# Patient Record
Sex: Male | Born: 1940
Health system: Southern US, Community
[De-identification: ages and names within clinical notes are randomized; demographics above are authoritative.]

## PROBLEM LIST (undated history)

## (undated) DIAGNOSIS — I517 Cardiomegaly: Secondary | ICD-10-CM

## (undated) DIAGNOSIS — K219 Gastro-esophageal reflux disease without esophagitis: Secondary | ICD-10-CM

## (undated) DIAGNOSIS — L301 Dyshidrosis [pompholyx]: Secondary | ICD-10-CM

## (undated) DIAGNOSIS — J45909 Unspecified asthma, uncomplicated: Secondary | ICD-10-CM

## (undated) DIAGNOSIS — N401 Enlarged prostate with lower urinary tract symptoms: Secondary | ICD-10-CM

## (undated) DIAGNOSIS — K5730 Diverticulosis of large intestine without perforation or abscess without bleeding: Secondary | ICD-10-CM

## (undated) DIAGNOSIS — I872 Venous insufficiency (chronic) (peripheral): Secondary | ICD-10-CM

## (undated) DIAGNOSIS — K648 Other hemorrhoids: Secondary | ICD-10-CM

## (undated) DIAGNOSIS — M5382 Other specified dorsopathies, cervical region: Secondary | ICD-10-CM

## (undated) DIAGNOSIS — M1990 Unspecified osteoarthritis, unspecified site: Secondary | ICD-10-CM

## (undated) DIAGNOSIS — E785 Hyperlipidemia, unspecified: Secondary | ICD-10-CM

## (undated) DIAGNOSIS — I6529 Occlusion and stenosis of unspecified carotid artery: Secondary | ICD-10-CM

## (undated) DIAGNOSIS — I2510 Atherosclerotic heart disease of native coronary artery without angina pectoris: Secondary | ICD-10-CM

## (undated) DIAGNOSIS — I10 Essential (primary) hypertension: Secondary | ICD-10-CM

## (undated) DIAGNOSIS — J309 Allergic rhinitis, unspecified: Secondary | ICD-10-CM

## (undated) DIAGNOSIS — E669 Obesity, unspecified: Secondary | ICD-10-CM

## (undated) HISTORY — DX: Unspecified asthma, uncomplicated: J45.909

## (undated) HISTORY — PX: FOOT SURGERY: SHX648

## (undated) HISTORY — DX: Diverticulosis of large intestine without perforation or abscess without bleeding: K57.30

## (undated) HISTORY — DX: Other hemorrhoids: K64.8

## (undated) HISTORY — DX: Unspecified osteoarthritis, unspecified site: M19.90

## (undated) HISTORY — DX: Gastro-esophageal reflux disease without esophagitis: K21.9

## (undated) HISTORY — PX: TONSILLECTOMY: SHX5217

## (undated) HISTORY — DX: Cardiomegaly: I51.7

## (undated) HISTORY — DX: Dyshidrosis (pompholyx): L30.1

## (undated) HISTORY — PX: JOINT REPLACEMENT: SHX530

## (undated) HISTORY — DX: Benign prostatic hyperplasia with lower urinary tract symptoms: N40.1

## (undated) HISTORY — DX: Allergic rhinitis, unspecified: J30.9

## (undated) HISTORY — DX: Obesity, unspecified: E66.9

## (undated) HISTORY — DX: Venous insufficiency (chronic) (peripheral): I87.2

## (undated) HISTORY — PX: ANGIOPLASTY: SHX39

## (undated) HISTORY — DX: Hyperlipidemia, unspecified: E78.5

## (undated) HISTORY — DX: Essential (primary) hypertension: I10

## (undated) HISTORY — DX: Other specified dorsopathies, cervical region: M53.82

## (undated) HISTORY — DX: Occlusion and stenosis of unspecified carotid artery: I65.29

## (undated) HISTORY — DX: Atherosclerotic heart disease of native coronary artery without angina pectoris: I25.10

---

## 1997-08-26 ENCOUNTER — Encounter: Admission: RE | Admit: 1997-08-26 | Discharge: 1997-11-24 | Payer: Self-pay

## 1998-11-01 ENCOUNTER — Inpatient Hospital Stay (HOSPITAL_COMMUNITY): Admission: AD | Admit: 1998-11-01 | Discharge: 1998-11-03 | Payer: Self-pay | Admitting: Cardiovascular Disease

## 1998-11-07 ENCOUNTER — Emergency Department (HOSPITAL_COMMUNITY): Admission: EM | Admit: 1998-11-07 | Discharge: 1998-11-07 | Payer: Self-pay | Admitting: Emergency Medicine

## 1999-05-06 ENCOUNTER — Inpatient Hospital Stay (HOSPITAL_COMMUNITY): Admission: AD | Admit: 1999-05-06 | Discharge: 1999-05-10 | Payer: Self-pay | Admitting: Cardiology

## 1999-05-24 ENCOUNTER — Ambulatory Visit (HOSPITAL_BASED_OUTPATIENT_CLINIC_OR_DEPARTMENT_OTHER): Admission: RE | Admit: 1999-05-24 | Discharge: 1999-05-24 | Payer: Self-pay | Admitting: Orthopedic Surgery

## 1999-09-22 ENCOUNTER — Ambulatory Visit (HOSPITAL_COMMUNITY): Admission: RE | Admit: 1999-09-22 | Discharge: 1999-09-23 | Payer: Self-pay | Admitting: Cardiology

## 2000-07-18 ENCOUNTER — Ambulatory Visit (HOSPITAL_COMMUNITY): Admission: RE | Admit: 2000-07-18 | Discharge: 2000-07-18 | Payer: Self-pay | Admitting: Cardiology

## 2001-03-27 LAB — HM COLONOSCOPY

## 2001-05-20 ENCOUNTER — Encounter: Payer: Self-pay | Admitting: Gastroenterology

## 2001-05-20 DIAGNOSIS — K5730 Diverticulosis of large intestine without perforation or abscess without bleeding: Secondary | ICD-10-CM

## 2001-05-20 DIAGNOSIS — K648 Other hemorrhoids: Secondary | ICD-10-CM | POA: Insufficient documentation

## 2001-05-20 HISTORY — DX: Diverticulosis of large intestine without perforation or abscess without bleeding: K57.30

## 2001-05-20 HISTORY — DX: Other hemorrhoids: K64.8

## 2001-12-20 ENCOUNTER — Encounter: Admission: RE | Admit: 2001-12-20 | Discharge: 2001-12-20 | Payer: Self-pay | Admitting: Family Medicine

## 2001-12-20 ENCOUNTER — Encounter: Payer: Self-pay | Admitting: Family Medicine

## 2003-05-29 ENCOUNTER — Encounter: Admission: RE | Admit: 2003-05-29 | Discharge: 2003-05-29 | Payer: Self-pay | Admitting: Family Medicine

## 2004-02-10 ENCOUNTER — Ambulatory Visit: Payer: Self-pay | Admitting: Family Medicine

## 2004-05-24 ENCOUNTER — Ambulatory Visit: Payer: Self-pay | Admitting: Family Medicine

## 2005-02-02 ENCOUNTER — Ambulatory Visit: Payer: Self-pay | Admitting: Family Medicine

## 2005-03-24 ENCOUNTER — Ambulatory Visit: Payer: Self-pay | Admitting: Family Medicine

## 2005-03-28 ENCOUNTER — Ambulatory Visit: Payer: Self-pay | Admitting: Family Medicine

## 2005-05-01 ENCOUNTER — Ambulatory Visit: Payer: Self-pay | Admitting: Family Medicine

## 2005-06-27 ENCOUNTER — Ambulatory Visit: Payer: Self-pay | Admitting: Internal Medicine

## 2005-07-11 ENCOUNTER — Ambulatory Visit: Payer: Self-pay | Admitting: Cardiology

## 2005-07-11 ENCOUNTER — Ambulatory Visit: Payer: Self-pay | Admitting: Family Medicine

## 2005-07-11 ENCOUNTER — Observation Stay (HOSPITAL_COMMUNITY): Admission: AD | Admit: 2005-07-11 | Discharge: 2005-07-13 | Payer: Self-pay | Admitting: Family Medicine

## 2005-07-13 ENCOUNTER — Ambulatory Visit: Payer: Self-pay | Admitting: Family Medicine

## 2005-07-17 ENCOUNTER — Ambulatory Visit: Payer: Self-pay | Admitting: Family Medicine

## 2006-02-07 ENCOUNTER — Ambulatory Visit: Payer: Self-pay | Admitting: Family Medicine

## 2006-04-12 ENCOUNTER — Ambulatory Visit: Payer: Self-pay | Admitting: Family Medicine

## 2006-04-12 LAB — CONVERTED CEMR LAB
AST: 23 units/L (ref 0–37)
Albumin: 3.7 g/dL (ref 3.5–5.2)
Alkaline Phosphatase: 58 units/L (ref 39–117)
BUN: 18 mg/dL (ref 6–23)
Cholesterol: 161 mg/dL (ref 0–200)
Creatinine, Ser: 0.9 mg/dL (ref 0.4–1.5)
Eosinophils Relative: 2 % (ref 0.0–5.0)
HCT: 45.5 % (ref 39.0–52.0)
Hemoglobin: 14.3 g/dL (ref 13.0–17.0)
Hgb A1c MFr Bld: 5.9 % (ref 4.6–6.0)
Neutrophils Relative %: 47.9 % (ref 43.0–77.0)
PSA: 0.26 ng/mL (ref 0.10–4.00)
Potassium: 3.9 meq/L (ref 3.5–5.1)
RDW: 12.6 % (ref 11.5–14.6)
Total Bilirubin: 1 mg/dL (ref 0.3–1.2)
Total Protein: 6.7 g/dL (ref 6.0–8.3)
Triglycerides: 78 mg/dL (ref 0–149)

## 2006-04-19 ENCOUNTER — Ambulatory Visit: Payer: Self-pay | Admitting: Family Medicine

## 2006-06-01 ENCOUNTER — Ambulatory Visit: Payer: Self-pay | Admitting: Family Medicine

## 2006-12-03 DIAGNOSIS — I2510 Atherosclerotic heart disease of native coronary artery without angina pectoris: Secondary | ICD-10-CM

## 2006-12-03 DIAGNOSIS — E785 Hyperlipidemia, unspecified: Secondary | ICD-10-CM

## 2006-12-03 DIAGNOSIS — I10 Essential (primary) hypertension: Secondary | ICD-10-CM

## 2006-12-03 HISTORY — DX: Hyperlipidemia, unspecified: E78.5

## 2006-12-03 HISTORY — DX: Atherosclerotic heart disease of native coronary artery without angina pectoris: I25.10

## 2006-12-04 DIAGNOSIS — I872 Venous insufficiency (chronic) (peripheral): Secondary | ICD-10-CM

## 2006-12-04 HISTORY — DX: Venous insufficiency (chronic) (peripheral): I87.2

## 2006-12-27 DIAGNOSIS — J45909 Unspecified asthma, uncomplicated: Secondary | ICD-10-CM | POA: Insufficient documentation

## 2007-01-03 ENCOUNTER — Ambulatory Visit: Payer: Self-pay | Admitting: Family Medicine

## 2007-01-10 ENCOUNTER — Ambulatory Visit: Payer: Self-pay | Admitting: Family Medicine

## 2007-01-11 ENCOUNTER — Ambulatory Visit: Payer: Self-pay | Admitting: Gastroenterology

## 2007-02-03 ENCOUNTER — Ambulatory Visit: Payer: Self-pay | Admitting: Internal Medicine

## 2007-02-05 ENCOUNTER — Ambulatory Visit: Payer: Self-pay | Admitting: Gastroenterology

## 2007-02-05 ENCOUNTER — Encounter: Payer: Self-pay | Admitting: Family Medicine

## 2007-03-20 ENCOUNTER — Ambulatory Visit: Payer: Self-pay | Admitting: Internal Medicine

## 2007-03-20 DIAGNOSIS — J209 Acute bronchitis, unspecified: Secondary | ICD-10-CM | POA: Insufficient documentation

## 2007-04-22 ENCOUNTER — Ambulatory Visit: Payer: Self-pay | Admitting: Family Medicine

## 2007-04-22 DIAGNOSIS — J309 Allergic rhinitis, unspecified: Secondary | ICD-10-CM

## 2007-04-22 HISTORY — DX: Allergic rhinitis, unspecified: J30.9

## 2007-05-30 DIAGNOSIS — I517 Cardiomegaly: Secondary | ICD-10-CM

## 2007-05-30 HISTORY — DX: Cardiomegaly: I51.7

## 2007-06-24 ENCOUNTER — Encounter: Payer: Self-pay | Admitting: Family Medicine

## 2007-06-25 ENCOUNTER — Ambulatory Visit: Payer: Self-pay | Admitting: Family Medicine

## 2007-06-25 DIAGNOSIS — M1990 Unspecified osteoarthritis, unspecified site: Secondary | ICD-10-CM | POA: Insufficient documentation

## 2007-06-25 DIAGNOSIS — N401 Enlarged prostate with lower urinary tract symptoms: Secondary | ICD-10-CM

## 2007-06-25 DIAGNOSIS — L301 Dyshidrosis [pompholyx]: Secondary | ICD-10-CM

## 2007-06-25 DIAGNOSIS — N138 Other obstructive and reflux uropathy: Secondary | ICD-10-CM | POA: Insufficient documentation

## 2007-06-25 HISTORY — DX: Other obstructive and reflux uropathy: N13.8

## 2007-06-25 HISTORY — DX: Dyshidrosis (pompholyx): L30.1

## 2007-06-25 HISTORY — DX: Unspecified osteoarthritis, unspecified site: M19.90

## 2007-06-25 HISTORY — DX: Benign prostatic hyperplasia with lower urinary tract symptoms: N40.1

## 2007-06-25 LAB — CONVERTED CEMR LAB
Albumin: 3.7 g/dL (ref 3.5–5.2)
Bilirubin Urine: NEGATIVE
Bilirubin, Direct: 0.1 mg/dL (ref 0.0–0.3)
Calcium: 9.5 mg/dL (ref 8.4–10.5)
Cholesterol: 138 mg/dL (ref 0–200)
Creatinine, Ser: 0.8 mg/dL (ref 0.4–1.5)
GFR calc Af Amer: 124 mL/min
Glucose, Bld: 95 mg/dL (ref 70–99)
Glucose, Urine, Semiquant: NEGATIVE
HCT: 45.7 % (ref 39.0–52.0)
Hemoglobin: 14.7 g/dL (ref 13.0–17.0)
Ketones, urine, test strip: NEGATIVE
MCHC: 32.2 g/dL (ref 30.0–36.0)
Monocytes Absolute: 1 10*3/uL (ref 0.1–1.0)
Monocytes Relative: 13.4 % — ABNORMAL HIGH (ref 3.0–12.0)
Neutro Abs: 4.1 10*3/uL (ref 1.4–7.7)
PSA: 0.07 ng/mL — ABNORMAL LOW (ref 0.10–4.00)
RDW: 13.1 % (ref 11.5–14.6)
Sodium: 140 meq/L (ref 135–145)
TSH: 1.15 microintl units/mL (ref 0.35–5.50)
Total CHOL/HDL Ratio: 3
Total Protein: 6.5 g/dL (ref 6.0–8.3)
Triglycerides: 68 mg/dL (ref 0–149)
pH: 5.5

## 2007-06-27 ENCOUNTER — Telehealth (INDEPENDENT_AMBULATORY_CARE_PROVIDER_SITE_OTHER): Payer: Self-pay | Admitting: *Deleted

## 2007-07-02 ENCOUNTER — Telehealth: Payer: Self-pay | Admitting: Family Medicine

## 2007-08-13 ENCOUNTER — Telehealth: Payer: Self-pay | Admitting: Family Medicine

## 2007-10-02 ENCOUNTER — Ambulatory Visit: Payer: Self-pay | Admitting: Cardiology

## 2007-10-10 ENCOUNTER — Ambulatory Visit: Payer: Self-pay

## 2007-10-10 LAB — CONVERTED CEMR LAB
Calcium: 9.1 mg/dL (ref 8.4–10.5)
GFR calc Af Amer: 108 mL/min
GFR calc non Af Amer: 89 mL/min
Sodium: 136 meq/L (ref 135–145)

## 2007-10-22 ENCOUNTER — Ambulatory Visit: Payer: Self-pay | Admitting: Cardiology

## 2007-10-22 LAB — CONVERTED CEMR LAB
Calcium: 9.3 mg/dL (ref 8.4–10.5)
GFR calc Af Amer: 108 mL/min
GFR calc non Af Amer: 89 mL/min
Potassium: 4.2 meq/L (ref 3.5–5.1)
Sodium: 139 meq/L (ref 135–145)

## 2007-11-06 ENCOUNTER — Ambulatory Visit: Payer: Self-pay | Admitting: Pulmonary Disease

## 2007-11-06 DIAGNOSIS — R0609 Other forms of dyspnea: Secondary | ICD-10-CM

## 2007-11-06 DIAGNOSIS — R0989 Other specified symptoms and signs involving the circulatory and respiratory systems: Secondary | ICD-10-CM

## 2007-12-06 ENCOUNTER — Ambulatory Visit: Payer: Self-pay | Admitting: Internal Medicine

## 2007-12-06 DIAGNOSIS — K219 Gastro-esophageal reflux disease without esophagitis: Secondary | ICD-10-CM | POA: Insufficient documentation

## 2007-12-06 HISTORY — DX: Gastro-esophageal reflux disease without esophagitis: K21.9

## 2007-12-25 ENCOUNTER — Ambulatory Visit: Payer: Self-pay | Admitting: Family Medicine

## 2008-01-22 ENCOUNTER — Ambulatory Visit: Payer: Self-pay | Admitting: Family Medicine

## 2008-02-10 ENCOUNTER — Ambulatory Visit: Payer: Self-pay | Admitting: Cardiology

## 2008-05-14 ENCOUNTER — Ambulatory Visit: Payer: Self-pay | Admitting: Family Medicine

## 2008-06-18 ENCOUNTER — Ambulatory Visit: Payer: Self-pay | Admitting: Family Medicine

## 2008-06-18 LAB — CONVERTED CEMR LAB
ALT: 21 units/L (ref 0–53)
Albumin: 3.6 g/dL (ref 3.5–5.2)
BUN: 18 mg/dL (ref 6–23)
Basophils Relative: 0.1 % (ref 0.0–3.0)
Bilirubin Urine: NEGATIVE
Bilirubin, Direct: 0 mg/dL (ref 0.0–0.3)
CO2: 29 meq/L (ref 19–32)
Chloride: 104 meq/L (ref 96–112)
Cholesterol: 157 mg/dL (ref 0–200)
Creatinine, Ser: 0.8 mg/dL (ref 0.4–1.5)
Eosinophils Absolute: 0.2 10*3/uL (ref 0.0–0.7)
Eosinophils Relative: 2.5 % (ref 0.0–5.0)
HCT: 43.6 % (ref 39.0–52.0)
Ketones, urine, test strip: NEGATIVE
Lymphs Abs: 2.2 10*3/uL (ref 0.7–4.0)
MCHC: 33.8 g/dL (ref 30.0–36.0)
MCV: 87.5 fL (ref 78.0–100.0)
Monocytes Absolute: 0.8 10*3/uL (ref 0.1–1.0)
Neutrophils Relative %: 49.1 % (ref 43.0–77.0)
PSA: 0.26 ng/mL (ref 0.10–4.00)
Potassium: 4.1 meq/L (ref 3.5–5.1)
RBC: 4.98 M/uL (ref 4.22–5.81)
Specific Gravity, Urine: 1.02
TSH: 0.61 microintl units/mL (ref 0.35–5.50)
Total CHOL/HDL Ratio: 3
Total Protein: 6.7 g/dL (ref 6.0–8.3)
Triglycerides: 68 mg/dL (ref 0.0–149.0)
WBC: 6.2 10*3/uL (ref 4.5–10.5)

## 2008-07-23 ENCOUNTER — Encounter: Payer: Self-pay | Admitting: Cardiology

## 2008-07-23 ENCOUNTER — Ambulatory Visit: Payer: Self-pay | Admitting: Family Medicine

## 2008-12-05 DIAGNOSIS — E669 Obesity, unspecified: Secondary | ICD-10-CM

## 2008-12-05 HISTORY — DX: Obesity, unspecified: E66.9

## 2008-12-22 ENCOUNTER — Ambulatory Visit: Payer: Self-pay | Admitting: Cardiology

## 2009-02-04 ENCOUNTER — Ambulatory Visit: Payer: Self-pay | Admitting: Cardiology

## 2009-02-10 ENCOUNTER — Telehealth: Payer: Self-pay | Admitting: Cardiology

## 2009-02-10 LAB — CONVERTED CEMR LAB
ALT: 23 units/L (ref 0–53)
AST: 24 units/L (ref 0–37)
Alkaline Phosphatase: 52 units/L (ref 39–117)
Bilirubin, Direct: 0 mg/dL (ref 0.0–0.3)
Cholesterol: 158 mg/dL (ref 0–200)
Total Bilirubin: 1.2 mg/dL (ref 0.3–1.2)
Total Protein: 6.9 g/dL (ref 6.0–8.3)

## 2009-07-22 ENCOUNTER — Ambulatory Visit: Payer: Self-pay | Admitting: Family Medicine

## 2009-07-22 LAB — CONVERTED CEMR LAB
ALT: 20 units/L (ref 0–53)
AST: 24 units/L (ref 0–37)
BUN: 27 mg/dL — ABNORMAL HIGH (ref 6–23)
Basophils Relative: 0.2 % (ref 0.0–3.0)
Bilirubin Urine: NEGATIVE
Bilirubin, Direct: 0.1 mg/dL (ref 0.0–0.3)
Cholesterol: 164 mg/dL (ref 0–200)
Creatinine, Ser: 1.1 mg/dL (ref 0.4–1.5)
Eosinophils Relative: 3.6 % (ref 0.0–5.0)
GFR calc non Af Amer: 70.13 mL/min (ref >60)
HCT: 42.8 % (ref 39.0–52.0)
LDL Cholesterol: 90 mg/dL (ref 0–99)
Monocytes Relative: 13.1 % — ABNORMAL HIGH (ref 3.0–12.0)
Neutrophils Relative %: 51.2 % (ref 43.0–77.0)
Nitrite: NEGATIVE
Platelets: 151 10*3/uL (ref 150.0–400.0)
Potassium: 4.7 meq/L (ref 3.5–5.1)
Protein, U semiquant: NEGATIVE
RBC: 4.79 M/uL (ref 4.22–5.81)
Total Bilirubin: 0.8 mg/dL (ref 0.3–1.2)
Total CHOL/HDL Ratio: 3
Total Protein: 6.7 g/dL (ref 6.0–8.3)
Urobilinogen, UA: 0.2
VLDL: 19.4 mg/dL (ref 0.0–40.0)
WBC: 6.9 10*3/uL (ref 4.5–10.5)

## 2009-07-30 ENCOUNTER — Ambulatory Visit: Payer: Self-pay | Admitting: Family Medicine

## 2009-07-30 LAB — CONVERTED CEMR LAB: HDL goal, serum: 40 mg/dL

## 2009-10-14 ENCOUNTER — Ambulatory Visit: Payer: Self-pay | Admitting: Family Medicine

## 2009-10-14 DIAGNOSIS — M5382 Other specified dorsopathies, cervical region: Secondary | ICD-10-CM

## 2009-10-14 HISTORY — DX: Other specified dorsopathies, cervical region: M53.82

## 2009-10-21 ENCOUNTER — Encounter
Admission: RE | Admit: 2009-10-21 | Discharge: 2009-12-17 | Payer: Self-pay | Source: Home / Self Care | Admitting: Family Medicine

## 2009-10-25 ENCOUNTER — Encounter: Payer: Self-pay | Admitting: Family Medicine

## 2009-11-18 ENCOUNTER — Ambulatory Visit: Payer: Self-pay | Admitting: Cardiology

## 2009-12-27 ENCOUNTER — Ambulatory Visit: Payer: Self-pay | Admitting: Cardiology

## 2010-01-28 ENCOUNTER — Telehealth: Payer: Self-pay | Admitting: Cardiology

## 2010-01-28 LAB — CONVERTED CEMR LAB
ALT: 19 units/L (ref 0–53)
Alkaline Phosphatase: 54 units/L (ref 39–117)
Bilirubin, Direct: 0.1 mg/dL (ref 0.0–0.3)
HDL: 49.3 mg/dL (ref >39.00)
LDL Cholesterol: 105 mg/dL — ABNORMAL HIGH (ref 0–99)
Total Bilirubin: 0.8 mg/dL (ref 0.3–1.2)
Total CHOL/HDL Ratio: 4
Total Protein: 6.7 g/dL (ref 6.0–8.3)
Triglycerides: 154 mg/dL — ABNORMAL HIGH (ref 0.0–149.0)

## 2010-03-15 ENCOUNTER — Ambulatory Visit: Payer: Self-pay | Admitting: Family Medicine

## 2010-04-22 ENCOUNTER — Telehealth: Payer: Self-pay | Admitting: Family Medicine

## 2010-04-25 ENCOUNTER — Telehealth: Payer: Self-pay | Admitting: Cardiology

## 2010-04-26 NOTE — Assessment & Plan Note (Signed)
Summary: neck pain 6 month/njr   Vital Signs:  Patient profile:   70 year old male Weight:      252 pounds Temp:     97.7 degrees F oral BP sitting:   100 / 70  (left arm) Cuff size:   regular  Vitals Entered By: Kathrynn Speed CMA (October 14, 2009 10:28 AM) CC: Neck pain for 2 months, src   CC:  Neck pain for 2 months and src.  History of Present Illness: Hunter Young is a 70 year old, married male, nonsmoker, who comes in today for evaluation of left neck pain x 6 months.  Impression 6 months ago he began having pain in the left side of his neck.  Now the pain is constant and varies in type from sharp to dull pain.  A3 on a scale of one to 10.  Occasionally it will radiate down his left arm.  No history of trauma to  Current Medications (verified): 1)  Toprol Xl 50 Mg  Tb24 (Metoprolol Succinate) .... 1/2 Every Morning 2)  Nitroglycerin 0.4 Mg  Subl (Nitroglycerin) .... As Needed 3)  Centrum Silver   Tabs (Multiple Vitamins-Minerals) .... Once Daily 4)  Omeprazole 20 Mg Cpdr (Omeprazole) .... Take One Tablet By Mouth Daily 5)  Adult Aspirin Ec Low Strength 81 Mg Tbec (Aspirin) .... Take One Tab Once Daily 6)  Oxybutynin Chloride 5 Mg Tabs (Oxybutynin Chloride) .... Take 1 Tablet By Mouth Two Times A Day 7)  Niacin 500 Mg Tabs (Niacin) .... Once Daily 8)  Lisinopril 40 Mg Tabs (Lisinopril) .... Take One Tablet By Mouth Daily 9)  Simvastatin 80 Mg Tabs (Simvastatin) .... Take One-Half  Tablet By Mouth Daily At Bedtime  Allergies (verified): No Known Drug Allergies  Past History:  Past medical, surgical, family and social histories (including risk factors) reviewed for relevance to current acute and chronic problems.  Past Medical History: Reviewed history from 12/05/2008 and no changes required. Current Problems:  CORONARY ARTERY DISEASE (ICD-414.00)-Nonobstructive HYPERLIPIDEMIA (ICD-272.4) HYPERTENSION (ICD-401.9) CARDIOMEGALY (ICD-429.3) GERD (ICD-530.81) SNORING  (ICD-786.09) OBESITY, MODERATE (ICD-278.00) DYSHIDROSIS (ICD-705.81) BENIGN PROSTATIC HYPERTROPHY, WITH URINARY OBSTRUCTION (ICD-600.01) OSTEOARTHRITIS (ICD-715.90) NEOPLASM, MALIGNANT, COLON, FAMILY HX, MOTHER (ICD-V16.0) INTERNAL HEMORRHOIDS (ICD-455.0) DIVERTICULOSIS OF COLON (ICD-562.10) ALLERGIC RHINITIS (ICD-477.9) BRONCHITIS, ACUTE (ICD-466.0) ASTHMA, INTRINSIC NOS (ICD-493.10) VENOUS INSUFFICIENCY (ICD-459.81)  Past Surgical History: Reviewed history from 12/05/2008 and no changes required.  bilateral knee replacement  foot surgery.  tonsillectomy.  prior PCI of his circumflex multiple times.  Family History: Reviewed history from 12/05/2008 and no changes required.   Positive for coronary artery disease.  Social History: Reviewed history from 12/05/2008 and no changes required. 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 Pt previously worked in the trucking business.  Married Former Smoker Alcohol use-no Drug use-no Regular exercise-no  Review of Systems      See HPI  Physical Exam  General:  Well-developed,well-nourished,in no acute distress; alert,appropriate and cooperative throughout examination Msk:  No deformity or scoliosis noted of thoracic or lumbar spine.   Pulses:  R and L carotid,radial,femoral,dorsalis pedis and posterior tibial pulses are full and equal bilaterally Extremities:  No clubbing, cyanosis, edema, or deformity noted with normal full range of motion of all joints.   Neurologic:  No cranial nerve deficits noted. Station and gait are normal. Plantar reflexes are down-going bilaterally. DTRs are symmetrical throughout. Sensory, motor and coordinative functions appear intact.   Problems:  Medical Problems Added: 1)  Dx of Cervical  Disorder, Nos  (ICD-723.9)  Impression & Recommendations:  Problem # 1:  CERVICAL DISORDER, NOS (ICD-723.9) Assessment New  Orders: Physical Therapy Referral  (PT) T-Cervical Spine Comp w/Flex & Ext ([id number])  Complete Medication List: 1)  Toprol Xl 50 Mg Tb24 (Metoprolol succinate) .... 1/2 every morning 2)  Nitroglycerin 0.4 Mg Subl (Nitroglycerin) .... As needed 3)  Centrum Silver Tabs (Multiple vitamins-minerals) .... Once daily 4)  Omeprazole 20 Mg Cpdr (Omeprazole) .... Take one tablet by mouth daily 5)  Adult Aspirin Ec Low Strength 81 Mg Tbec (Aspirin) .... Take one tab once daily 6)  Oxybutynin Chloride 5 Mg Tabs (Oxybutynin chloride) .... Take 1 tablet by mouth two times a day 7)  Niacin 500 Mg Tabs (Niacin) .... Once daily 8)  Lisinopril 40 Mg Tabs (Lisinopril) .... Take one tablet by mouth daily 9)  Simvastatin 80 Mg Tabs (Simvastatin) .... Take one-half  tablet by mouth daily at bedtime 10)  Flexeril 5 Mg Tabs (Cyclobenzaprine hcl) .Marland Kitchen.. 1 tab @ bedtime 11)  Vicodin Es 7.5-750 Mg Tabs (Hydrocodone-acetaminophen) .Marland Kitchen.. 1 tab @ bedtime  Patient Instructions: 1)  go  to the main office now for x-rays of your neck.  I will call you the report. 2)  purchase a soft cervical collar...Marland KitchenMarland KitchenMarland Kitchen wear it at bedtime....and take a half of Flexeril, and/or Vicodin at bedtime as needed for severe neck pain. 3)  We will also get u  set up for physical therapy Prescriptions: VICODIN ES 7.5-750 MG TABS (HYDROCODONE-ACETAMINOPHEN) 1 tab @ bedtime  #30 x 2   Entered and Authorized by:   Roderick Pee MD   Signed by:   Roderick Pee MD on 10/14/2009   Method used:   Print then Give to Patient   RxID:   [id number] FLEXERIL 5 MG TABS (CYCLOBENZAPRINE HCL) 1 tab @ bedtime  #30 x 2   Entered and Authorized by:   Roderick Pee MD   Signed by:   Roderick Pee MD on 10/14/2009   Method used:   Print then Give to Patient   RxID:   [id number]

## 2010-04-26 NOTE — Miscellaneous (Signed)
Summary: Initial Summary for PT Services/[facility]  Initial Summary for PT Services/[facility]   Imported By: Maryln Gottron 10/27/2009 09:05:03  _____________________________________________________________________  External Attachment:    Type:   Image     Comment:   External Document

## 2010-04-26 NOTE — Assessment & Plan Note (Signed)
Summary: f81m   Visit Type:  6 months follow up Referring Provider:  Shawnie Pons Primary Provider:  Roderick Pee MD  CC:  One episode of chest pain about 2 months ago.Marland Kitchen  History of Present Illness: Had one episode about two months ago, and none since.  He thinks he over did it.  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  He got very hot and it felt funny.  Had no unusual swelling in legs.  He may go to another company.  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    Current Medications (verified): 1)  Toprol Xl 50 Mg  Tb24 (Metoprolol Succinate) .... 1/2 Every Morning 2)  Nitroglycerin 0.4 Mg  Subl (Nitroglycerin) .... As Needed 3)  Centrum Silver   Tabs (Multiple Vitamins-Minerals) .... Once Daily 4)  Omeprazole 20 Mg Cpdr (Omeprazole) .... Take One Tablet By Mouth Daily 5)  Adult Aspirin Ec Low Strength 81 Mg Tbec (Aspirin) .... Take One Tab Once Daily 6)  Oxybutynin Chloride 5 Mg Tabs (Oxybutynin Chloride) .... Take 1 Tablet By Mouth Two Times A Day 7)  Niacin 500 Mg Tabs (Niacin) .... Once Daily 8)  Lisinopril 40 Mg Tabs (Lisinopril) .... Take One Tablet By Mouth Daily 9)  Simvastatin 80 Mg Tabs (Simvastatin) .... Take One-Half  Tablet By Mouth Daily At Bedtime  Allergies (verified): No Known Drug Allergies  Past History:  Past Medical History: Last updated: 12/05/2008 Current Problems:  CORONARY ARTERY DISEASE (ICD-414.00)-Nonobstructive HYPERLIPIDEMIA (ICD-272.4) HYPERTENSION (ICD-401.9) CARDIOMEGALY (ICD-429.3) GERD (ICD-530.81) SNORING (ICD-786.09) OBESITY, MODERATE (ICD-278.00) DYSHIDROSIS (ICD-705.81) BENIGN PROSTATIC HYPERTROPHY, WITH URINARY OBSTRUCTION (ICD-600.01) OSTEOARTHRITIS (ICD-715.90) NEOPLASM, MALIGNANT, COLON, FAMILY HX, MOTHER (ICD-V16.0) INTERNAL HEMORRHOIDS (ICD-455.0) DIVERTICULOSIS OF COLON (ICD-562.10) ALLERGIC RHINITIS (ICD-477.9) BRONCHITIS, ACUTE (ICD-466.0) ASTHMA, INTRINSIC NOS (ICD-493.10) VENOUS  INSUFFICIENCY (ICD-459.81)  Past Surgical History: Last updated: 12/05/2008  bilateral knee replacement  foot surgery.  tonsillectomy.  prior PCI of his circumflex multiple times.  Family History: Last updated: 12/05/2008   Positive for coronary artery disease.  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 Pt previously worked in the trucking business.  Married Former Smoker Alcohol use-no Drug use-no Regular exercise-no  Vital Signs:  Patient profile:   70 year old male Height:      66.25 inches Weight:      254 pounds BMI:     40.83 Pulse rate:   58 / minute Pulse rhythm:   regular Resp:     18 per minute BP sitting:   112 / 68  (left arm) Cuff size:   large  Vitals Entered By: Vikki Ports (November 18, 2009 9:14 AM)  Physical Exam  General:  Well developed, well nourished, in no acute distress. Head:  normocephalic and atraumatic Eyes:  PERRLA/EOM intact; conjunctiva and lids normal. Neck:  Neck supple, no JVD. No masses, thyromegaly or abnormal cervical nodes. Chest Wall:  no deformities or breast masses noted Lungs:  Clear bilaterally to auscultation and percussion. Heart:  PMII non displaced.  Normal S1 and S2.  No murmurs  or rub. Abdomen:  Bowel sounds positive; abdomen soft and non-tender without masses, organomegaly, or hernias noted. No hepatosplenomegaly. Msk:  Back normal, normal gait. Muscle strength and tone normal. Extremities:  No clubbing or cyanosis. Neurologic:  Alert and oriented x 3.   EKG  Procedure date:  11/18/2009  Findings:      SB.  Otherwise normal  Cardiac Cath  Procedure date:  07/12/2005  Findings:       CONCLUSIONS:  Non-obstructive coronary artery disease status post prior  percutaneous coronary interventions as described above with 40% narrowing in  the proximal left anterior descending, 0% stenosis at the stent site in the  mid  circumflex artery and 30% stenosis at the stent site in the distal  circumflex artery, 70-80% stenosis in the mid portion of a non-dominant  right coronary artery and minimal inferobasal wall hypokinesis with an  estimated ejection fraction of 60%.   RECOMMENDATIONS:  Patient has a non-obstructive coronary disease.  Etiology  of the symptoms not clear.  He has both dyspnea and chest tightness which  are somewhat impressive and I think we should rule out a pulmonary embolism.  We drew a D-dimer today and will plan to get a spiral CT scan with contrast  today or tomorrow.   CT Scan  Procedure date:  07/13/2005  Findings:       Findings:  Both lungs are clear of nodules, infiltrates, and   effusions.  No mediastinal, hilar, or axillary adenopathy are seen.   Cardiomegaly is noted which is unchanged compared with the 07/12/2003   chest x-ray. No filling defects are seen within the segmental or   subsegmental pulmonary arteries to suggest pulmonary embolus.   IMPRESSION:   1.  Cardiomegaly.   2.  Both lungs are clear.   3.  There is no evidence of pulmonary embolus.  Impression & Recommendations:  Problem # 1:  CORONARY ARTERY DISEASE (ICD-414.00) one episode of pain, none since.  Continue medical therapy.  Last cath as noted.   Call for any change His updated medication list for this problem includes:    Toprol Xl 50 Mg Tb24 (Metoprolol succinate) .Marland Kitchen... 1/2 every morning    Nitroglycerin 0.4 Mg Subl (Nitroglycerin) .Marland Kitchen... As needed    Adult Aspirin Ec Low Strength 81 Mg Tbec (Aspirin) .Marland Kitchen... Take one tab once daily    Lisinopril 40 Mg Tabs (Lisinopril) .Marland Kitchen... Take one tablet by mouth daily  Problem # 2:  HYPERLIPIDEMIA (ICD-272.4) Maxed out at highest dose.  Will stop Niacin, and see what his numbers do.  Weight loss encouraged.  Talked in detail.   The following medications were removed from the medication list:    Niacin 500 Mg Tabs (Niacin) ..... Once daily His updated medication  list for this problem includes:    Simvastatin 80 Mg Tabs (Simvastatin) .Marland Kitchen... Take one-half  tablet by mouth daily at bedtime  Problem # 3:  HYPERTENSION (ICD-401.9)  controlled His updated medication list for this problem includes:    Toprol Xl 50 Mg Tb24 (Metoprolol succinate) .Marland Kitchen... 1/2 every morning    Adult Aspirin Ec Low Strength 81 Mg Tbec (Aspirin) .Marland Kitchen... Take one tab once daily    Lisinopril 40 Mg Tabs (Lisinopril) .Marland Kitchen... Take one tablet by mouth daily  His updated medication list for this problem includes:    Toprol Xl 50 Mg Tb24 (Metoprolol succinate) .Marland Kitchen... 1/2 every morning    Adult Aspirin Ec Low Strength 81 Mg Tbec (Aspirin) .Marland Kitchen... Take one tab once daily    Lisinopril 40 Mg Tabs (Lisinopril) .Marland Kitchen... Take one tablet by mouth daily  Other Orders: EKG w/ Interpretation (93000)  Patient Instructions: 1)  Your physician recommends that you return for a FASTING lipid profile and liver profile: 6 weeks. Nothing to eat or drink after midnight. (414.01, 401.9, 272.0) 2)  Your physician has recommended you make  the following change in your medication: Stop niacin. 3)  Your physician wants you to follow-up in: 1 year   You will receive a reminder letter in the mail two months in advance. If you don't receive a letter, please call our office to schedule the follow-up appointment.

## 2010-04-26 NOTE — Assessment & Plan Note (Signed)
Summary: cpx/cjr/pt rsc/cjr   Vital Signs:  Patient profile:   70 year old male Height:      66.25 inches Weight:      254 pounds BMI:     40.83 Temp:     97.7 degrees F oral BP sitting:   140 / 80  (left arm) Cuff size:   regular  Vitals Entered By: Kern Reap CMA Duncan Dull) (Jul 30, 2009 11:48 AM) CC: cpx, Lipid Management   CC:  cpx and Lipid Management.  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  Multiple issues.  His underlying hypertension, for which he takes Toprol 50 mg dose one half tab q.a.m., lisinopril 40 mg daily BP 140/80.  His underlying coronary disease, clinically stable.  No chest pain.  He sees Dr. Riley Kill on a regular basis.  The last time Dr. Riley Kill saw him he cut his simvastatin dose from 80 to 40.  LDL 90 HDL 55 total cholesterol 164, triglycerides 97.  He takes oxybutynin 5 mg b.i.d. for BPH.  He takes omeprazole 20 mg daily for reflux esophagitis.  His past medical history, social history, family history given detailed.  No significant changes except he is back to work.  He continues to remain physically active, but is somewhat limited, because he's had total knee replacements, right and left.  His mood is good.  Hearing normal ADLs, normal fall risk.  Negligible home safety reviewed negative.  Height, weight, in vision, normal.  He gets annual eye exams.  Laboratory data will be reviewed.  EKG done by his cardiologist.  He also gets routine dental care, and a colonoscopy, which was normal.  Tetanus 2010 seasonal flu 2010 Pneumovax 2006.... information given on shingles  Lipid Management History:      Positive NCEP/ATP III risk factors include male age 66 years old or older, hypertension, and ASHD (either angina/prior MI/prior CABG).  Negative NCEP/ATP III risk factors include non-tobacco-user status.     Allergies: No Known Drug Allergies  Past History:  Past medical, surgical, family and social histories (including risk factors) reviewed, and no changes noted (except as noted below).  Past Medical History: Reviewed history from 12/05/2008 and no changes required. Current Problems:  CORONARY ARTERY DISEASE (ICD-414.00)-Nonobstructive HYPERLIPIDEMIA (ICD-272.4) HYPERTENSION (ICD-401.9) CARDIOMEGALY (ICD-429.3) GERD (ICD-530.81) SNORING (ICD-786.09) OBESITY, MODERATE (ICD-278.00) DYSHIDROSIS (ICD-705.81) BENIGN PROSTATIC HYPERTROPHY, WITH URINARY OBSTRUCTION (ICD-600.01) OSTEOARTHRITIS (ICD-715.90) NEOPLASM, MALIGNANT, COLON, FAMILY HX, MOTHER (ICD-V16.0) INTERNAL HEMORRHOIDS (ICD-455.0) DIVERTICULOSIS OF COLON (ICD-562.10) ALLERGIC RHINITIS (ICD-477.9) BRONCHITIS, ACUTE (ICD-466.0) ASTHMA, INTRINSIC NOS (ICD-493.10) VENOUS INSUFFICIENCY (ICD-459.81)  Past Surgical History: Reviewed history from 12/05/2008 and no changes required.  bilateral knee replacement  foot surgery.  tonsillectomy.  prior PCI of his circumflex multiple times.  Family History: Reviewed history from 12/05/2008 and no changes required.   Positive for coronary artery disease.  Social History: Reviewed history from 12/05/2008 and no changes required. 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 Pt previously worked in the trucking business.  Married Former Smoker Alcohol use-no Drug use-no Regular exercise-no  Review of Systems      See HPI  Physical Exam  General:  Well-developed,well-nourished,in no acute distress; alert,appropriate and cooperative throughout examination Head:  Normocephalic and atraumatic without obvious abnormalities. No apparent alopecia or balding. Eyes:  No corneal or conjunctival inflammation noted. EOMI. Perrla. Funduscopic exam  benign, without hemorrhages, exudates or papilledema. Vision grossly normal. Ears:   External ear exam shows no significant lesions or deformities.  Otoscopic examination reveals clear canals, tympanic membranes are intact bilaterally without bulging, retraction, inflammation or discharge. Hearing is grossly normal bilaterally. Nose:  External nasal examination shows no deformity or inflammation. Nasal mucosa are pink and moist without lesions or exudates. Mouth:  Oral mucosa and oropharynx without lesions or exudates.  Teeth in good repair. Neck:  No deformities, masses, or tenderness noted. Chest Wall:  No deformities, masses, tenderness or gynecomastia noted. Breasts:  No masses or gynecomastia noted Lungs:  Normal respiratory effort, chest expands symmetrically. Lungs are clear to auscultation, no crackles or wheezes. Heart:  Normal rate and regular rhythm. S1 and S2 normal without gallop, murmur, click, rub or other extra sounds. Abdomen:  Bowel sounds positive,abdomen soft and non-tender without masses, organomegaly or hernias noted. Rectal:  No external abnormalities noted. Normal sphincter tone. No rectal masses or tenderness. Genitalia:  Testes bilaterally descended without nodularity, tenderness or masses. No scrotal masses or lesions. No penis lesions or urethral discharge. Prostate:  no asymmetry and 2+ enlarged.   Msk:  No deformity or scoliosis noted of thoracic or lumbar spine.   Pulses:  R and L carotid,radial,femoral,dorsalis pedis and posterior tibial pulses are full and equal bilaterally Extremities:  No clubbing, cyanosis, edema, or deformity noted with normal full range of motion of all joints.   Neurologic:  No cranial nerve deficits noted. Station and gait are normal. Plantar reflexes are down-going bilaterally. DTRs are symmetrical throughout. Sensory, motor and coordinative functions appear intact. Skin:  Intact without suspicious lesions or rashes Cervical Nodes:  No lymphadenopathy noted Axillary Nodes:  No palpable lymphadenopathy Inguinal Nodes:  No  significant adenopathy Psych:  Cognition and judgment appear intact. Alert and cooperative with normal attention span and concentration. No apparent delusions, illusions, hallucinations   Impression & Recommendations:  Problem # 1:  HYPERLIPIDEMIA (ICD-272.4) Assessment Improved  His updated medication list for this problem includes:    Niacin 500 Mg Tabs (Niacin) ..... Once daily    Simvastatin 80 Mg Tabs (Simvastatin) .Marland Kitchen... Take one-half  tablet by mouth daily at bedtime  Orders: Prescription Created Electronically [phone])  Problem # 2:  HYPERTENSION (ICD-401.9) Assessment: Improved  His updated medication list for this problem includes:    Toprol Xl 50 Mg Tb24 (Metoprolol succinate) .Marland Kitchen... 1/2 every morning    Lisinopril 40 Mg Tabs (Lisinopril) .Marland Kitchen... Take one tablet by mouth daily  Orders: Prescription Created Electronically [phone])  Problem # 3:  GERD (ICD-530.81) Assessment: Improved  His updated medication list for this problem includes:    Omeprazole 20 Mg Cpdr (Omeprazole) .Marland Kitchen... Take one tablet by mouth daily  Orders: Prescription Created Electronically [phone])  Problem # 4:  OBESITY, MODERATE (ICD-278.00) Assessment: Unchanged  Problem # 5:  BENIGN PROSTATIC HYPERTROPHY, WITH URINARY OBSTRUCTION (ICD-600.01) Assessment: Unchanged  Orders: Prescription Created Electronically [phone])  Complete Medication List: 1)  Toprol Xl 50 Mg Tb24 (Metoprolol succinate) .... 1/2 every morning 2)  Nitroglycerin 0.4 Mg Subl (Nitroglycerin) .... As needed 3)  Centrum Silver Tabs (Multiple vitamins-minerals) .... Once daily 4)  Omeprazole 20 Mg Cpdr (Omeprazole) .... Take one tablet by mouth daily 5)  Adult Aspirin Ec Low Strength 81 Mg Tbec (Aspirin) .... Take one tab once daily 6)  Oxybutynin Chloride 5 Mg Tabs (Oxybutynin chloride) .... Take 1 tablet by mouth two times a day 7)  Niacin 500 Mg Tabs (Niacin) .... Once  daily 8)  Lisinopril 40 Mg Tabs (Lisinopril) .... Take  one tablet by mouth daily 9)  Simvastatin 80 Mg Tabs (Simvastatin) .... Take one-half  tablet by mouth daily at bedtime  Lipid Assessment/Plan:      Based on NCEP/ATP III, the patient's risk factor category is "history of coronary disease, peripheral vascular disease, cerebrovascular disease, or aortic aneurysm".  The patient's lipid goals are as follows: Total cholesterol goal is 200; LDL cholesterol goal is 100; HDL cholesterol goal is 40; Triglyceride goal is 150.     Patient Instructions: 1)  Please schedule a follow-up appointment in 1 year. 2)  It is important that you exercise regularly at least 20 minutes 5 times a week. If you develop chest pain, have severe difficulty breathing, or feel very tired , stop exercising immediately and seek medical attention. Prescriptions: NIACIN 500 MG TABS (NIACIN) once daily  #100 x 4   Entered and Authorized by:   Roderick Pee MD   Signed by:   Roderick Pee MD on 07/30/2009   Method used:   Electronically to        [facility] [phone]* (retail)       [address]       [place], [place]  [address]       Ph: [phone]       Fax:    RxID:   [id number] OXYBUTYNIN CHLORIDE 5 MG TABS (OXYBUTYNIN CHLORIDE) Take 1 tablet by mouth two times a day  #200 x 4   Entered and Authorized by:   Roderick Pee MD   Signed by:   Roderick Pee MD on 07/30/2009   Method used:   Electronically to        [facility] [phone]* (retail)       [address]       [place], [place]  [address]       Ph: [phone]       Fax:    RxID:   [id number] SIMVASTATIN 80 MG TABS (SIMVASTATIN) Take one-half  tablet by mouth daily at bedtime  #100 x 4   Entered and Authorized by:   Roderick Pee MD   Signed by:   Roderick Pee MD on 07/30/2009   Method used:   Electronically to        [facility] [phone]* (retail)       [address]       [place], [place]  [address]       Ph: [phone]       Fax:    RxID:   [id number] LISINOPRIL 40 MG TABS (LISINOPRIL) Take one  tablet by mouth daily  #100 x 4   Entered and Authorized by:   Roderick Pee MD   Signed by:   Roderick Pee MD on 07/30/2009   Method used:   Electronically to        [facility] [phone]* (retail)       [address]       [place], [place]  [address]       Ph: [phone]       Fax:    RxID:   [id number] OMEPRAZOLE 20 MG CPDR (OMEPRAZOLE) take one tablet by mouth daily  #100 x 4   Entered and Authorized by:   Roderick Pee MD   Signed by:   Roderick Pee MD on 07/30/2009   Method used:   Electronically to  [facility] [phone]* (retail)       [address]       [place], [place]  [address]       Ph: [phone]       Fax:    RxID:   [id number] NITROGLYCERIN 0.4 MG  SUBL (NITROGLYCERIN) as needed  #25 x 1   Entered and Authorized by:   Roderick Pee MD   Signed by:   Roderick Pee MD on 07/30/2009   Method used:   Electronically to        [facility] [phone]* (retail)       [address]       [place], [place]  [address]       Ph: [phone]       Fax:    RxID:   [id number] TOPROL XL 50 MG  TB24 (METOPROLOL SUCCINATE) 1/2 every morning  #50 x 4   Entered and Authorized by:   Roderick Pee MD   Signed by:   Roderick Pee MD on 07/30/2009   Method used:   Electronically to        [facility] [phone]* (retail)       [address]       [place], [place]  [address]       Ph: [phone]       Fax:    RxID:   [id number]    Immunization History:  Influenza Immunization History:    Influenza:  historical (12/25/2008)

## 2010-04-26 NOTE — Progress Notes (Signed)
Summary: Cholesterol Results  Phone Note Outgoing Call   Call placed by: Julieta Gutting, RN, BSN,  January 28, 2010 1:13 PM Call placed to: Patient Summary of Call: I spoke with the pt about the results of labwork.  He said he would have to check on the cost of Crestor before making a decision about switching medications.  I did instruct the pt to start Fish Oil 1000mg  two times a day.  The pt will call the office if he can afford to switch to Crestor and if he makes this change he will need a Lipid and Liver in 6 WEEKS.  Initial call taken by: Julieta Gutting, RN, BSN,  January 28, 2010 1:17 PM  Follow-up for Phone Call        pt returning call with info-pls call [phone] Glynda Jaeger  January 28, 2010 1:54 PM  I spoke with the pt and Crestor will cost the pt $85/month.  The pt cannot afford this medication.  At this time the pt will start using Fish Oil and continue to work on diet and exercise.  I made the pt aware that Lipitor will be going generic in the future and this may be an option.  Follow-up by: Julieta Gutting, RN, BSN,  January 28, 2010 4:00 PM    New/Updated Medications: FISH OIL 1000 MG CAPS (OMEGA-3 FATTY ACIDS) take one capsule by mouth two times a day

## 2010-04-28 NOTE — Assessment & Plan Note (Signed)
Summary: FLU SHOT // RS  Nurse Visit   Allergies: No Known Drug Allergies

## 2010-05-04 NOTE — Progress Notes (Signed)
Summary: Change Beta-Blocker  Phone Note Call from Patient Call back at Home Phone [phone]   Caller: Patient Summary of Call: Pt calling regarding medications Initial call taken by: Judie Grieve,  April 25, 2010 2:48 PM  Follow-up for Phone Call        I spoke with the pt and he was taking Toprol XL 25mg  daily.  The pt took his last dose this morning and needs this medication changed due to insurance no longer covering Toprol XL.  Will switch pt to Metoprolol Tartrate 25mg  one-half tablet two times a day.  Rx sent to pharmacy.  Follow-up by: Julieta Gutting, RN, BSN,  April 25, 2010 3:07 PM  Additional Follow-up for Phone Call Additional follow up Details #1::        Agree with this plane.   Additional Follow-up by: Ronaldo Miyamoto, MD, [facility],  April 25, 2010 6:53 PM    New/Updated Medications: METOPROLOL TARTRATE 25 MG TABS (METOPROLOL TARTRATE) Take one-half tablet by mouth twice a day Prescriptions: METOPROLOL TARTRATE 25 MG TABS (METOPROLOL TARTRATE) Take one-half tablet by mouth twice a day  #30 x 6   Entered by:   Julieta Gutting, RN, BSN   Authorized by:   Ronaldo Miyamoto, MD, [facility]   Signed by:   Julieta Gutting, RN, BSN on 04/25/2010   Method used:   Electronically to        [facility] [phone]* (retail)       [address]       [place], [place]  [address]       Ph: [phone]       Fax:    RxID:   [id number]

## 2010-05-04 NOTE — Progress Notes (Signed)
Summary: new rx  Phone Note Call from Patient Call back at Home Phone [phone]   Caller: Patient Summary of Call: patient is calling because his toprol is no longer covered by his insurance.  Metoprolol, carvediol, and atenolol are coverd.  Is this okay to switch? Initial call taken by: Kern Reap CMA Duncan Dull),  April 22, 2010 5:03 PM  Follow-up for Phone Call        Fleet Contras please call with his heart condition, I would ask Dr. Tedra Senegal to take what he feels would be best Follow-up by: Roderick Pee MD,  April 22, 2010 6:29 PM  Additional Follow-up for Phone Call Additional follow up Details #1::        left message on machine for patient  Additional Follow-up by: Kern Reap CMA Duncan Dull),  April 25, 2010 12:17 PM

## 2010-08-09 NOTE — Assessment & Plan Note (Signed)
[facility]                            CARDIOLOGY OFFICE NOTE   NAME:Hunter Young, Hunter Young                         MRN:          [id number]  DATE:02/10/2008                            DOB:          Mar 06, 1941    HISTORY OF PRESENT ILLNESS:  Mr. Fennell is in for followup.  In general,  he is doing extremely well.  He has not been having any chest pain.  He  did notice a little bit of discomfort, but he had been eating a lot of  tomatoes.  He ended up seeing Dr. Amador Cunas who placed him on a  medicine before meals and at bedtime that eventually resolved.  His last  catheterization was done by Dr. Juanda Chance.  This revealed nonobstructive  disease with prior PCI with 40% narrowing in the proximal LAD, 0% at the  stent site in the mid circumflex, and 30% stenosis of stent site in the  distal circumflex, 70-80% stenosis in the midportion of the nondominant  RCA with EF of 60%.  The patient does eat a lot of tomatoes, and I  encouraged him to reconsider all of this.   MEDICATIONS:  1. Lisinopril 40 mg daily.  2. Pantoprazole 40 mg daily.  3. Centrum Silver.  4. VESIcare 10.  5. Aspirin 325.  6. Zocor 80.  7. Niaspan 50.  8. Toprol-XL 50 one-half daily.  9. Plavix 75 mg daily.   PHYSICAL EXAMINATION:  GENERAL:  He is alert and oriented.  VITAL SIGNS:  He is overweight at 264 pounds.  Blood pressure is 130/80  and pulse is 55.  LUNG:  Lung fields are actually quite clear.  HEART:  The cardiac rhythm is regular.  I do not appreciate a  significant murmur.  EXTREMITIES:  There is no extremity edema.   EKG reveals sinus bradycardia, otherwise normal rhythm.   IMPRESSION:  1. Nonobstructive coronary artery disease with prior percutaneous      stenting.  2. Last myocardial perfusion study demonstrating normal contractility      in all areas, ejection fraction of 58%.  No significant ST-segment      changes in low-risk adenosine nuclear scan with apical thinning  but      no ischemia.  3. Hypercholesterolemia on lipid lowering therapy.  4. Hypertension.  5. Moderate obesity.   RECOMMENDATIONS:  1. I think at this point in time, it would be reasonable to      discontinue his Plavix.  2. He can decrease his aspirin to 81 mg.  3. Continue followup in Cardiology in 6 months.     [name]. Riley Kill, MD, [facility]  Electronically Signed    TDS/MedQ  DD: 02/10/2008  DT: 02/11/2008  Job #: [id number]

## 2010-08-09 NOTE — Assessment & Plan Note (Signed)
[facility]                         GASTROENTEROLOGY OFFICE NOTE   NAME:Hunter Young, Hunter Young                         MRN:          [id number]  DATE:01/11/2007                            DOB:          08/10/1940    Mr. 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  He is referred through the courtesy of Dr. Tawanna Cooler for  followup colonoscopy exam per a family history of colon cancer in his  mother in her mid 46s.  The patient's last colonoscopy exam was  unremarkable in February of 2003 except for diverticulosis and some  internal hemorrhoids.   Mr. Largo denies any current GI complaints whatsoever, and is having  regular bowel movements without melena or hematochezia.  He denies upper  GI or hepatobiliary complaints.  He has no history of hepatitis or  pancreatitis.   PAST MEDICAL HISTORY:  Remarkable for rather severe coronary artery  disease with several angioplasties and stents placed.  He was followed  by Dr. Riley Kill but has not seen him in 5 years.  He is on chronic Plavix  and aspirin therapy.  He really denies any current cardiovascular or  pulmonary complaints.  His chart mentions that he has sleep apnea, but  the patient denies that this a problem, and he certainly is not on any  CPAP machine therapy.  The patient has had previous orthopedic surgical  procedures, and also suffers from hypertension and hyperlipidemia which  is well controlled.   MEDICATIONS:  1. Plavix 75 mg a day.  2. Toprol 25 mg a day.  3. Niaspan 500 mg a day.  4. Lipitor 40 mg at bedtime.  5. Aspirin 81 mg a day.  6. Zestril 20 mg a day.  7. Nitroglycerin p.r.n.   He denies drug allergies.   FAMILY HISTORY:  Remarkable for colon cancer in his mother in her mid  88s.  Family history otherwise unremarkable.   SOCIAL HISTORY:  The patient is married, lives with his wife, has a high  school education.  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   REVIEW OF SYSTEMS:  Noncontributory.  He denies current active  cardiovascular or pulmonary complaints.  I do not have any recent  laboratory data on this patient.   PHYSICAL EXAMINATION:  He is a healthy-appearing white male in no  distress, appearing his stated age.  He is 5 feet 9 inches tall and weighs 255 pounds.  Blood pressure is  118/92, and pulse was 68 and regular.  I could not appreciate stigmata of chronic liver disease or thyromegaly.  CHEST:  Clear and he appeared to be in a regular rhythm without murmurs,  gallops, or rubs.  There is no hepatosplenomegaly, abdominal masses or tenderness.  Bowel  sounds were normal.  Peripheral extremities were unremarkable.  Mental status was normal.  Rectal exam was deferred.   ASSESSMENT:  1. Strong family history of colon cancer in a first degree relative      with need for followup  colonoscopy at this time.  2. Asymptomatic diverticulosis coli.  3. Hypertensive cardiovascular disease with hypertension.  4. Status post multiple angioplasties and cardiac stent placements.  5. Chronic anticoagulation with Plavix and aspirin.   RECOMMENDATIONS:  We will proceed with colonoscopy exam with the  standard MoviPrep.  I think the patient should probably continue his  Plavix and aspirin during this procedure because of his rather high risk  coronary artery disease problems.  He is to continue on his other  medications as listed above.     Vania Rea. Jarold Motto, MD, [facility], FAGA  Electronically Signed    DRP/MedQ  DD: 01/11/2007  DT: 01/12/2007  Job #: [id number]   cc:   Tinnie Gens A. Tawanna Cooler, MD  [name]. Riley Kill, MD, [facility]

## 2010-08-12 ENCOUNTER — Other Ambulatory Visit: Payer: Self-pay | Admitting: Family Medicine

## 2010-08-12 NOTE — Op Note (Signed)
[place]  Patient:    Hunter [name], Hunter [name]                         MRN: [id number] Adm. Date:  [id number] Disc. Date: [id number] Attending:  Ronaldo Miyamoto                           Operative Report  NO DICTATION DD:  09/22/99 TD:  09/24/99 Job: [id number]

## 2010-08-12 NOTE — Op Note (Signed)
[place]  Patient:    Hunter [name], Hunter [name]                         MRN: [id number] Proc. Date: 05/24/99 Adm. Date:  [id number] Disc. Date: [id number] Attending:  Ronaldo Miyamoto CC:         Katy Fitch. Sypher, Montez Hageman., M.D. x 2                           Operative Report  PREOPERATIVE DIAGNOSIS:  Chronic mucus cyst right long finger, dorsal radial aspect.  POSTOPERATIVE DIAGNOSIS:  Chronic mucus cyst right long finger, dorsal radial aspect.  OPERATION:  Excision of mucus cyst, debridement of DIP joint with removal of osteophyte from distal phalanx, and irrigation of DIP joint.  SURGEON:  Katy Fitch. Sypher, Montez Hageman., M.D.   ASSISTANT:  Hunter [name], P.A.  ANESTHESIA:  Marcaine 0.25% without epinephrine and 2% lidocaine without epinephrine.  Metacarpal head level block right long fingers.  Supervising anesthesiologist is Dr. Katy Fitch. Sypher, Montez Hageman.  INDICATION:  Hunter [name] is a 70 year old man who has had a large mucus cyst develop on the dorsal radial aspect of his right long finger DIP joint.  This is causing a nail groove.  He requested removal.  DESCRIPTION OF PROCEDURE:  Hunter [name] is brought to the operating room and placed in the supine position on the operating table.  Following 0.25% Marcaine and 2%  lidocaine metacarpal head level block, the right arm is prepped with Betadine soap and solution and sterilely draped.  Following exsanguination of the long finger with the gauze wrap, a 3/4 inch Penrose drain was placed in the base of the finger as a digital tourniquet.  The procedure commenced with a curvilinear incision directly over the cyst. Subcutaneous tissues were carefully divided taking care to remove the membrane rom the cyst.  The capsule between the terminal slip of the extensor mechanism and he radial quadrant ligament was resected.  The osteophyte on the deep surface of the extensor tendon was removed with a house  curet.  The joint was irrigated and debrided.  The skin was then repaired with interrupted sutures of 5-0 nylon.  A compressive dressing was applied with xeroform, sterile gauze, and Coban.  There were no apparent complications.  DISCHARGE INSTRUCTIONS:  Hunter [name] was given a prescription for Tylenol with Codeine No. 3 with 1-2 tablets p.o. q.4-6h. p.r.n. pain.  A total of 20 tablets without  refill.  He will return to our office for re-evaluation in 10 days or sooner p.r.n. problems. DD:  05/24/99 TD:  05/24/99 Job: [id number]

## 2010-08-12 NOTE — Discharge Summary (Signed)
[name], [name] NO.:  [id number]   MEDICAL RECORD NO.:  [id number]          PATIENT TYPE:  OBV   LOCATION:  6531                         FACILITY:  [facility]   PHYSICIAN:  Hunter [name], D.O. [facility]   DATE OF BIRTH:  1940-11-29   DATE OF ADMISSION:  07/11/2005  DATE OF DISCHARGE:  07/13/2005                                 DISCHARGE SUMMARY   DISCHARGE DIAGNOSES:  1.  Chest pain status post cardiac catheterization performed July 12, 2005      per Hunter [name].  2.  Rule out sleep apnea.   HISTORY OF PRESENT ILLNESS:  Patient is a 70 year old white male with a past  medical history of coronary artery disease, hypertension, hyperlipidemia,  and osteoarthritis who was admitted with chest pain.  The patient noted on  admission three episodes of angina over three days prior to admission.  He  noted that this pain was like the pain he had prior to stent placement two  years ago.  He was admitted for further evaluation.   PAST MEDICAL HISTORY:  1.  Hypertension.  2.  Hyperlipidemia.  3.  Coronary artery disease.  4.  History of bilateral knee replacement as well as foot surgery.  5.  History of prior tonsillectomy.   HOSPITAL COURSE:  #1 - CHEST PAIN:  The patient was admitted and he  underwent serial cardiac enzymes.  He also underwent cardiac catheterization  which was performed on July 12, 2005 by Hunter [name] which showed non-  obstructive coronary artery disease.  It is recommended that patient be  discharged home on his current medications and that he follow up with Dr.  Riley [name] in approximately two weeks.   #2 - RULE OUT SLEEP APNEA:  The patient was noted to have some mild hypoxia  with O2 saturations down around 90, especially at night.  A D-dimer was  checked which was elevated with a value of 0.45.  In addition, a CT angio of  the chest was performed which was negative for PE.  He is noted to have some  bradycardia at night and it was recommended by  cardiology the patient  undergo an outpatient sleep study.  Current O2 saturation at time of  discharge was 95% on room air.   DISCHARGE MEDICATIONS:  1.  Aspirin 325 mg p.o. daily.  2.  Plavix 75 mg p.o. daily.  3.  Altace 10 mg p.o. daily.  4.  Toprol XL 50 mg one-half tablet p.o. daily.  5.  Niaspan 500 mg p.o. daily.  6.  Lipitor 40 mg p.o. at bedtime.  7.  Nitroglycerin 0.4 mg as needed.  8.  Zyrtec as before.  9.  Flomax once daily as before.   PERTINENT LABORATORIES AT DISCHARGE:  D-dimer 0.45.  BUN 9, creatinine 0.9.  Hemoglobin 14.8, hematocrit 45.   DISPOSITION:  Plan to transfer patient to home.  He is to follow up with Dr.  Riley [name] on Jul 25, 2005 at 10:15 a.m.  He is also instructed to follow up  with Dr. Tawanna [name] and he will  call for an appointment.      Hunter S. Peggyann Juba, NP      Hunter [name], D.O. [facility]  Electronically Signed    MSO/MEDQ  D:  07/13/2005  T:  07/13/2005  Job:  [id number]
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
## 2010-08-12 NOTE — Cardiovascular Report (Signed)
NAME:  Hunter [name], SCHALLER NO.:  [id number]   MEDICAL RECORD NO.:  [id number]          PATIENT TYPE:  INP   LOCATION:  6531                         FACILITY:  [facility]   PHYSICIAN:  Charlies Constable, M.D. [facility] DATE OF BIRTH:  06/01/1940   DATE OF PROCEDURE:  07/12/2005  DATE OF DISCHARGE:                              CARDIAC CATHETERIZATION   CLINICAL HISTORY:  [name] is 70 years old and had a remote stent placed in  the distal circumflex artery followed by rotational atherectomy and brachy  therapy.  He developed restenosis and required another cutting balloon  angioplasty and at that time had a stent placed in the mid circumflex  artery.  This last procedure was in 2001 by Dr. Riley Kill.  He did well, but  recently has had symptoms of chest tightness and shortness of breath both at  rest and with exertion over the past several days.  He was admitted to the  hospital.  His cardiac markers were negative.   PROCEDURE:  The procedure was performed via the right femoral artery using  arterial sheath and 6-French preformed coronary catheters.  A front wall  arterial puncture was performed and Omnipaque contrast was used.  A distal  aortogram was performed to rule out abdominal aortic aneurysm.  The right  femoral artery was closed with AngioSeal at the end of the procedure.  The  patient tolerated the procedure well and left the laboratory in satisfactory  condition.   RESULTS:  Left main coronary artery:  Free of significant disease.   Left anterior descending artery:  Gave rise to four diagonal branches and  two septal perforators.  The LAD was irregular and there was 40% narrowing  in the proximal vessel.   Circumflex artery:  Gave rise to a small marginal branch, a large marginal  branch, and three posterolateral branches.  There was 0% stenosis at the  stent in the mid circumflex artery.  There was 30% stenosis at the stent in  the distal right coronary artery before the  posterolateral branches.  There  was 40% narrowing in the distal circumflex artery after the first  posterolateral branch.   Right coronary artery:  Non-dominant vessel.  Gave rise to two right  ventricular branches.  There was 70-80% narrowing in the mid vessel.   LEFT VENTRICULOGRAM:  The left ventriculogram performed in the RAO  projection showed slight hypokinesis of a very small portion of the  inferobasal segment.  The overall wall motion was good with an estimated  ejection fraction of 60%.   DISTAL AORTOGRAM:  A distal aortogram was performed which showed patent  renal arteries and no significant __________ obstruction.   CONCLUSIONS:  Non-obstructive coronary artery disease status post prior  percutaneous coronary interventions as described above with 40% narrowing in  the proximal left anterior descending, 0% stenosis at the stent site in the  mid circumflex artery and 30% stenosis at the stent site in the distal  circumflex artery, 70-80% stenosis in the mid portion of a non-dominant  right coronary artery and minimal inferobasal wall hypokinesis  with an  estimated ejection fraction of 60%.   RECOMMENDATIONS:  Patient has a non-obstructive coronary disease.  Etiology  of the symptoms not clear.  He has both dyspnea and chest tightness which  are somewhat impressive and I think we should rule out a pulmonary embolism.  We drew a D-dimer today and will plan to get a spiral CT scan with contrast  today or tomorrow.           ______________________________  Charlies Constable, M.D. [facility]     BB/MEDQ  D:  07/12/2005  T:  07/13/2005  Job:  [id number]   cc:   [name]. Riley Kill, M.D. [facility]  [address]  [address]  [place]  [place] [address]   CP Lab

## 2010-08-12 NOTE — Cardiovascular Report (Signed)
[place]  Patient:    Hunter [name], Hunter [name]                         MRN: [id number] Proc. Date: 09/22/99 Adm. Date:  [id number] Disc. Date: [id number] Attending:  Ronaldo Miyamoto CC:         Evette Georges, M.D. [facility]             Thomas D. Riley Kill, M.D. [facility]             CV Laboratory                        Cardiac Catheterization  INDICATIONS:  Hunter [name] is a pleasant 70 year old who presents with a history of recurrent chest discomfort.  He has had prior percutaneous stenting of the distal circumflex artery, which is a dominant vessel.  This was treated with stenting and then subsequently rotational atherectomy for in-stent re-stenosis. He has developed recurrent chest pain suggestive of a second in-stent re-stenosis and is brought back to the catheterization laboratory for further evaluation.  PROCEDURES:  Left heart catheterization, selective coronary angiography, selective left ventriculography, percutaneous transluminal coronary angioplasty of in-stent re-stenosis with Novoste brachytherapy, percutaneous stenting of the proximal/mid circumflex coronary artery.  DESCRIPTION OF PROCEDURE:  The patient was brought to the catheterization lab and prepped and draped in the usual fashion.  Xylocaine 1% was used for local anesthesia.  Through an anterior puncture the right femoral artery was entered using a SmartNeedle.  There was some difficulty getting through presumably secondary to some scar tissue.  The artery was entered and a 6 French sheath was placed.  Views of the left and right coronary arteries were then obtained in multiple angiographic projections.  Ventriculography was performed in the RAO projection.  Following this, it was our feeling that he would likely need repeat percutaneous intervention.  The sheath was replaced over a stiff J wire and an 8 [place] sheath was placed.  Preparations were made for percutaneous intervention.  The  guiding catheter was passed to the central aorta and heparin and Integrilin were given according to protocol.  I discussed the case with the patient and he was willing to proceed.  We were able to cannulate using a JL4 guiding catheter.  The lesion was crossed with a 0.014 Hi-Torque Floppy wire after adequate heparinization had been achieved.  Using a 10 mm 3.5 cutting balloon, this was passed to the site of in-stent re-stenosis and was successfully redilated the stent.  Inflations were performed up to 8 atmospheres.  We then removed the cutting balloon and the lesion was crossed with the Novoste delivery device.  Novoste brachytherapy was performed by Dr. Dayton Scrape.  There was also evidence of a more proximal lesion that was not covered by the radiation that appeared to have a ruptured plaque.  This was crossed with a 4 mm 15 mm length NIR stent and subsequently stented up to 14 atmospheres.  There was marked improvement in the appearance of the artery. All catheters were subsequently removed.  The ACT was appropriate for the procedure.  The patient was then taken to the holding area in satisfactory clinical condition.  HEMODYNAMIC DATA:  The central aortic pressure was 145/83. LV pressure 143/14.  There was no gradient on pullback across the aortic valve.  ANGIOGRAPHIC DATA:  The left main coronary artery is large and free of critical disease.  The left  anterior descending artery courses to the apex.  There was moderate calcification in the mid vessel.  In the mid vessel there is evidence of about a 50% to at most 60% somewhat eccentric stenosis that appears to be luminal plaquing predominately.  No critical lesions are noted.  The distal LAD wraps the apex where it bifurcates.  There are several small diagonal branches, none of which have critical disease.  The circumflex coronary artery is a dominant vessel.  There was a small ramus intermedius.  After the tiny first marginal there is  evidence of an eccentric ruptured plaque.  This narrows the lumen about 50% to 70%.  Following stenting, this was reduced to 0% with illumination of the area of plaque rupture.  There is a second marginal branch that has a diffuse 70% stenosis at the ostium and this leads to a moderately large marginal branch.  The distal circumflex has a stent prior to the bifurcation of the posterolateral and posterior descending system.  This stent demonstrates in-stent re-stenosis and there was subsequent reduction in the stenosis from 90% to about 40% followed by brachytherapy.  The distal vessel consists of a posterior descending and posterolateral branch and there is about 60% narrowing just prior to this area.  The right coronary artery is nondominant and has about an 80% mid stenosis. However, there is not significant distal distribution of this vessel.  CONCLUSIONS: 1. Successful percutaneous stenting of a ruptured plaque in the proximal    mid circumflex. 2. Successful repeat balloon dilatation of in-stent re-stenosis distally in    the circumflex just prior to the PDA, PLA takeoff with subsequent    brachytherapy by Dr. Ballard Russell. 3. Preserved overall left ventricular function. 4. Other lesions as noted above.  DISPOSITION:  If the patient were to develop recurrent ischemia we would likely recommend revascularization surgery.  However, his LAD is not critically diseased and his right is insignificant.  He will be treated with year long Plavix. DD:  09/22/99 TD:  09/24/99 Job: [id number]

## 2010-08-12 NOTE — Cardiovascular Report (Signed)
[place]. [facility]  Patient:    Hunter [name], Hunter [name]                         MRN: [id number] Proc. Date: 05/09/99 Adm. Date:  [id number] Attending:  Ronaldo Miyamoto CC:         Evette Georges, M.D. [facility]             Thomas D. Riley Kill, M.D. [facility]             Bruce R. Juanda Chance, M.D. [facility]             Cardiac Catheterization Laboratory                        Cardiac Catheterization  INDICATIONS:  Mr. Hardman is 70 years old and had a previous stenting of the circumflex artery performed by Dr. Gerri Spore in August 2000.  He developed recurrent symptoms of angina and was admitted and scheduled for catheterization.  DESCRIPTION OF PROCEDURE:  The procedure was performed via the right femoral artery using an arterial sheath and 6 French preformed coronary catheters.  A front wall arterial puncture was then performed and Omnipaque contrast was used.  After it was recognized there was tight in-stent re-stenosis in the circumflex artery, preparation made for intervention.  The patient was given weight-adjusted heparin to prolong the ACT greater than 200 seconds and was given double bolus Integrilin and infusion.  We initially crossed the lesion in the circumflex artery with a old Rotafloppy wire without difficulty.  We used a 4.0, 8 [place] guiding catheter. We then ______ the lesion with a 2.5 Ranger balloon because we were concerned we would lose flow when we passed the rotablator.  We then went in with a 1.5 bur nd performed a total of six runs at approximately 160,000 RPMs for approximately 15 seconds each.  We the exchanged for a 2.0 bur and performed a total of six runs at approximately 160,000 RPMs for approximately 15 seconds per run.  The patient developed bradycardia and hypotension during the bur runs and required Atropine  fluids and dopamine, but otherwise tolerated the rotational arthrectomy well. e then performed an IVUS on the circumflex  artery to try and determine the optimum balloon size.  The distal reference vessel was a 4.0 vessel with a 2.6 x 3.0 lumen.  The stent was well expanded through its course with ______ dimensions of about  3.0 x 2.9, and the proximal reference lumen was 3.8 x 3.3 with a vessel that was 4.8.  Based on these measurements we decided to use a 3.25 Quantum Ranger in the distal segment of the stent and performed three inflations up to 15 atmospheres for 50 seconds.  We then used a 3.5, 9 mm Quantum Ranger for the proximal portion of the stent and performed two inflations of 15 and 16 atmospheres for 38 and 34 seconds.  Repeat diagnostic studies were then performed through the guiding catheter.  The patient tolerated the procedure well and left the laboratory in satisfactory condition.  RESULTS:  The aortic pressure was 134/75 with a mean of 96.  Left ventricular pressure was 134/15.  The left main coronary artery:  The left main coronary artery was free of significant disease.  Left anterior descending:  The left anterior descending artery gave rise to two  diagonal branches and two septal perforators.  These and the LAD proper were free  of significant disease.  Circumflex artery:  The circumflex artery was a dominant vessel and gave rise to a small intermediate branch, a small and large marginal branch, an atrial branch, two large posterolateral branches and posterior descending branch.  There was 95% in-stent re-stenosis in the AV circumflex artery located before the two posterolateral and posterior descending branches.  There was also 60% narrowing  after the first posterolateral branch.  Right coronary artery:  The right coronary was a small nondominant vessel that ave rise to a conus and right ventricular branches.  There was an 80% stenosis in the right ventricular branch.  LEFT VENTRICULOGRAPHY:  The left ventriculogram performed in the RAO projection  showed good  wall motion with no areas of hypokinesis.  The estimated ejection fraction was 60%.  Following rotational atherectomy and PTCA for the in-stent re-stenosis the stenosis improved from 95% to 20%.  CONCLUSIONS: 1. Coronary artery disease, status post prior stenting of the AV circumflex artery,    August 2000 with 95% in-stent re-stenosis, 80% stenosis in a small nondominant    right coronary artery and no significant disease in the LAD and normal LV    function. 2. Successful rotational atherectomy and PTCA for the in-stent stenosis with  improvement in percent diameter narrowing from 95% to 20%.  DISPOSITION:  The patient was returned to the postangioplasty for further observation.  His recurrence rate will be moderately high probably in the 40% range.  Hopefully we will have some other options available if this should happen. DD:  05/09/99 TD:  05/09/99 Job: [id number]

## 2010-08-12 NOTE — Telephone Encounter (Signed)
Time for an office visit 

## 2010-08-12 NOTE — Discharge Summary (Signed)
[place]. [facility]  Patient:    Hunter [name], Hunter [name]                         MRN: [id number] Adm. Date:  [id number] Disc. Date: 09/23/99 Attending:  Ronaldo Miyamoto Dictator:   Tereso Newcomer, P.A. CC:         Evette Georges, M.D. [facility]                  Referring Physician Discharge [place]  DATE OF BIRTH:  06-Jul-2040  DISCHARGE DIAGNOSES: 1. Coronary artery disease. 2. Hyperlipidemia. 3. History of stenting of circumflex coronary artery with intervention in    March of 2001 due to end-stent restenosis.  Intervention included    high-speed rotatory artherectomy and percutaneous transluminal coronary    angioplasty.  PROCEDURES: 1. Coronary angiogram on September 22, 1999. 2. Status post stent to proximal mid circumflex due to ruptured plaque with    reduction of stenosis from 70% to 0% and status post cutting blade and    brachytherapy to distal circumflex with reduction of stenosis from 90% to    40%.  This was done secondary to end-stent restenosis. 3. In addition to that already noted, catheterization revealed an 80% right    coronary artery lesion, ejection fraction 50-55%, and mild global    hypokinesis.  HISTORY OF PRESENT ILLNESS:  Hunter [name]. Hunter [name], M.D., originally saw this 70 year old male in the office in follow-up on Aug 11, 1999.  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  These were the same as his previous pain described as tightness, pressure, and occasional radiation to his left shoulder and arm.  The pain lasted for greater than an hour.  The patient did not take nitroglycerin.  However, in the past when he did take nitroglycerin, it did relieve the pain.  He reported some dyspnea on exertion and fatigue, but no PND, orthopnea, edema, or claudication symptoms. Dr. Riley [name] felt that the patient should have cardiac catheterization at  the time of the original visit.  The patient was to think about it and then let Dr. Riley [name] know what his decision was.  The patient eventually decided to go ahead with cardiac catheterization and was admitted to [facility] on September 14, 1999.  PHYSICAL EXAMINATION:  A well-developed male in no acute distress.  NECK:  Without JVD or bruits.  CHEST:  Clear to auscultation bilaterally.  CARDIOVASCULAR:  Regular rate and rhythm.  No murmurs.  EXTREMITIES:  Without edema.  LABORATORY DATA:  EKG with sinus bradycardia and no acute changes.  Hemoglobin 15.7, hematocrit 47.2, white count 8600, platelet count 316,000. INR 0.9.  Sodium 140, potassium 4.6, chloride 107, CO2 27, BUN 17, creatinine 1.0, glucose 99.  HOSPITAL COURSE:  The patient was admitted on September 22, 1999.  He underwent cardiac catheterization.  The results were noted above.  He tolerated the procedure well.  On the morning of September 23, 1999, he was found to be in stable condition and had no complaints.  His groin was stable without hematoma or bruit, but was oozing.  A bulky dressing was applied.  It was felt that this patient was stable enough for discharge to home.  DISCHARGE MEDICATIONS: 1. Plavix 75 mg q.d. for one year. 2. Toprol XL 50 mg half of a tablet q.d.  3. Lipitor 20 mg p.o. q.h.s. 4. Coated aspirin 325 mg q.d. 5. Glucose as directed.  ACTIVITY:  No driving, heavy lifting, or exertional or sexual activity for three days.  DIET:  Low-fat, low-cholesterol, low-sodium diet.  WOUND CARE:  Watch groin for any increased swelling, bleeding, or bruising. Call our office for concerns.  FOLLOW-UP:  The patient is to follow up with Hunter [name]. Hunter [name], M.D., on September 29, 1999, at 3:45 p.m. DD:  09/23/99 TD:  09/23/99 Job: [id number]

## 2010-08-12 NOTE — Cardiovascular Report (Signed)
[place]. [facility]  Patient:    Hunter [name], Hunter [name]                         MRN: [id number] Proc. Date: 07/18/00 Adm. Date:  [id number] Attending:  Ronaldo Miyamoto CC:         Evette Georges, M.D. [facility]  Cardiac Catheterization Lab   Cardiac Catheterization  INDICATIONS:  Obdulio is a very delightful individual who has had previous percutaneous stenting of the distal dominant circumflex.  This was followed by percutaneous rotational atherectomy for in-stent restenosis.  He then developed repeat in-stent restenosis and was treated with a cutting balloon and radiation as well as stenting of a more proximal vessel because of more proximal dissection and stenosis.  At that time, he also has an ostial marginal lesion, but we elected to treat that medically.  The patient has done well but has developed some shortness of breath.  Repeat stress Cardiolite revealed the patient was able to walk for 10 minutes without chest pain or ST depression, but there was some lateral ischemia, and because of this, he was brought back to the catheterization lab for further evaluation.  He has also had some shortness of breath.  PROCEDURES: 1. Left heart catheterization. 2. Selective coronary arteriography. 3. Selective left ventriculography.  DESCRIPTION OF PROCEDURE:  The procedure was performed from the right femoral artery using 6-French catheters.  He tolerated the procedure well.  At the end of the procedure, we elected to use a 6-French JL4 guiding catheter to better opacify the left coronary artery.  We were able to complete the procedure without complication.  I brought the patients wife into the holding area to review the films in detail.  I also reviewed them with the patient, and he was taken to the holding area in satisfactory clinical condition.  HEMODYNAMIC DATA:  The central aortic pressure was 135/74.  LV pressure was 139/18.  There was no gradient on  pullback across the aortic valve.  ANGIOGRAPHIC DATA:  Ventriculography in the RAO projection reveals preserved global systolic function.  Ejection fraction was calculated at 69%.  No segmental abnormalities or contraction were identified.  1. The left main coronary artery was free of critical disease. 2. The left anterior descending artery coursed to the apex.  There was some    mid calcification with about 40% mid narrowing to, at most 50%, and this    seems to be less than before.  There were several small diagonal branches    with some luminal irregularities at their ostium and small caliber in    nature, but without known critical disease. 3. The circumflex proper provides a tiny first marginal branch which is    insignificant.  There was about 20-30% narrowing followed by the site of    stenting.  The stented site looks widely patent without significant    narrowing.  Just distal to this is the origin of a very large marginal    branch which bifurcates and then bifurcates secondarily in a distal manner.    This has an ostial stenosis of about 80%.  This occurs right at the ostium.    It is not ideal for percutaneous intervention because it comes off at an    angle from the main vessel and is ostial in location, making stenting    difficult.  Distal to this site is another stent which has been previously    placed.  This secondary stent demonstrates about 50% narrowing but is not    tightly restenosed and looks quite smooth considering the radiation    therapy.  There is about 40% narrowing distally at the distal tip.  In    addition, there is about 50% narrowing in the distal AV circumflex leading    into the posterior descending and posterolateral branches.  This does not    appear to be critical but appears to be segmentally and diffusely diseased. 4. The right coronary artery is a nondominant vessel with diffuse 60%    narrowing throughout its mid portion.  CONCLUSIONS: 1.  Preserved overall left ventricular function. 2. No evidence of in-stent restenosis at the proximal stent or actually in the    distal stent except as described above. 3. Some distal narrowing leading into a very small posterior descending    branch. 4. Moderately high grade ostial stenosis of the circumflex marginal which had    been previously present.  DISPOSITION:  The two stent sites look actually quite good.  There is some distal disease as well as disease of the obtuse marginal.  The obtuse marginal could be treated with a cutting balloon, but it is ostial in location and certainly not ideal.  The patient has not been having significant chest pain and primarily has been having some shortness of breath.  He is overweight and somewhat limited in his activity.  A continued medical program would likely be recommended. DD:  07/18/00 TD:  07/18/00 Job: [id number]

## 2010-08-12 NOTE — Consult Note (Signed)
Hunter [name], Hunter [name] NO.:  [id number]   MEDICAL RECORD NO.:  [id number]          PATIENT TYPE:  INP   LOCATION:  6527                         FACILITY:  [facility]   PHYSICIAN:  Hunter [name], M.D. LHCDATE OF BIRTH:  February 19, 1941   DATE OF CONSULTATION:  07/11/2005  DATE OF DISCHARGE:  07/11/2005                                   CONSULTATION   CONSULTING PHYSICIAN:  Hunter [name], M.D. [facility].   The patient is a 70 year old male with a past medical history of coronary  artery disease, hypertension, hyperlipidemia, osteoarthritis who is admitted  with chest pain.  We are asked to further evaluate.  The patient does have a  cardiac history.  He has had prior PCI of his circumflex multiple times.  Over the past three to four days he has noticed recurrent chest pain.  It is  substernal in location and does not radiate.  It is described as a  pressure.  The pain is not pleuritic or positional, nor is it related to  food.  There is associated shortness of breath but there is no nausea,  vomiting, or diaphoresis.  The pain is identical to his symptoms prior to  his PCI.  He had an episode Friday that was short lived.  It was  approximately one hour on Saturday and one hour on Sunday.  He had symptoms  at rest yesterday and today.  Note, his symptoms do improve with  nitroglycerin.  He was seen by Dr. Tawanna [name] and admitted and we were asked to  further evaluate.   He has no known drug allergies.   PRESENT MEDICATIONS:  1.  Altace 10 mg p.o. every day.  2.  Lipitor 40 mg p.o. every day.  3.  Niaspan 500 mg p.o. every day.  4.  Toprol 25 mg p.o. every day.  5.  Plavix 75 mg p.o. every day.  6.  Aspirin.   PAST MEDICAL HISTORY:  1.  Hypertension.  2.  Hyperlipidemia.  There is no diabetes mellitus.  1.  He has a history of coronary disease, outlined in the HPI.  2.  He has had bilateral knee replacement as well as foot surgery.  3.  He has had prior tonsillectomy.   SOCIAL HISTORY:  He has a remote history of tobacco use but none in the past  23 years.  He drinks 2-3 alcoholic beverages per day.   FAMILY HISTORY:  Positive for coronary artery disease.   REVIEW OF SYSTEMS:  He denies any headaches, fever or chills.  There is no  productive cough or hemoptysis.  There is no dysphagia, odynophagia, melena,  hematochezia.  There is no dysuria or hematuria.  There is no rash or  seizure activity.  There is orthopnea, PND, or pedal edema.  He does have  dyspnea on exertion.  The remaining systems are negative.   PHYSICAL EXAMINATION:  VITAL SIGNS:  Show a pulse of 60.  His blood pressure  was 130/84.  GENERAL:  He is well developed and obese.  He is in no acute distress.  SKIN:  Warm and dry.  There is no peripheral clubbing.  MENTAL STATUS:  He does not appear to be depressed.  HEENT:  Unremarkable with no mileage.  NECK:  Supple.  __________  bilaterally.  No bruits noted.  There is no  jugular venous distention.  I can not appreciate thyromegaly.  CHEST:  Clear to auscultation, normal expansion.  CARDIOVASCULAR:  Reveals a regular rate and rhythm.  Normal S1 and S2.  There are no murmur, rub, or gallops noted.  ABDOMEN:  Not tender, distended.  Positive bowel sounds.  No  hepatosplenomegaly.  No masses appreciated.  There is no abdominal bruit.  He has 2+ femoral pulses bilaterally.  No bruits.  EXTREMITIES:  Show no edema.  I can palpate no cords.  He has 2+ posterior  tibial pulses bilaterally.  NEUROLOGIC:  Grossly intact.   His electrocardiogram shows a normal sinus rhythm with occasional PVCs.  There are no ST changes noted.  Note, the lead is off on V3.  The remaining  laboratories are pending at the time of this dictation.   DIAGNOSES:  1.  Unstable angina.  2.  History of coronary disease, status post percutaneous coronary      intervention of the left circumflex.  3.  Hypertension.  4.  Hyperlipidemia.   PLAN:  Hunter [name] presents  with symptoms that are very concerning for angina.  They are identical to his previous symptoms prior to his interventions.  We  will plan to admit and continue with his aspirin, Plavix, Toprol, and  Statin.  I will add intravenous heparin to his medical regimen.  We will  plan to repeat his cardiac catheterization.  The risks and benefits have  been discussed and the patient agrees to proceed.  We will also check a  fasting lipid profile.  We will make further recommendations once we know  the patient's anatomy.           ______________________________  Hunter [name], M.D. [facility]     BC/MEDQ  D:  07/11/2005  T:  07/11/2005  Job:  [id number]

## 2010-08-24 ENCOUNTER — Other Ambulatory Visit: Payer: Self-pay | Admitting: Family Medicine

## 2010-08-24 NOTE — Telephone Encounter (Signed)
Refill oxybutynin 5 mg to [facility] in [place]----ph--[phone]

## 2010-08-26 NOTE — Telephone Encounter (Signed)
Is this okay to fill? 

## 2010-08-26 NOTE — Telephone Encounter (Signed)
Ok to rf? 

## 2010-10-05 ENCOUNTER — Other Ambulatory Visit: Payer: Self-pay | Admitting: Family Medicine

## 2010-10-06 ENCOUNTER — Other Ambulatory Visit: Payer: Self-pay | Admitting: Family Medicine

## 2010-11-13 ENCOUNTER — Other Ambulatory Visit: Payer: Self-pay | Admitting: Family Medicine

## 2010-11-30 ENCOUNTER — Other Ambulatory Visit: Payer: Self-pay | Admitting: Cardiology

## 2011-01-11 ENCOUNTER — Other Ambulatory Visit (INDEPENDENT_AMBULATORY_CARE_PROVIDER_SITE_OTHER): Payer: Medicare Other

## 2011-01-11 DIAGNOSIS — I10 Essential (primary) hypertension: Secondary | ICD-10-CM

## 2011-01-11 DIAGNOSIS — Z125 Encounter for screening for malignant neoplasm of prostate: Secondary | ICD-10-CM

## 2011-01-11 DIAGNOSIS — E785 Hyperlipidemia, unspecified: Secondary | ICD-10-CM

## 2011-01-11 DIAGNOSIS — Z0000 Encounter for general adult medical examination without abnormal findings: Secondary | ICD-10-CM

## 2011-01-11 LAB — POCT URINALYSIS DIPSTICK
Leukocytes, UA: NEGATIVE
Nitrite, UA: NEGATIVE
Protein, UA: NEGATIVE
Urobilinogen, UA: 0.2
pH, UA: 6

## 2011-01-11 LAB — CBC WITH DIFFERENTIAL/PLATELET
Basophils Absolute: 0 10*3/uL (ref 0.0–0.1)
Hemoglobin: 13.9 g/dL (ref 13.0–17.0)
Lymphocytes Relative: 29.6 % (ref 12.0–46.0)
Monocytes Relative: 11 % (ref 3.0–12.0)
Neutro Abs: 4 10*3/uL (ref 1.4–7.7)
Neutrophils Relative %: 55.6 % (ref 43.0–77.0)
RBC: 4.8 Mil/uL (ref 4.22–5.81)
RDW: 14.7 % — ABNORMAL HIGH (ref 11.5–14.6)

## 2011-01-11 LAB — HEPATIC FUNCTION PANEL
AST: 22 U/L (ref 0–37)
Albumin: 3.9 g/dL (ref 3.5–5.2)
Alkaline Phosphatase: 56 U/L (ref 39–117)
Bilirubin, Direct: 0.1 mg/dL (ref 0.0–0.3)

## 2011-01-11 LAB — BASIC METABOLIC PANEL
Calcium: 9.6 mg/dL (ref 8.4–10.5)
GFR: 89.18 mL/min (ref >60.00)
Glucose, Bld: 105 mg/dL — ABNORMAL HIGH (ref 70–99)
Sodium: 139 mEq/L (ref 135–145)

## 2011-01-11 LAB — LIPID PANEL
LDL Cholesterol: 95 mg/dL (ref 0–99)
Total CHOL/HDL Ratio: 3
VLDL: 14 mg/dL (ref 0.0–40.0)

## 2011-01-11 LAB — TSH: TSH: 0.93 u[IU]/mL (ref 0.35–5.50)

## 2011-01-11 LAB — PSA: PSA: 0.35 ng/mL (ref 0.10–4.00)

## 2011-01-13 ENCOUNTER — Other Ambulatory Visit: Payer: Self-pay | Admitting: Family Medicine

## 2011-01-18 ENCOUNTER — Encounter: Payer: Self-pay | Admitting: Family Medicine

## 2011-01-19 ENCOUNTER — Ambulatory Visit (INDEPENDENT_AMBULATORY_CARE_PROVIDER_SITE_OTHER): Payer: Medicare Other | Admitting: Family Medicine

## 2011-01-19 ENCOUNTER — Encounter: Payer: Self-pay | Admitting: Family Medicine

## 2011-01-19 DIAGNOSIS — E785 Hyperlipidemia, unspecified: Secondary | ICD-10-CM

## 2011-01-19 DIAGNOSIS — K219 Gastro-esophageal reflux disease without esophagitis: Secondary | ICD-10-CM

## 2011-01-19 DIAGNOSIS — N401 Enlarged prostate with lower urinary tract symptoms: Secondary | ICD-10-CM

## 2011-01-19 DIAGNOSIS — I10 Essential (primary) hypertension: Secondary | ICD-10-CM

## 2011-01-19 DIAGNOSIS — E669 Obesity, unspecified: Secondary | ICD-10-CM

## 2011-01-19 DIAGNOSIS — I2510 Atherosclerotic heart disease of native coronary artery without angina pectoris: Secondary | ICD-10-CM

## 2011-01-19 DIAGNOSIS — Z23 Encounter for immunization: Secondary | ICD-10-CM

## 2011-01-19 DIAGNOSIS — I872 Venous insufficiency (chronic) (peripheral): Secondary | ICD-10-CM

## 2011-01-19 MED ORDER — NITROGLYCERIN 0.4 MG SL SUBL: 0.4000 mg | SUBLINGUAL_TABLET | SUBLINGUAL | Status: DC | PRN

## 2011-01-19 MED ORDER — OMEPRAZOLE 20 MG PO CPDR: 20.0000 mg | DELAYED_RELEASE_CAPSULE | Freq: Every day | ORAL | Status: DC

## 2011-01-19 MED ORDER — LISINOPRIL 40 MG PO TABS: 40.0000 mg | ORAL_TABLET | Freq: Every day | ORAL | Status: DC

## 2011-01-19 MED ORDER — METOPROLOL TARTRATE 25 MG PO TABS: 25.0000 mg | ORAL_TABLET | ORAL | Status: DC

## 2011-01-19 MED ORDER — SIMVASTATIN 40 MG PO TABS: 40.0000 mg | ORAL_TABLET | Freq: Every evening | ORAL | Status: DC

## 2011-01-19 MED ORDER — OXYBUTYNIN CHLORIDE 5 MG PO TABS: 5.0000 mg | ORAL_TABLET | ORAL | Status: DC

## 2011-01-19 NOTE — Progress Notes (Signed)
Subjective:    Patient ID: Hunter Young, male    DOB: 04-03-1940, 70 y.o.   MRN: [id number]  HPI Hunter Young is a 70 year old male, nonsmoker, who comes in today for a Medicare wellness examination because of a history of hypertension, coronary disease, reflux, esophagitis, urinary frequency, and BPH, hyperlipidemia, obesity, osteoarthritis.  His medications were reviewed in detail, and there have been no changes.  He was retired............. back to work part-time.  He has difficulty getting around because of his arthritis in his hips, knees, and ankles, and he is had bilateral total knee replacements for osteoarthritis.  His vision is normal and he gets a new eye exams, hearing normal, greater dental care, colonoscopy, normal.  GI, tetanus, 2010, Pneumovax 2006, seasonal flu shot today, information given on shingles.  Cognitive function, normal activities of daily living.  Normal, except it is not exercise on a regular basis.  He was admonished to walk daily.  Home health safety reviewed.  No issues identified.  No guns in the house healthcare power of attorney and living well done.   Review of Systems  Musculoskeletal: Positive for myalgias, arthralgias and gait problem.       Objective:   Physical Exam  Constitutional: He is oriented to person, place, and time. He appears well-developed and well-nourished.       Obese white 2 54, height 67.5 inches  HENT:  Head: Normocephalic and atraumatic.  Right Ear: External ear normal.  Left Ear: External ear normal.  Nose: Nose normal.  Mouth/Throat: Oropharynx is clear and moist.  Eyes: Conjunctivae and EOM are normal. Pupils are equal, round, and reactive to light.  Neck: Normal range of motion. Neck supple. No JVD present. No tracheal deviation present. No thyromegaly present.  Cardiovascular: Normal rate, regular rhythm and intact distal pulses.  Exam reveals no gallop and no friction rub.   No murmur heard.      Heart sounds distant  because of underlying COPD no murmurs.  Carotids no bruits  Pulmonary/Chest: Effort normal. No stridor. No respiratory distress. He has no wheezes. He has no rales. He exhibits no tenderness.       Barrel-shaped chest and distant breath sounds because of underlying COPD  Abdominal: Soft. Bowel sounds are normal. He exhibits no distension and no mass. There is no tenderness. There is no rebound and no guarding.  Genitourinary: Rectum normal and penis normal. Guaiac negative stool. No penile tenderness.       2+ symmetrical.  BPH  Musculoskeletal: Normal range of motion. He exhibits no edema and no tenderness.  Lymphadenopathy:    He has no cervical adenopathy.  Neurological: He is alert and oriented to person, place, and time. He has normal reflexes. No cranial nerve deficit. He exhibits normal muscle tone.  Skin: Skin is warm and dry. No rash noted. No erythema. No pallor.  Psychiatric: He has a normal mood and affect. His behavior is normal. Judgment and thought content normal.          Assessment & Plan:  Healthy male.  Hypertension.  Continue current medications  Coronary disease, asymptomatic.  Follow up with Dr. Riley Young next week.  History of reflux esophagitis.  Continue Prilosec 20 mg daily.  History of urinary frequency, and BPH, continued to trip and 5 mg daily.  Hyperlipidemia.  Continue Zocor 40 mg daily and an aspirin tablet.  Severe osteoarthritis and degenerative joint disease.  We discussed the only thing he could do at this juncture would be to  walk on a daily basis and to lose weight.

## 2011-01-19 NOTE — Patient Instructions (Signed)
Continue your current medications.  Try a 30 minute daily walk along with a weight loss, reducing diet.   Return in one year, sooner if any problems

## 2011-01-19 NOTE — Progress Notes (Signed)
Addended by: Kern Reap B on: 01/19/2011 03:49 PM   Modules accepted: Orders

## 2011-01-23 ENCOUNTER — Telehealth: Payer: Self-pay | Admitting: Family Medicine

## 2011-01-23 NOTE — Telephone Encounter (Signed)
Pt stated that he stated that he is on Oxybutin bid , not 1qd. He would like the correction changed in the system. Thanks.

## 2011-02-01 ENCOUNTER — Encounter: Payer: Self-pay | Admitting: Cardiology

## 2011-02-01 ENCOUNTER — Ambulatory Visit (INDEPENDENT_AMBULATORY_CARE_PROVIDER_SITE_OTHER): Payer: Medicare Other | Admitting: Cardiology

## 2011-02-01 DIAGNOSIS — R0989 Other specified symptoms and signs involving the circulatory and respiratory systems: Secondary | ICD-10-CM

## 2011-02-01 DIAGNOSIS — R0609 Other forms of dyspnea: Secondary | ICD-10-CM

## 2011-02-01 DIAGNOSIS — I2510 Atherosclerotic heart disease of native coronary artery without angina pectoris: Secondary | ICD-10-CM

## 2011-02-01 DIAGNOSIS — E785 Hyperlipidemia, unspecified: Secondary | ICD-10-CM

## 2011-02-01 MED ORDER — ATORVASTATIN CALCIUM 40 MG PO TABS: 40.0000 mg | ORAL_TABLET | Freq: Every day | ORAL | Status: DC

## 2011-02-01 NOTE — Assessment & Plan Note (Signed)
Remains stable at the present time.  No angina.  Encouraged him to walk.

## 2011-02-01 NOTE — Assessment & Plan Note (Signed)
Suspect that he may have OSA.  We discussed the importance of weight loss.  I asked him to commit to twenty pounds, and he said he would consider.

## 2011-02-01 NOTE — Patient Instructions (Signed)
Your physician recommends that you schedule a follow-up appointment in: 1 year  Your physician has recommended you make the following change in your medication: stop simvastatin start atorvastatin 40 mg once daily     Your physician recommends that you return for a FASTING lipid profile: and liver profile in 6 weeks nothing to eat or drink after midnight

## 2011-02-01 NOTE — Assessment & Plan Note (Signed)
Last LDL was 95, and not at target.  Has taken lipitor in the past so will switch, and check lipid and liver in six weeks.

## 2011-02-01 NOTE — Progress Notes (Signed)
HPI:  Doing very well.  Saw Dr. Tawanna Cooler, and had physical.  Has gained some weight recently.  Denies any chest pain.  Feels good.  Does snore at night.  Working at [employer] now.  With strenuous work will get a little shortness of breath.   Current Outpatient Prescriptions  Medication Sig Dispense Refill  . aspirin 81 MG tablet Take 81 mg by mouth daily.        Marland Kitchen lisinopril (PRINIVIL,ZESTRIL) 40 MG tablet Take 1 tablet (40 mg total) by mouth daily.  100 tablet  3  . metoprolol tartrate (LOPRESSOR) 25 MG tablet Take 1 tablet (25 mg total) by mouth 1 day or 1 dose.  100 tablet  3  . Multiple Vitamin (MULTIVITAMIN) tablet Take 1 tablet by mouth daily.        . nitroGLYCERIN (NITROSTAT) 0.4 MG SL tablet Place 1 tablet (0.4 mg total) under the tongue every 5 (five) minutes as needed.  25 tablet  1  . Omega-3 Fatty Acids (FISH OIL) 1000 MG CAPS Take by mouth daily.        Marland Kitchen omeprazole (PRILOSEC) 20 MG capsule Take 20 mg by mouth daily.       Marland Kitchen oxybutynin (DITROPAN) 5 MG tablet Take 5 mg by mouth 2 (two) times daily.        . simvastatin (ZOCOR) 40 MG tablet Take 1 tablet (40 mg total) by mouth every evening.  100 tablet  3  . DISCONTD: omeprazole (PRILOSEC) 20 MG capsule Take 1 capsule (20 mg total) by mouth daily.  100 capsule  3    No Known Allergies  Past Medical History  Diagnosis Date  . ALLERGIC RHINITIS 04/22/2007  . ASTHMA, INTRINSIC NOS 12/27/2006  . BENIGN PROSTATIC HYPERTROPHY, WITH URINARY OBSTRUCTION 06/25/2007  . Cardiomegaly 05/30/2007  . CERVICAL DISORDER, NOS 10/14/2009  . CORONARY ARTERY DISEASE 12/03/2006  . DIVERTICULOSIS OF COLON 05/20/2001  . Dyshidrosis 06/25/2007  . GERD 12/06/2007  . HYPERLIPIDEMIA 12/03/2006  . HYPERTENSION 12/03/2006  . INTERNAL HEMORRHOIDS 05/20/2001  . OBESITY, MODERATE 12/05/2008  . OSTEOARTHRITIS 06/25/2007  . SNORING 11/06/2007  . VENOUS INSUFFICIENCY 12/04/2006    Past Surgical History  Procedure Date  . Foot surgery   . Tonsillectomy   . Joint  replacement     bilat knee    No family history on file.  History   Social History  . Marital Status: Married    Spouse Name: N/A    Number of Children: N/A  . Years of Education: N/A   Occupational History  . Not on file.   Social History Main Topics  . Smoking status: Former Games developer  . Smokeless tobacco: Not on file  . Alcohol Use:   . Drug Use:   . Sexually Active:    Other Topics Concern  . Not on file   Social History Narrative  . No narrative on file    ROS: Please see the HPI.  All other systems reviewed and negative.  PHYSICAL EXAM:  BP 145/78  Pulse 50  Ht 5\' 8"  (1.727 m)  Wt 251 lb 1.9 oz (113.907 kg)  BMI 38.18 kg/m2  General: Well developed, well nourished, in no acute distress. Head:  Normocephalic and atraumatic. Neck: no JVD Lungs: Clear to auscultation and percussion. Heart: Normal S1 and S2.  No murmur, rubs or gallops.  Abdomen:  Normal bowel sounds; soft; non tender; no organomegaly Pulses: Pulses normal in all 4 extremities. Extremities: No clubbing or cyanosis. No  edema. Neurologic: Alert and oriented x 3.  EKG:  SB.  Otherwise normal.   ASSESSMENT AND PLAN:

## 2011-03-15 ENCOUNTER — Other Ambulatory Visit (INDEPENDENT_AMBULATORY_CARE_PROVIDER_SITE_OTHER): Payer: Medicare Other | Admitting: *Deleted

## 2011-03-15 DIAGNOSIS — E785 Hyperlipidemia, unspecified: Secondary | ICD-10-CM

## 2011-03-15 DIAGNOSIS — R0989 Other specified symptoms and signs involving the circulatory and respiratory systems: Secondary | ICD-10-CM

## 2011-03-15 DIAGNOSIS — R0609 Other forms of dyspnea: Secondary | ICD-10-CM

## 2011-03-15 DIAGNOSIS — I2510 Atherosclerotic heart disease of native coronary artery without angina pectoris: Secondary | ICD-10-CM

## 2011-03-15 LAB — LIPID PANEL
Cholesterol: 159 mg/dL (ref 0–200)
HDL: 55.3 mg/dL (ref >39.00)
LDL Cholesterol: 91 mg/dL (ref 0–99)
Total CHOL/HDL Ratio: 3
Triglycerides: 62 mg/dL (ref 0.0–149.0)
VLDL: 12.4 mg/dL (ref 0.0–40.0)

## 2011-03-15 LAB — HEPATIC FUNCTION PANEL
Albumin: 3.9 g/dL (ref 3.5–5.2)
Total Protein: 7 g/dL (ref 6.0–8.3)

## 2011-04-03 ENCOUNTER — Telehealth: Payer: Self-pay | Admitting: Cardiology

## 2011-04-03 NOTE — Telephone Encounter (Signed)
Left message to call back  

## 2011-04-03 NOTE — Telephone Encounter (Signed)
Pt calling re lab results from 12-19. pls call

## 2011-04-04 NOTE — Telephone Encounter (Signed)
Left lab results on pt's home number.

## 2011-04-04 NOTE — Telephone Encounter (Signed)
Fu call Patient returning your call. Pt is at work and you can leave message on home phone

## 2011-04-11 ENCOUNTER — Other Ambulatory Visit: Payer: Self-pay | Admitting: *Deleted

## 2011-04-11 DIAGNOSIS — E7800 Pure hypercholesterolemia, unspecified: Secondary | ICD-10-CM

## 2011-04-26 ENCOUNTER — Ambulatory Visit (INDEPENDENT_AMBULATORY_CARE_PROVIDER_SITE_OTHER): Payer: Medicare Other | Admitting: Family Medicine

## 2011-04-26 ENCOUNTER — Encounter: Payer: Self-pay | Admitting: Family Medicine

## 2011-04-26 VITALS — BP 110/70 | Temp 97.6°F | Wt 244.0 lb

## 2011-04-26 DIAGNOSIS — J309 Allergic rhinitis, unspecified: Secondary | ICD-10-CM

## 2011-04-26 MED ORDER — PREDNISONE 20 MG PO TABS: ORAL_TABLET | ORAL | Status: DC

## 2011-04-26 NOTE — Patient Instructions (Signed)
Take the prednisone as directed return when necessary 

## 2011-04-26 NOTE — Progress Notes (Signed)
  Subjective:    Patient ID: Hunter Young, male    DOB: Jan 02, 1941, 70 y.o.   MRN: [id number]  HPI Hunter Young is a 71 year old male nonsmoker who comes in today with a 4 week history of head congestion postnasal drip. No fever no cough. He's had a history of allergic rhinitis in the past and takes over-the-counter medications and uses a steroid nasal spray however this has not helped. He also had some old amoxicillin left over which he took this did also did not help.    Review of Systems General and ENT review of systems otherwise negative    Objective:   Physical Exam  Well-developed well-nourished male in no acute distress examination of the HEENT was pertinent he has a mild septal deviation to left and 3+ nasal edema and a lot of postnasal drip consistent with allergic rhinitis. Neck was supple no adenopathy lungs are clear      Assessment & Plan:  Allergic rhinitis plan tetanus on burst and taper return when necessary

## 2011-06-12 ENCOUNTER — Telehealth: Payer: Self-pay | Admitting: Cardiology

## 2011-06-12 DIAGNOSIS — I10 Essential (primary) hypertension: Secondary | ICD-10-CM

## 2011-06-12 MED ORDER — METOPROLOL TARTRATE 25 MG PO TABS: ORAL_TABLET | ORAL | Status: DC

## 2011-06-12 NOTE — Telephone Encounter (Signed)
I spoke with the pt and he is taking Metoprolol Tartrate 25mg  take one-half tablet by mouth twice a day.  The pt would like a 90 day supply. Rx sent to pharmacy.

## 2011-06-12 NOTE — Telephone Encounter (Signed)
Please return call to patient at [phone]   Patient returning nurse call, he can be reached at [phone]

## 2011-06-20 ENCOUNTER — Other Ambulatory Visit (INDEPENDENT_AMBULATORY_CARE_PROVIDER_SITE_OTHER): Payer: Medicare Other

## 2011-06-20 DIAGNOSIS — E7800 Pure hypercholesterolemia, unspecified: Secondary | ICD-10-CM

## 2011-06-20 LAB — LIPID PANEL
Cholesterol: 177 mg/dL (ref 0–200)
HDL: 57.8 mg/dL (ref >39.00)
Triglycerides: 183 mg/dL — ABNORMAL HIGH (ref 0.0–149.0)

## 2011-06-21 ENCOUNTER — Other Ambulatory Visit: Payer: Medicare Other

## 2011-06-28 ENCOUNTER — Telehealth: Payer: Self-pay | Admitting: Cardiology

## 2011-06-28 DIAGNOSIS — E7800 Pure hypercholesterolemia, unspecified: Secondary | ICD-10-CM

## 2011-06-28 NOTE — Telephone Encounter (Signed)
Fu call °Patient returning your call °

## 2011-06-28 NOTE — Telephone Encounter (Signed)
Pt aware of LIPID results by phone. Reminder placed in system for repeat lipid and liver in September. The pt will focus on weight loss.

## 2011-09-07 ENCOUNTER — Other Ambulatory Visit: Payer: Self-pay | Admitting: Cardiology

## 2011-09-18 ENCOUNTER — Other Ambulatory Visit: Payer: Self-pay | Admitting: Family Medicine

## 2011-11-09 ENCOUNTER — Ambulatory Visit: Payer: Medicare Other | Admitting: Family Medicine

## 2011-12-21 ENCOUNTER — Ambulatory Visit (INDEPENDENT_AMBULATORY_CARE_PROVIDER_SITE_OTHER): Payer: Medicare Other | Admitting: Family Medicine

## 2011-12-21 ENCOUNTER — Encounter: Payer: Self-pay | Admitting: Family Medicine

## 2011-12-21 ENCOUNTER — Ambulatory Visit (INDEPENDENT_AMBULATORY_CARE_PROVIDER_SITE_OTHER): Payer: Medicare Other | Admitting: *Deleted

## 2011-12-21 VITALS — BP 110/70 | Temp 98.2°F | Wt 246.0 lb

## 2011-12-21 DIAGNOSIS — Z23 Encounter for immunization: Secondary | ICD-10-CM

## 2011-12-21 DIAGNOSIS — R42 Dizziness and giddiness: Secondary | ICD-10-CM

## 2011-12-21 DIAGNOSIS — E7800 Pure hypercholesterolemia, unspecified: Secondary | ICD-10-CM

## 2011-12-21 DIAGNOSIS — R0989 Other specified symptoms and signs involving the circulatory and respiratory systems: Secondary | ICD-10-CM

## 2011-12-21 LAB — HEPATIC FUNCTION PANEL
Albumin: 3.8 g/dL (ref 3.5–5.2)
Total Protein: 6.9 g/dL (ref 6.0–8.3)

## 2011-12-21 LAB — LIPID PANEL
LDL Cholesterol: 96 mg/dL (ref 0–99)
Total CHOL/HDL Ratio: 3
VLDL: 10.6 mg/dL (ref 0.0–40.0)

## 2011-12-21 MED ORDER — DIAZEPAM 2 MG PO TABS: ORAL_TABLET | ORAL | Status: DC

## 2011-12-21 NOTE — Patient Instructions (Signed)
Rest at home  Valium 2 mg,,,,,,,,, one tablet 3 times daily  Return when necessary

## 2011-12-21 NOTE — Progress Notes (Signed)
  Subjective:    Patient ID: Hunter Young, male    DOB: 1941/01/24, 71 y.o.   MRN: [id number]  HPI Hunter Young is a 71 year old male who comes in today for evaluation of vertigo  He's had 2 episodes in his life of vertigo the last was 4. He woke up on Sunday morning tried to get out of bed in the room is spinning. He was able to go to work but because of the severe nature of urticaria to go home. He vomited twice on the way home. Since then he spent at rest at home he feels better but still a little dizzy. Hearing normal neurologic review of systems negative   Review of Systems    general and neurologic review of systems otherwise negative Objective:   Physical Exam Well-developed well-nourished male in no acute distress HEENT negative neck was supple neurologic examination  Oriented x3 cranial nerves II through XII intact are intact his sensation strength reflexes and cerebellar testing are all within normal limits       Assessment & Plan:  Benign positional vertigo plan bed rest and a trial empirically of Valium 2 mg 3 times daily

## 2011-12-21 NOTE — Addendum Note (Signed)
Addended by: Kern Reap B on: 12/21/2011 09:51 AM   Modules accepted: Orders

## 2011-12-28 ENCOUNTER — Other Ambulatory Visit: Payer: Medicare Other

## 2012-01-11 ENCOUNTER — Encounter: Payer: Self-pay | Admitting: Family Medicine

## 2012-01-11 ENCOUNTER — Ambulatory Visit (INDEPENDENT_AMBULATORY_CARE_PROVIDER_SITE_OTHER): Payer: Medicare Other | Admitting: Family Medicine

## 2012-01-11 VITALS — BP 142/92 | Temp 97.9°F | Wt 248.0 lb

## 2012-01-11 DIAGNOSIS — E785 Hyperlipidemia, unspecified: Secondary | ICD-10-CM

## 2012-01-11 MED ORDER — ATORVASTATIN CALCIUM 40 MG PO TABS: 40.0000 mg | ORAL_TABLET | Freq: Every day | ORAL | Status: DC

## 2012-01-11 NOTE — Progress Notes (Signed)
  Subjective:    Patient ID: Hunter Young, male    DOB: 24-Jul-1940, 71 y.o.   MRN: [id number]  HPI Hunter Young is a 71 year old male who comes in today discuss his treatment for hyperlipidemia  In reviewing his electronic medical record his Zocor was switched to 40 mg of Lipitor last November by Dr. Riley Kill. Unfortunately the patient did not pick up the new prescription and has continued to take the 80 mg of Zocor. We discussed potential side effects from the 80 mg of Zocor and sent a new prescription for the 40 mg of Lipitor   Review of Systems    general and metabolic review of systems otherwise negative Objective:   Physical Exam  Well-developed well-nourished male in no acute distress      Assessment & Plan:  Hyperlipidemia Lipitor 40 mg each bedtime followup lipid and liver panel in 3 months

## 2012-01-11 NOTE — Patient Instructions (Signed)
Stop the simvastatin  Lipitor 40 mg,,,,,,,,,,, one tablet daily at bedtime  Followup office visit in 2 months labs one week prior

## 2012-01-25 ENCOUNTER — Encounter: Payer: Self-pay | Admitting: Gastroenterology

## 2012-02-27 ENCOUNTER — Other Ambulatory Visit: Payer: Self-pay | Admitting: Family Medicine

## 2012-03-05 ENCOUNTER — Other Ambulatory Visit: Payer: Medicare Other

## 2012-03-06 ENCOUNTER — Other Ambulatory Visit (INDEPENDENT_AMBULATORY_CARE_PROVIDER_SITE_OTHER): Payer: Medicare Other

## 2012-03-06 DIAGNOSIS — E785 Hyperlipidemia, unspecified: Secondary | ICD-10-CM

## 2012-03-06 LAB — HEPATIC FUNCTION PANEL
ALT: 23 U/L (ref 0–53)
AST: 25 U/L (ref 0–37)
Albumin: 3.9 g/dL (ref 3.5–5.2)
Alkaline Phosphatase: 62 U/L (ref 39–117)
Bilirubin, Direct: 0.1 mg/dL (ref 0.0–0.3)
Total Protein: 7.2 g/dL (ref 6.0–8.3)

## 2012-03-06 LAB — LIPID PANEL
Cholesterol: 161 mg/dL (ref 0–200)
LDL Cholesterol: 95 mg/dL (ref 0–99)
Triglycerides: 44 mg/dL (ref 0.0–149.0)

## 2012-03-12 ENCOUNTER — Ambulatory Visit: Payer: Medicare Other | Admitting: Family Medicine

## 2012-04-29 ENCOUNTER — Other Ambulatory Visit: Payer: Self-pay | Admitting: Family Medicine

## 2012-05-30 ENCOUNTER — Ambulatory Visit (INDEPENDENT_AMBULATORY_CARE_PROVIDER_SITE_OTHER): Payer: Medicare Other | Admitting: Family Medicine

## 2012-05-30 ENCOUNTER — Encounter: Payer: Self-pay | Admitting: Family Medicine

## 2012-05-30 ENCOUNTER — Ambulatory Visit: Payer: Medicare Other | Admitting: Family Medicine

## 2012-05-30 VITALS — BP 136/82 | HR 48 | Wt 249.5 lb

## 2012-05-30 DIAGNOSIS — I499 Cardiac arrhythmia, unspecified: Secondary | ICD-10-CM

## 2012-05-30 DIAGNOSIS — J019 Acute sinusitis, unspecified: Secondary | ICD-10-CM

## 2012-05-30 DIAGNOSIS — I498 Other specified cardiac arrhythmias: Secondary | ICD-10-CM

## 2012-05-30 DIAGNOSIS — R42 Dizziness and giddiness: Secondary | ICD-10-CM

## 2012-05-30 DIAGNOSIS — R001 Bradycardia, unspecified: Secondary | ICD-10-CM | POA: Insufficient documentation

## 2012-05-30 MED ORDER — AMOXICILLIN-POT CLAVULANATE 875-125 MG PO TABS: 1.0000 | ORAL_TABLET | Freq: Two times a day (BID) | ORAL | Status: DC

## 2012-05-30 NOTE — Patient Instructions (Signed)
I do think this dizziness is coming from sinus infection - treat with course of antibiotic for 10 days. If not improving with this, return to see Dr. Tawanna Cooler. Your heart rate is slow today but it seems ok overall. Hold prednisone for now - hold pseudophed as well. Take medicine as prescribed: augmentin antibiotic Push fluids and plenty of rest. Nasal saline irrigation or neti pot to help drain sinuses. Let us know if fever >101.5, trouble opening/closing mouth, difficulty swallowing, or worsening - you may need to be seen again.

## 2012-05-30 NOTE — Assessment & Plan Note (Signed)
Given duration of sxs, will treat with augmentin course to cover bacterial sinusitis. Anticipate nonspecific dizziness due to sinusitis, not any central cause. No focal acute finding on neurological exam today Discussed red flags to seek urgent care or return for further evaluation. Rec stop prednisone which may have worsened dizziness.  Unsure how much he took.

## 2012-05-30 NOTE — Assessment & Plan Note (Signed)
Seems like chronic issue - denies any other concerning cardiac sxs. EKG today - sinus brady at rate of high 40s, normal axis, intervals, no acute ST/T changes.
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
## 2012-06-25 ENCOUNTER — Other Ambulatory Visit: Payer: Self-pay | Admitting: Family Medicine

## 2012-06-26 ENCOUNTER — Other Ambulatory Visit: Payer: Self-pay | Admitting: *Deleted

## 2012-06-26 DIAGNOSIS — I10 Essential (primary) hypertension: Secondary | ICD-10-CM

## 2012-06-26 MED ORDER — METOPROLOL TARTRATE 25 MG PO TABS: ORAL_TABLET | ORAL | Status: DC

## 2012-07-26 ENCOUNTER — Encounter: Payer: Self-pay | Admitting: Gastroenterology

## 2012-08-12 ENCOUNTER — Encounter: Payer: Self-pay | Admitting: Cardiovascular Disease

## 2012-08-12 ENCOUNTER — Ambulatory Visit (INDEPENDENT_AMBULATORY_CARE_PROVIDER_SITE_OTHER): Payer: Medicare Other | Admitting: Cardiovascular Disease

## 2012-08-12 VITALS — BP 122/72 | HR 52 | Ht 69.0 in | Wt 244.5 lb

## 2012-08-12 DIAGNOSIS — E669 Obesity, unspecified: Secondary | ICD-10-CM

## 2012-08-12 DIAGNOSIS — I498 Other specified cardiac arrhythmias: Secondary | ICD-10-CM

## 2012-08-12 DIAGNOSIS — I2510 Atherosclerotic heart disease of native coronary artery without angina pectoris: Secondary | ICD-10-CM

## 2012-08-12 DIAGNOSIS — R001 Bradycardia, unspecified: Secondary | ICD-10-CM

## 2012-08-12 DIAGNOSIS — E785 Hyperlipidemia, unspecified: Secondary | ICD-10-CM

## 2012-08-12 DIAGNOSIS — I10 Essential (primary) hypertension: Secondary | ICD-10-CM

## 2012-08-12 MED ORDER — NITROGLYCERIN 0.4 MG SL SUBL: 0.4000 mg | SUBLINGUAL_TABLET | SUBLINGUAL | Status: AC | PRN

## 2012-08-12 NOTE — Patient Instructions (Addendum)
Heart rate is slow Hold the evening metoprolol Monitor your heart rate  If it continues to run low, low to mid 50s, hold the morning metoprolol  If you continue to have chest tightness episodes, call the office, we would set up a stress test   Please call us if you have new issues that need to be addressed before your next appt.  Your physician wants you to follow-up in: 3 months.

## 2012-08-12 NOTE — Assessment & Plan Note (Signed)
He reports slow steady weight loss. We have encouraged continued exercise, careful diet management in an effort to lose weight.

## 2012-08-12 NOTE — Assessment & Plan Note (Signed)
Heart rate of high 40s several months ago, low 50s today. Shortness of breath getting worse, unable to exclude bradycardia as a contributor. We will decrease his metoprolol down to 12.5 mg daily, possibly holding the beta blocker.

## 2012-08-12 NOTE — Assessment & Plan Note (Signed)
Blood pressure is well controlled on today's visit. No changes made to the medications. 

## 2012-08-12 NOTE — Assessment & Plan Note (Signed)
Encouraged continued weight loss. Goal total cholesterol less than 150, LDL less than 70

## 2012-08-12 NOTE — Assessment & Plan Note (Signed)
Chest discomfort with some typical and atypical symptoms. We have suggested if symptoms persist that he call our office for stress test. Heart rate is very low. Uncertain if this is contributing to shortness of breath. We'll decrease his metoprolol dosing

## 2012-08-12 NOTE — Progress Notes (Signed)
Patient ID: Hunter Young, male    DOB: April 17, 1940, 72 y.o.   MRN: [id number]  HPI Comments: Hunter Young is a very pleasant 72 year old gentleman with a history of stenting to his left circumflex, last catheterization in 2007 showing 40% proximal LAD disease, 70-80% mid RCA disease, nondominant vessel. He presents for routine followup  He reports that he has had several rare episodes of chest tightness radiating from the right side of his chest, sometimes to the left of the chest, one-time down his left arm. Sometimes associated with exertion. He is uncertain if this feels the same as his prior anginal symptoms prior to stent placement of the left circumflex. He tries to stay active, weight has been slowly decreasing. Also reports having some shortness of breath with exertion. This has been a chronic issue, uncertain if it is worse recently.  EKG shows sinus bradycardia with rate 52 beats per minute, rare PVC, T wave abnormality in V5, V6, 3 and aVF    Outpatient Encounter Prescriptions as of 08/12/2012  Medication Sig Dispense Refill  . aspirin 81 MG tablet Take 81 mg by mouth daily.        Marland Kitchen atorvastatin (LIPITOR) 40 MG tablet Take 1 tablet (40 mg total) by mouth daily.  90 tablet  3  . lisinopril (PRINIVIL,ZESTRIL) 40 MG tablet TAKE 1 TABLET BY MOUTH DAILY  100 tablet  2  . metoprolol tartrate (LOPRESSOR) 25 MG tablet Take one-half tablet by mouth twice a day  90 tablet  3  . Multiple Vitamin (MULTIVITAMIN) tablet Take 1 tablet by mouth daily.        . nitroGLYCERIN (NITROSTAT) 0.4 MG SL tablet Place 1 tablet (0.4 mg total) under the tongue every 5 (five) minutes as needed.  25 tablet  6  . Omega-3 Fatty Acids (FISH OIL) 1000 MG CAPS Take by mouth daily.        Marland Kitchen omeprazole (PRILOSEC) 20 MG capsule Take 20 mg by mouth daily.       Marland Kitchen oxybutynin (DITROPAN) 5 MG tablet Take 5 mg by mouth 2 (two) times daily.          Review of Systems  Constitutional: Negative.   HENT: Negative.   Eyes:  Negative.   Respiratory: Positive for chest tightness.   Gastrointestinal: Negative.   Musculoskeletal: Negative.   Skin: Negative.   Neurological: Negative.   Psychiatric/Behavioral: Negative.   All other systems reviewed and are negative.    BP 122/72  Pulse 52  Ht 5\' 9"  (1.753 m)  Wt 244 lb 8 oz (110.904 kg)  BMI 36.09 kg/m2  Physical Exam  Nursing note and vitals reviewed. Constitutional: He is oriented to person, place, and time. He appears well-developed and well-nourished.  HENT:  Head: Normocephalic.  Nose: Nose normal.  Mouth/Throat: Oropharynx is clear and moist.  Eyes: Conjunctivae are normal. Pupils are equal, round, and reactive to light.  Neck: Normal range of motion. Neck supple. No JVD present.  Cardiovascular: Normal rate, regular rhythm, S1 normal, S2 normal, normal heart sounds and intact distal pulses.  Exam reveals no gallop and no friction rub.   No murmur heard. Pulmonary/Chest: Effort normal and breath sounds normal. No respiratory distress. He has no wheezes. He has no rales. He exhibits no tenderness.  Abdominal: Soft. Bowel sounds are normal. He exhibits no distension. There is no tenderness.  Musculoskeletal: Normal range of motion. He exhibits no edema and no tenderness.  Lymphadenopathy:    He has no cervical  adenopathy.  Neurological: He is alert and oriented to person, place, and time. Coordination normal.  Skin: Skin is warm and dry. No rash noted. No erythema.  Psychiatric: He has a normal mood and affect. His behavior is normal. Judgment and thought content normal.      Assessment and Plan

## 2012-08-13 ENCOUNTER — Encounter: Payer: Self-pay | Admitting: Gastroenterology

## 2012-08-16 ENCOUNTER — Telehealth: Payer: Self-pay | Admitting: *Deleted

## 2012-08-16 NOTE — Telephone Encounter (Signed)
2 day prep 

## 2012-08-16 NOTE — Telephone Encounter (Signed)
Noted  

## 2012-08-16 NOTE — Telephone Encounter (Signed)
Dr. Jarold Motto: pt is scheduled for recall colon 6/2.  Last colonoscopy 2008: Fair prep; exam compromised using Golylty.  Colonoscopy 2003 good exam using Golytly.  Do you want pt to have 2 day prep or standard MoviPrep?  Thanks, Olegario Messier

## 2012-08-21 ENCOUNTER — Ambulatory Visit (AMBULATORY_SURGERY_CENTER): Payer: Medicare Other | Admitting: *Deleted

## 2012-08-21 VITALS — Ht 69.0 in | Wt 245.4 lb

## 2012-08-21 DIAGNOSIS — Z1211 Encounter for screening for malignant neoplasm of colon: Secondary | ICD-10-CM

## 2012-08-21 MED ORDER — MOVIPREP 100 G PO SOLR: ORAL | Status: DC

## 2012-08-22 ENCOUNTER — Encounter: Payer: Self-pay | Admitting: Gastroenterology

## 2012-08-26 ENCOUNTER — Ambulatory Visit (AMBULATORY_SURGERY_CENTER): Payer: Medicare Other | Admitting: Gastroenterology

## 2012-08-26 ENCOUNTER — Encounter: Payer: Self-pay | Admitting: Gastroenterology

## 2012-08-26 VITALS — BP 139/78 | HR 44 | Temp 97.8°F | Resp 15 | Ht 69.0 in | Wt 245.0 lb

## 2012-08-26 DIAGNOSIS — Z1211 Encounter for screening for malignant neoplasm of colon: Secondary | ICD-10-CM

## 2012-08-26 DIAGNOSIS — Z800 Family history of malignant neoplasm of digestive organs: Secondary | ICD-10-CM

## 2012-08-26 DIAGNOSIS — K5730 Diverticulosis of large intestine without perforation or abscess without bleeding: Secondary | ICD-10-CM

## 2012-08-26 DIAGNOSIS — D126 Benign neoplasm of colon, unspecified: Secondary | ICD-10-CM

## 2012-08-26 MED ORDER — SODIUM CHLORIDE 0.9 % IV SOLN: 500.0000 mL | INTRAVENOUS | Status: DC

## 2012-08-26 NOTE — Op Note (Signed)
[facility] [address] [place], [address]   COLONOSCOPY PROCEDURE REPORT  PATIENT: Hunter Young, Hunter Young  MR#: [id number] BIRTHDATE: 01-Aug-1940 , 72  yrs. old GENDER: Male ENDOSCOPIST: Mardella Layman, MD, [facility] REFERRED BY: PROCEDURE DATE:  08/26/2012 PROCEDURE:   Colonoscopy with biopsy ASA CLASS:   Class III INDICATIONS:Patient's personal history of colon polyps and Patient's immediate family history of colon cancer. MEDICATIONS: propofol (Diprivan) 250mg  IV  DESCRIPTION OF PROCEDURE:   After the risks and benefits and of the procedure were explained, informed consent was obtained.  A digital rectal exam revealed no abnormalities of the rectum.    The [id number]  endoscope was introduced through the anus and advanced to the cecum, which was identified by both the appendix and ileocecal valve .  The quality of the prep was good, using MoviPrep .  The instrument was then slowly withdrawn as the colon was fully examined.     COLON FINDINGS: There was severe diverticulosis noted in the descending colon and sigmoid colon with associated muscular hypertrophy.   A smooth sessile polyp ranging between 3-54mm in size was found in the sigmoid colon.  A biopsy was performed using cold forceps.   The colon was otherwise normal.  There was no diverticulosis, inflammation, polyps or cancers unless previously stated.     Retroflexed views revealed no abnormalities.     The scope was then withdrawn from the patient and the procedure completed.  COMPLICATIONS: There were no complications. ENDOSCOPIC IMPRESSION: 1.   There was severe diverticulosis noted in the descending colon and sigmoid colon 2.   Sessile polyp ranging between 3-4mm in size was found in the sigmoid colon; biopsy was performed using cold forceps 3.   The colon was otherwise normal  RECOMMENDATIONS: 1.  Await pathology results 2.  Given your significant family history of colon cancer,  you should have a repeat colonoscopy in 5 years   REPEAT EXAM:  XB:[id number] Shawnie Dapper, MD  _______________________________ eSigned:  Mardella Layman, MD, [facility] 08/26/2012 9:23 AM     PATIENT NAME:  Hunter Young, Hunter Young MR#: [id number]

## 2012-08-26 NOTE — Progress Notes (Signed)
Called to room to assist during endoscopic procedure.  Patient ID and intended procedure confirmed with present staff. Received instructions for my participation in the procedure from the performing physician.  

## 2012-08-26 NOTE — Patient Instructions (Addendum)

## 2012-08-26 NOTE — Progress Notes (Signed)
Patient did not experience any of the following events: a burn prior to discharge; a fall within the facility; wrong site/side/patient/procedure/implant event; or a hospital transfer or hospital admission upon discharge from the facility. (G8907) Patient did not have preoperative order for IV antibiotic SSI prophylaxis. (G8918)  

## 2012-08-27 ENCOUNTER — Telehealth: Payer: Self-pay

## 2012-08-27 NOTE — Telephone Encounter (Signed)
Left message on answering machine. 

## 2012-09-02 ENCOUNTER — Encounter: Payer: Self-pay | Admitting: Gastroenterology

## 2012-09-27 ENCOUNTER — Other Ambulatory Visit: Payer: Self-pay | Admitting: Family Medicine

## 2012-09-28 ENCOUNTER — Other Ambulatory Visit: Payer: Self-pay | Admitting: Family Medicine

## 2012-10-22 ENCOUNTER — Telehealth: Payer: Self-pay | Admitting: Cardiology

## 2012-10-22 NOTE — Telephone Encounter (Signed)
New Prob      Pt wanting to speak to nurse regarding a new recommendation for a PCP. Pt stated he preferred to speak to Dr. Louretta Shorten nurse instead of his new cardiologist in [place].

## 2012-10-22 NOTE — Telephone Encounter (Signed)
Left message on machine for pt to contact the office.   

## 2012-10-23 NOTE — Telephone Encounter (Signed)
The pt called Dr Nelida Meuse office and he could not get an appointment until October/November. The pt is interested in switching to a new PCP mainly because he lives in [place] and would like to have a PCP closer to home. The pt would like to get Dr Rosalyn Charters recommendation about a PCP in the [facility] or [place] area.  I will contact the pt after speaking with Dr Riley Kill.

## 2012-10-23 NOTE — Telephone Encounter (Signed)
Follow up ° ° °Pt returned your call °

## 2012-10-29 NOTE — Telephone Encounter (Signed)
Per Dr Riley Kill he would recommend Dr Karleen Hampshire Copland or Dr Crawford Givens at the [facility] office. He would also recommend Dr Alphonsus Sias if he is taking new patients.  I spoke with the pt and made him aware of this information.

## 2012-11-15 ENCOUNTER — Encounter: Payer: Self-pay | Admitting: Cardiovascular Disease

## 2012-11-15 ENCOUNTER — Ambulatory Visit (INDEPENDENT_AMBULATORY_CARE_PROVIDER_SITE_OTHER): Payer: Medicare Other | Admitting: Cardiovascular Disease

## 2012-11-15 VITALS — BP 128/70 | HR 61 | Ht 69.0 in | Wt 252.0 lb

## 2012-11-15 DIAGNOSIS — M79602 Pain in left arm: Secondary | ICD-10-CM

## 2012-11-15 DIAGNOSIS — M79609 Pain in unspecified limb: Secondary | ICD-10-CM

## 2012-11-15 DIAGNOSIS — E669 Obesity, unspecified: Secondary | ICD-10-CM

## 2012-11-15 DIAGNOSIS — R079 Chest pain, unspecified: Secondary | ICD-10-CM

## 2012-11-15 DIAGNOSIS — E785 Hyperlipidemia, unspecified: Secondary | ICD-10-CM

## 2012-11-15 DIAGNOSIS — I2510 Atherosclerotic heart disease of native coronary artery without angina pectoris: Secondary | ICD-10-CM

## 2012-11-15 DIAGNOSIS — I10 Essential (primary) hypertension: Secondary | ICD-10-CM

## 2012-11-15 MED ORDER — ATORVASTATIN CALCIUM 80 MG PO TABS: 80.0000 mg | ORAL_TABLET | Freq: Every day | ORAL | Status: DC

## 2012-11-15 NOTE — Assessment & Plan Note (Signed)
We have encouraged continued exercise, careful diet management in an effort to lose weight. 

## 2012-11-15 NOTE — Patient Instructions (Addendum)
You are doing well. Please increase the atorvastatin to 80 mg daily If cramping gets worse, call the office, go back to 40 mg daily Take potassium and magnesium for cramps  Call the office if you start having more chest pain, We might do a cardiac cath  Please call us if you have new issues that need to be addressed before your next appt.  Your physician wants you to follow-up in: 6 months.  You will receive a reminder letter in the mail two months in advance. If you don't receive a letter, please call our office to schedule the follow-up appointment.

## 2012-11-15 NOTE — Assessment & Plan Note (Signed)
Recent episodes of chest pain. Known CAD from prior cardiac catheterization. He prefers to monitor his symptoms for now, if additional episodes of chest pain, would order cardiac catheterization

## 2012-11-15 NOTE — Progress Notes (Signed)
Patient ID: Hunter Young, male    DOB: April 22, 1940, 72 y.o.   MRN: [id number]  HPI Comments: Hunter Young is a very pleasant 72 year old gentleman with a history of stenting to his left circumflex, last catheterization in 2007 showing 40% proximal LAD disease, 70-80% mid RCA disease, nondominant vessel. He presents for routine followup  On previous visits, he has had rare episodes of chest pain. Today he reports having episode of chest pain in June of 2014. He took nitroglycerin. Symptoms resolved. Also had episode of chest tightness last week while he was doing weed eating. He stopped and symptoms went away, he started working again and symptoms came back. He stopped and symptoms resolved. Since then he's had no further episodes.  The Toprol was weaned off secondary to low heart rate  EKG shows sinus bradycardia with rate 61 beats per minute,  PVCs, nonspecific T wave abnormality    Outpatient Encounter Prescriptions as of 11/15/2012  Medication Sig Dispense Refill  . aspirin 81 MG tablet Take 81 mg by mouth daily.        Marland Kitchen atorvastatin (LIPITOR) 80 MG tablet Take 1 tablet (80 mg total) by mouth daily.  90 tablet  3  . lisinopril (PRINIVIL,ZESTRIL) 40 MG tablet TAKE 1 TABLET BY MOUTH DAILY  100 tablet  2  . Multiple Vitamin (MULTIVITAMIN) tablet Take 1 tablet by mouth daily.        . nitroGLYCERIN (NITROSTAT) 0.4 MG SL tablet Place 1 tablet (0.4 mg total) under the tongue every 5 (five) minutes as needed.  25 tablet  6  . Omega-3 Fatty Acids (FISH OIL) 1000 MG CAPS Take by mouth daily.        Marland Kitchen omeprazole (PRILOSEC) 20 MG capsule TAKE ONE CAPSULE BY MOUTH EVERY DAY  100 capsule  0  . oxybutynin (DITROPAN) 5 MG tablet TAKE 1 TABLET BY MOUTH TWICE DAILY  200 tablet  0   Review of Systems  Constitutional: Negative.   HENT: Negative.   Eyes: Negative.   Respiratory: Positive for chest tightness.   Cardiovascular: Negative.   Gastrointestinal: Negative.   Musculoskeletal: Negative.   Skin:  Negative.   Neurological: Negative.   Psychiatric/Behavioral: Negative.   All other systems reviewed and are negative.    BP 128/70  Pulse 61  Ht 5\' 9"  (1.753 m)  Wt 252 lb (114.306 kg)  BMI 37.2 kg/m2  Physical Exam  Nursing note and vitals reviewed. Constitutional: He is oriented to person, place, and time. He appears well-developed and well-nourished.  HENT:  Head: Normocephalic.  Nose: Nose normal.  Mouth/Throat: Oropharynx is clear and moist.  Eyes: Conjunctivae are normal. Pupils are equal, round, and reactive to light.  Neck: Normal range of motion. Neck supple. No JVD present.  Cardiovascular: Normal rate, regular rhythm, S1 normal, S2 normal, normal heart sounds and intact distal pulses.  Exam reveals no gallop and no friction rub.   No murmur heard. Pulmonary/Chest: Effort normal and breath sounds normal. No respiratory distress. He has no wheezes. He has no rales. He exhibits no tenderness.  Abdominal: Soft. Bowel sounds are normal. He exhibits no distension. There is no tenderness.  Musculoskeletal: Normal range of motion. He exhibits no edema and no tenderness.  Lymphadenopathy:    He has no cervical adenopathy.  Neurological: He is alert and oriented to person, place, and time. Coordination normal.  Skin: Skin is warm and dry. No rash noted. No erythema.  Psychiatric: He has a normal mood and affect.  His behavior is normal. Judgment and thought content normal.      Assessment and Plan

## 2012-11-15 NOTE — Assessment & Plan Note (Addendum)
Cholesterol above goal. Goal LDL less than 70 . Suggested he increase his Lipitor up to 80 mg daily . If he has worsening cramping, would go back to 40 mg daily .

## 2012-11-15 NOTE — Assessment & Plan Note (Signed)
Recent episodes of chest pain. Some with exertion, some at rest. Discussed various treatment options. Given his previous known severe RCA disease, stress test would probably be already positive. We'll probably go with cardiac catheterization if he has further episodes of chest pain.

## 2012-11-15 NOTE — Assessment & Plan Note (Signed)
Blood pressure is well controlled on today's visit. No changes made to the medications. 

## 2012-11-17 ENCOUNTER — Ambulatory Visit: Payer: Self-pay

## 2012-11-18 ENCOUNTER — Encounter: Payer: Self-pay | Admitting: Internal Medicine

## 2012-11-18 ENCOUNTER — Telehealth: Payer: Self-pay | Admitting: Family Medicine

## 2012-11-18 ENCOUNTER — Ambulatory Visit (INDEPENDENT_AMBULATORY_CARE_PROVIDER_SITE_OTHER): Payer: Medicare Other | Admitting: Internal Medicine

## 2012-11-18 VITALS — BP 154/82 | Temp 97.5°F | Wt 250.0 lb

## 2012-11-18 DIAGNOSIS — H8110 Benign paroxysmal vertigo, unspecified ear: Secondary | ICD-10-CM

## 2012-11-18 MED ORDER — DIAZEPAM 5 MG PO TABS: 2.5000 mg | ORAL_TABLET | Freq: Two times a day (BID) | ORAL | Status: DC | PRN

## 2012-11-18 NOTE — Progress Notes (Signed)
Patient had acute headache and dizziness on 11/15/12- i have reviewed records form [facility]. He continues to have diffuse headache. And still has some dizziness/vertigo Headache is better with sleep but recurs when he stands or moves. Reviewed labs from [facility] Esr = 4  Spinal fluid- Component Name  11/15/2012 11/15/2012    1 4  COLORLESS COLORLESS  CLEAR CLEAR  1 <1  1 <1  69 50  31 50  13 20   58   32  Reviewed ct head- Reviewed mri head   Reviewed pmh, psh, sochx, meds Ros- no pain other than headache, no other concerns  Exam- reviewed vitals  well-developed well-nourished male in no acute distress. HEENT exam atraumatic, normocephalic, neck supple without jugular venous distention. No obvious nystagmus. Chest clear to auscultation cardiac exam S1-S2 are regular. Abdominal exam overweight with bowel sounds, soft and nontender. Extremities no edema. Neurologic exam is alert with a normal gait.   A/p: vertigo with headache Trial diazepam (he has used previously with success). Refer to vestibular rehab

## 2012-11-18 NOTE — Telephone Encounter (Signed)
[facility] Triage Call Report Triage Record Num: [id number] Operator: Geanie Berlin Patient Name: Hunter Young Call Date & Time: 11/17/2012 11:46:11AM Patient Phone: [phone] PCP: Eugenio Hoes. Todd Patient Gender: Male PCP Fax : [phone] Patient DOB: [birth year] Practice Name: [facility] Reason for Call: Caller: Betty/Spouse; PCP: Kelle Darting [facility]); CB#: [phone]; Call regarding Dizzy ; Onset: 11/15/12. Afebrile. Dull headache present now; but had severe headache at onset of dizziness. History of vertigo; last episode 12/20/12 and treated with Diazepam. No ear pain or cold symptoms. Advised BP elevation can cause dizziness and headache so should be examined. Advised to see [facility] now for sensation of turning or spinning that affects balance and not responsive to 4 hours of home care per Dizziness or Vertigo. Declined recommendation to go to UC; requested appointment for 11/18/12. Scheduled for 0845 11/18/12 wtih Dr Cato Mulligan per caller request. Protocol(s) Used: Dizziness or Vertigo Recommended Outcome per Protocol: See Provider within 4 hours Reason for Outcome: Having sensations of turning or spinning that affects balance AND not responsive to 4 hours of home care Care Advice: Call EMS 911 if patient develops confusion, decreased level of consciousness, chest pain, shortness of breath, or focal neurologic abnormalities such as facial droop or weakness of one extremity. ~ ~ Call provider if symptoms worsen or new symptoms develop. Avoid caffeine (coffee, tea, cola drinks, or chocolate), alcohol, and nicotine (use of tobacco), as use of these substances may worsen symptoms. ~ ~ SYMPTOM / CONDITION MANAGEMENT Consider taking nonprescription motion sickness medication (Antivert, Dramamine) according to package or pharmacist's directions. ~ ~ Lorenz Coaster still in a dimly lit room and avoid any sudden change in position. 08/

## 2012-11-20 ENCOUNTER — Telehealth: Payer: Self-pay | Admitting: Family Medicine

## 2012-11-20 ENCOUNTER — Encounter: Payer: Self-pay | Admitting: Family Medicine

## 2012-11-20 ENCOUNTER — Ambulatory Visit (INDEPENDENT_AMBULATORY_CARE_PROVIDER_SITE_OTHER): Payer: Medicare Other | Admitting: Family Medicine

## 2012-11-20 VITALS — BP 140/68 | HR 55 | Temp 97.5°F | Wt 250.0 lb

## 2012-11-20 DIAGNOSIS — R51 Headache: Secondary | ICD-10-CM

## 2012-11-20 DIAGNOSIS — R42 Dizziness and giddiness: Secondary | ICD-10-CM

## 2012-11-20 NOTE — Telephone Encounter (Signed)
Patient Information:  Caller Name: Yianni  Phone: [phone]  Patient: Hunter Young, Hunter Young  Gender: Male  DOB: Mar 27, 1941  Age: 72 Years  PCP: Birdie Sons (Adults only)  Office Follow Up:  Does the office need to follow up with this patient?: Yes  Instructions For The Office: Call back needed ASAP; see note  RN Note:  Ongoing positional headache; severe when upright and moderate when supine.  Able to sleep.  Pain has not improvement with Diazepam.  Vertigo improved but still present. No BP machine at home. Declined to go back to ED since just seen 11/15/12; does not want to pay for the same tests or spend 10 hours in the ED.  Please call back ASAP to advise if RX will be ordered or if MD orders ED evaluation. [facility] [place].  Symptoms  Reason For Call & Symptoms: Severe generalized headache when upright rated 10/10.  Pain worsens when coughs.  Headache significanly  lessened if lies down; rated 4-5/10.  Seen in [facility] ED 11/15/12 with numerous tests (MRI, spinal tap, etc).  Had ED follow up with Dr Cato Mulligan 11/18/12.  Asking for something to help the headache.  Reviewed Health History In EMR: Yes  Reviewed Medications In EMR: Yes  Reviewed Allergies In EMR: Yes  Reviewed Surgeries / Procedures: Yes  Date of Onset of Symptoms: 11/15/2012  Treatments Tried: Diazepam 5 mg; 1/2-1 tab po every 12 hrs  Treatments Tried Worked: No  Guideline(s) Used:  Headache  Disposition Per Guideline:   Go to ED Now (or to Office with PCP Approval)  Reason For Disposition Reached:   Severe headache, states "worst headache" of life  Advice Given:  N/A  Patient Refused Recommendation:  Patient Will Follow Up With Office Later  Please call back ASAP regarding disposition for severe headache.

## 2012-11-20 NOTE — Telephone Encounter (Signed)
Patient has an appointment with Dr Caryl Never this afternoon

## 2012-11-20 NOTE — Patient Instructions (Addendum)
Try over the counter Aleve twice daily Follow up promptly for any fever, vomiting, confusion, seizure, or worsening headache Also, call by Friday if headache no better.

## 2012-11-20 NOTE — Progress Notes (Signed)
Subjective:    Patient ID: Hunter Young, male    DOB: December 08, 1940, 72 y.o.   MRN: [id number]  HPI Patient here for ER followup. He was at [facility] last Friday picking his wife up from work when he developed sudden diffuse headache while driving there. Some bilateral blurred vision. He was seen in emergency department there. He had extensive workup including CT of head, MRI, MRA angiogram all unremarkable. Initially, they had concern for possible subarachnoid hemorrhage. Lumbar puncture was unremarkable. Sedimentation rate 4. Lab work basically unremarkable. Patient also has had some intermittent vertigo since then. 2 days ago prescribed diazepam which may have helped slightly with vertigo but headache is not much better. No acute hearing loss.  Some chronic tinnitus. He did initially have headache 8/10 severity and currently 5/10. Headache is diffuse.  Symptoms worse with movement. No recent tick bite. No fever. No skin rash. No photophobia. No stiff neck. No nausea or vomiting though he did initially have some nausea on Friday Patient is not taking any medications for the headache  Past Medical History  Diagnosis Date  . ALLERGIC RHINITIS 04/22/2007  . ASTHMA, INTRINSIC NOS 12/27/2006  . BENIGN PROSTATIC HYPERTROPHY, WITH URINARY OBSTRUCTION 06/25/2007  . Cardiomegaly 05/30/2007  . CERVICAL DISORDER, NOS 10/14/2009  . CORONARY ARTERY DISEASE 12/03/2006  . DIVERTICULOSIS OF COLON 05/20/2001  . Dyshidrosis 06/25/2007  . GERD 12/06/2007  . HYPERLIPIDEMIA 12/03/2006  . HYPERTENSION 12/03/2006  . INTERNAL HEMORRHOIDS 05/20/2001  . OBESITY, MODERATE 12/05/2008  . OSTEOARTHRITIS 06/25/2007  . SNORING 11/06/2007  . VENOUS INSUFFICIENCY 12/04/2006   Past Surgical History  Procedure Laterality Date  . Foot surgery    . Tonsillectomy    . Joint replacement      bilat knee  . Angioplasty      2 stents    reports that he quit smoking about 32 years ago. He has never used smokeless tobacco. He reports  that he drinks about 10.5 ounces of alcohol per week. He reports that he does not use illicit drugs. family history includes Colon cancer (age of onset: 64) in his mother. Allergies  Allergen Reactions  . Oxycodone-Aspirin Nausea And Vomiting      Review of Systems  Constitutional: Negative for fever and chills.  Eyes: Negative for visual disturbance.  Respiratory: Negative for cough and shortness of breath.   Cardiovascular: Negative for chest pain.  Neurological: Positive for dizziness and headaches. Negative for seizures, syncope and weakness.  Hematological: Negative for adenopathy.       Objective:   Physical Exam  Constitutional: He is oriented to person, place, and time. He appears well-developed and well-nourished.  HENT:  Right Ear: External ear normal.  Left Ear: External ear normal.  Mouth/Throat: Oropharynx is clear and moist.  Eyes: Pupils are equal, round, and reactive to light.  Cardiovascular: Normal rate and regular rhythm.   Pulmonary/Chest: Effort normal and breath sounds normal. No respiratory distress. He has no wheezes. He has no rales.  Neurological: He is alert and oriented to person, place, and time. No cranial nerve deficit.  Gait is normal. Cerebellar function is normal. No focal strength deficits  Psychiatric: He has a normal mood and affect. His behavior is normal.          Assessment & Plan:  Atypical headache. Patient had extensive workup as above unrevealing. He denies any fever or any focal neurologic concerns. He has had some intermittent vertigo with nonfocal exam. He's been scheduled for vestibular rehabilitation.  We have suggested Aleve for his headache. Followup promptly for any fever, vomiting, or any worsening headache.  Consider neurology assessment if headaches not improving over the next couple of days with anti-inflammatory medication

## 2012-12-04 ENCOUNTER — Telehealth: Payer: Self-pay | Admitting: Family Medicine

## 2012-12-04 DIAGNOSIS — R42 Dizziness and giddiness: Secondary | ICD-10-CM

## 2012-12-04 DIAGNOSIS — H8110 Benign paroxysmal vertigo, unspecified ear: Secondary | ICD-10-CM

## 2012-12-04 NOTE — Telephone Encounter (Signed)
Already taken care of

## 2012-12-04 NOTE — Telephone Encounter (Signed)
Dr Cato Mulligan placed order for PT for vestibular rehab.  Can we check to see why this has not been done.

## 2012-12-04 NOTE — Telephone Encounter (Signed)
I dont see that referral was put in.

## 2012-12-04 NOTE — Telephone Encounter (Signed)
Per Dr Cato Mulligan office note pt needs referral to vestibular rehab for vertigo.  Referral was placed but was under future.  Released the future order

## 2012-12-04 NOTE — Telephone Encounter (Signed)
Pt seen on 8/25 by dr swords and fup w/ dr Caryl Never. 8/27.  pt was told he would have a referral to  According to notes "Refer to vestibular rehab" It has been 3 weeks and pt following up on this. pls advise.

## 2012-12-16 ENCOUNTER — Encounter: Payer: Self-pay | Admitting: Family Medicine

## 2012-12-16 ENCOUNTER — Ambulatory Visit (INDEPENDENT_AMBULATORY_CARE_PROVIDER_SITE_OTHER): Payer: Medicare Other | Admitting: Family Medicine

## 2012-12-16 VITALS — BP 126/72 | HR 70 | Temp 97.9°F | Ht 66.0 in | Wt 252.2 lb

## 2012-12-16 DIAGNOSIS — I2510 Atherosclerotic heart disease of native coronary artery without angina pectoris: Secondary | ICD-10-CM

## 2012-12-16 DIAGNOSIS — R51 Headache: Secondary | ICD-10-CM

## 2012-12-16 DIAGNOSIS — I10 Essential (primary) hypertension: Secondary | ICD-10-CM

## 2012-12-16 DIAGNOSIS — Z23 Encounter for immunization: Secondary | ICD-10-CM

## 2012-12-16 DIAGNOSIS — H538 Other visual disturbances: Secondary | ICD-10-CM

## 2012-12-16 DIAGNOSIS — E785 Hyperlipidemia, unspecified: Secondary | ICD-10-CM

## 2012-12-16 NOTE — Patient Instructions (Addendum)
REFERRAL: GO THE THE FRONT ROOM AT THE ENTRANCE OF OUR CLINIC, NEAR CHECK IN. ASK FOR MARION. SHE WILL HELP YOU SET UP YOUR REFERRAL. DATE: TIME:  

## 2012-12-16 NOTE — Progress Notes (Signed)
Nature conservation officer at [facility] [address] [place] [address] Phone: [phone] Fax: [phone]  Date:  12/16/2012   Name:  Hunter Young   DOB:  1940-09-28   MRN:  [id number] Gender: male Age: 72 y.o.  Primary Physician:  Evette Georges, MD  Evaluating MD: Hannah Beat, MD   Chief Complaint: Annual Exam   History of Present Illness:  Hunter Young is a 72 y.o. pleasant patient who presents with the following:  New patient - has been seeing Alonza Smoker for a long time, who presents to establish care, but he has also had an acute issue several weeks ago and was evaluated at [facility] Emergency Room. He has a very significant history for CAD as well as PCI x 2 in the past. He also has significant HTN, HLD, but he is on appropropriate cardiac medications and is compliant. He had a severe headache and blurred vision, so severe that he had an ICH work-up at [facility], stroke work-up with a normal CT, LP, and MRI with no evidence of hemorrhage or CVA. The blurred vision resolved and his headache has improved. He did have a recurrence of HA a few days ago, but this resolved when he took some advil and a valium.  He denies true vertigo to me.  Previously seen at [facility] with a ? Of BPPV?  At the time prior to the evaluation in the emergency room, the patient had a severe headache, blurred vision.  Wife: Works for Dr. Beckey Rutter one of the cancer doctors.   Works part time for [employer].  Runs hub to [place]. 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  In trucking business for a long time.   Headache and blurred vision.     Patient Active Problem List   Diagnosis Date Noted  . Bradycardia 05/30/2012  . CERVICAL DISORDER, NOS 10/14/2009  . OBESITY, MODERATE 12/05/2008  . GERD 12/06/2007  . BENIGN PROSTATIC HYPERTROPHY, WITH URINARY OBSTRUCTION 06/25/2007  . OSTEOARTHRITIS 06/25/2007  . CARDIOMEGALY 05/30/2007  . ALLERGIC RHINITIS 04/22/2007  . VENOUS INSUFFICIENCY 12/04/2006  .  HYPERLIPIDEMIA 12/03/2006  . HYPERTENSION 12/03/2006  . CORONARY ARTERY DISEASE 12/03/2006  . DIVERTICULOSIS OF COLON 05/20/2001    Past Medical History  Diagnosis Date  . ALLERGIC RHINITIS 04/22/2007  . ASTHMA, INTRINSIC NOS 12/27/2006  . BENIGN PROSTATIC HYPERTROPHY, WITH URINARY OBSTRUCTION 06/25/2007  . Cardiomegaly 05/30/2007  . CERVICAL DISORDER, NOS 10/14/2009  . CORONARY ARTERY DISEASE 12/03/2006  . DIVERTICULOSIS OF COLON 05/20/2001  . Dyshidrosis 06/25/2007  . GERD 12/06/2007  . HYPERLIPIDEMIA 12/03/2006  . HYPERTENSION 12/03/2006  . INTERNAL HEMORRHOIDS 05/20/2001  . OBESITY, MODERATE 12/05/2008  . OSTEOARTHRITIS 06/25/2007  . SNORING 11/06/2007  . VENOUS INSUFFICIENCY 12/04/2006    Past Surgical History  Procedure Laterality Date  . Foot surgery    . Tonsillectomy    . Joint replacement      bilat knee  . Angioplasty      2 stents    History   Social History  . Marital Status: Married    Spouse Name: N/A    Number of Children: N/A  . Years of Education: N/A   Occupational History  . Not on file.   Social History Main Topics  . Smoking status: Former Smoker    Quit date: 08/21/1980  . Smokeless tobacco: Never Used  . Alcohol Use: 10.5 oz/week    21 drink(s) per week  . Drug Use: No  . Sexual Activity: Not on file  Other Topics Concern  . Not on file   Social History Narrative  . No narrative on file    Family History  Problem Relation Age of Onset  . Colon cancer Mother 39    Allergies  Allergen Reactions  . Oxycodone-Aspirin Nausea And Vomiting    Medication list has been reviewed and updated.  Outpatient Prescriptions Prior to Visit  Medication Sig Dispense Refill  . aspirin 81 MG tablet Take 81 mg by mouth daily.        Marland Kitchen atorvastatin (LIPITOR) 80 MG tablet Take 1 tablet (80 mg total) by mouth daily.  90 tablet  3  . diazepam (VALIUM) 5 MG tablet Take 0.5-1 tablets (2.5-5 mg total) by mouth every 12 (twelve) hours as needed (vertigo and  headache).  30 tablet  0  . lisinopril (PRINIVIL,ZESTRIL) 40 MG tablet TAKE 1 TABLET BY MOUTH DAILY  100 tablet  2  . Multiple Vitamin (MULTIVITAMIN) tablet Take 1 tablet by mouth daily.        . nitroGLYCERIN (NITROSTAT) 0.4 MG SL tablet Place 1 tablet (0.4 mg total) under the tongue every 5 (five) minutes as needed.  25 tablet  6  . Omega-3 Fatty Acids (FISH OIL) 1000 MG CAPS Take by mouth daily.        Marland Kitchen omeprazole (PRILOSEC) 20 MG capsule TAKE ONE CAPSULE BY MOUTH EVERY DAY  100 capsule  0  . oxybutynin (DITROPAN) 5 MG tablet TAKE 1 TABLET BY MOUTH TWICE DAILY  200 tablet  0   No facility-administered medications prior to visit.    Review of Systems:   GEN: No acute illnesses, no fevers, chills. GI: No n/v/d, eating normally Pulm: No SOB Neuro symptoms as above. No current forgetfulness, balance problems. Interactive and getting along well at home.  Otherwise, ROS is as per the HPI.   Physical Examination: BP 126/72  Pulse 70  Temp(Src) 97.9 F (36.6 C) (Oral)  Ht 5\' 6"  (1.676 m)  Wt 252 lb 4 oz (114.42 kg)  BMI 40.73 kg/m2  Ideal Body Weight: Weight in (lb) to have BMI = 25: 154.6  Neurologic Exam   GEN: WDWN, NAD, Non-toxic, A & O x 3 HEENT: Atraumatic, Normocephalic. Neck supple. No masses, No LAD. Ears and Nose: No external deformity. CV: RRR, No M/G/R. No JVD. No thrill. No extra heart sounds. PULM: CTA B, no wheezes, crackles, rhonchi. No retractions. No resp. distress. No accessory muscle use. ABD: S, NT, ND, +BS. No rebound tenderness. No HSM.  EXTR: No c/c/e  Neuro: CN 2-12 grossly intact. PERRLA. EOMI. Sensation intact throughout. Str 5/5 all extremities. DTR 2+. No clonus. A and o x 4. Romberg neg. Finger nose neg. Heel -shin neg.   PSYCH: Normally interactive. Conversant. Not depressed or anxious appearing.  Calm demeanor.    IMAGING DATA  EXAM INFO: [id number] [facility]  11/16/12  01:14:57 [id number] [facility]) : MRI HEAD W WO CONTRAST MRA HEAD WO  CONTRAST  EXAM: Magnetic resonance imaging, brain without contrast material, followed by contrast material and further sequences. Magnetic resonance angiography, head, without contrast material, followed by contrast material and further sequences. MR venography  with 2-D time-of-flight venogram.  DICTATED: 11/16/12 01:31:51  CLINICAL INDICATION: 72 year old M.   - , HEADACHE NEW ONSET OR NEW SYMPTOMS.  COMPARISON: Head CT 11/15/12  TECHNIQUE: Multiplanar, multisequence MR imaging of the brain was performed without and with I.V. contrast.  Time of flight circle of Willis MRA was also performed. MR venography with 2-D  time-of-flight venogram.  FINDINGS: Many of the sequences are limited by patient motion. No focal parenchymal signal abnormality is identified.  There is no midline shift or mass lesion. There is no evidence of intracranial hemorrhage or infarct.  There are no extra-axial fluid  collections present.  No diffusion weighted signal abnormality is identified.  There is no abnormal enhancement following contrast administration.    Circle of Willis MRA does not demonstrate any aneurysms or high grade stenoses. MRV imaging shows no evidence of venous sinus thrombosis.  INTERPRETATION LOCATION:  [facility]  IMPRESSION: Multiple sequences degraded by patient motion. No acute intracranial process identified.  Phineas Semen, MD     11/15/2012 11:53 PMED Procedure NoteLumbar PunctureDate/Time: 11/15/2012 9:13 PMPerformed by: Jeannie Fend by: Reed Breech AConsent: Verbal consent obtained. written consent obtained.Risks and benefits: risks, benefits and alternatives were discussedConsent given by: patientPatient understanding: patient states understanding of the procedure being performedPatient consent: the patient's understanding of the procedure matches consent givenProcedure consent: procedure consent matches procedure scheduledRelevant documents: relevant documents present  and verifiedSite marked: the operative site was markedPatient identity confirmed: verbally with patient, arm band and hospital-assigned identification numberIndications: evaluation for subarachnoid hemorrhageAnesthesia: local infiltrationLocal anesthetic: lidocaine 1% without epinephrineAnesthetic total: 5 mlPatient sedated: noPreparation: Patient was prepped and draped in the usual sterile fashion.Lumbar space: L4-L5 interspaceNumber of attempts: 1Fluid appearance: clearTubes of fluid: 4Post-procedure: site cleanedPatient tolerance: Patient tolerated the procedure well with no immediate complications.  Francia Greaves INFO: [id number] [facility]  11/15/12  19:01:12 [id number] [facility]) : CT HEAD WO CONTRASTEXAM: Computed tomography, head or brain without contrast material.DICTATED: 11/15/12 19:39:32Interpretation location: Main campus.CLINICAL INDICATION: 72 year old M.   - , headache.COMPARISON: None available.TECHNIQUE: Axial 4.8-mm images from skull base to vertex without contrast. For all [facility] CT exams, radiation dose reduction device (automated exposure control) is used or manual techniques with radiation dose As Low As Reasonably Achievable (ALARA) protocol are followed using age and patient-size-specific scan parameters, while maintaining the necessary diagnostic image quality.FINDINGS: There is no midline shift or mass lesion.  There is no evidence of acute intracranial hemorrhage or infarct.  No fractures are evident.  The visualized paranasal sinuses and mastoid air cells are pneumatized and free of fluid. The orbits are unremarkable.IMPRESSION: No acute intracranial hemorrhage or infarct.  11/16/2012 [facility] LAB DATA  Sedimentation rate, manual - Final result (11/16/2012 2:20 AM EDT) Component Value Range  Sed Rate 4 0-20 mm/h   CSF, tube 4, cell count with diff - Final result (11/15/2012 9:43 PM EDT) Component Value Range  Tube # CSF 1    Color, CSF COLORLESS    Appearance, CSF CLEAR    Nucleated Cells, CSF 1Comment:   <1 MONTH: 0-32 59MO-85YR:  0-10 1-4 YRS:  0-8 5 YRS - ADULT: 0-5  /mm3  RBC, CSF 1 /mm3  Lymphs, CSF 69 %  Mono/Macrophage, CSF 31 %  Number of Cells, CSF 13     CSF, tube 2, Protein - Final result (11/15/2012 9:43 PM EDT) Component Value Range  Protein, CSF 32 15-45 mg/dL   CSF, tube 2, Glucose - Final result (11/15/2012 9:43 PM EDT) Component Value Range  Glucose, CSF 58 50-75 mg/dL   CSF, tube 1, cell count with diff - Edited Result - FINAL (11/15/2012 9:43 PM EDT) Component Value Range  Tube # CSF 4Comment: **EDITED REPORT - Previously reported as 1 at 22:41 on 11/15/12.    Color, CSF COLORLESS    Appearance, CSF CLEAR    Nucleated Cells, CSF <1Comment:  <1  MONTH: 0-32 25MO-43YR:  0-10 1-4 YRS:  0-8 5 YRS - ADULT: 0-5 **EDITED REPORT - Previously reported as 1 at 22:41 on 11/15/12.  /mm3  RBC, CSF <1Comment: **EDITED REPORT - Previously reported as 1 at 22:41 on 11/15/12. /mm3  Lymphs, CSF 50 %  Mono/Macrophage, CSF 50 %  Number of Cells, CSF 20     CSF, tube 3, Culture - Final result (11/15/2012 9:30 PM EDT) Component Value Range  CSF Culture NO GROWTH AT 7 DAYS. :    Gram Stain Result 1+ POLYMORPHONUCLEAR LEUKOCYTES NO ORGANISMS SEEN     Specimen  Cerebrospinal Fluid - Lumbar Puncture    EGFR(MDRD) Non-African American - Final result (11/15/2012 7:45 PM EDT) Component Value Range  EGFR Non-African American >=60 >=60 mL/min/1.73_m2   EGFR(MDRD) African American - Final result (11/15/2012 7:45 PM EDT) Component Value Range  EGFR African American >=60 >=60 mL/min/1.73_m2  Bun/Creatinine Ratio - Final result (11/15/2012 7:45 PM EDT) Component Value Range  BUN/Creatinine Ratio 25 UNDEFINED   Anion Gap - Final result (11/15/2012 7:45 PM EDT) Component Value Range  Anion Gap 11 9-15 mmol/L   HEP CORRELATION - Final result (11/15/2012 7:45 PM EDT) Component Value Range  Heparin Correlation <0.2Comment:  Note: This result is for use only with patients  receiving heparin therapy in conjunction with the Heparin Nomogram.  In patients not receiving heparin, an elevated aPTT does not always imply anticoagulation.     Basic metabolic panel - Final result (11/15/2012 7:45 PM EDT) Component Value Range  Sodium 141 135-145 mmol/L  Potassium 4.9 3.5-5.0 mmol/L  Chloride 108 (H) 98-107 mmol/L  CO2 22 22-30 mmol/L  BUN 22 (H) 7-21 mg/dL  Creatinine 2.95 6.21-3.08 mg/dL  Glucose 657 84-696 mg/dL  Calcium 9.3 2.9-52.8 mg/dL   CBC and differential - Final result (11/15/2012 7:45 PM EDT) Component Value Range  WBC 8.4 4.5-11.0 10*9/L  RBC 4.91 4.50-5.90 10*12/L  Hemoglobin 14.6 13.5-17.5 g/dL  Hematocrit 41.3 24.4-01.0 %  MCV 91 80-100 fL  MCH 30 26-34 pg  MCHC 33 31-37 g/dL  RDW 27.2 53.6-64.4 %  MPV 11.5 (H) 7.0-10.0 fL  Platelet Count 131 (L) 150-440 10*9/L  Neutrophils Absolute 5.7 2.0-7.5 10*9/L  Lymphocytes Absolute 1.9 1.5-5.0 10*9/L  Monocytes Absolute 0.5 0.2-0.8 10*9/L  Eosinophils Absolute 0.1 0.0-0.4 10*9/L  Basophils Absolute 0.0 0.0-0.1 10*9/L  Large Unstained Cells 3 0-4 %   Protime-INR - Final result (11/15/2012 7:45 PM EDT) Component Value Range  Prothrombin Time 10.6 9.7-12.1 s  INR 1.0     APTT - Final result (11/15/2012 7:45 PM EDT) Component Value Range  APTT 30.6 26.2-36.9 s   Assessment and Plan:  Headache(784.0) - Plan: Doppler carotid, 2D Echocardiogram without contrast  Need for prophylactic vaccination and inoculation against influenza - Plan: Flu Vaccine QUAD 36+ mos PF IM (Fluarix)  Blurred vision - Plan: Doppler carotid, 2D Echocardiogram without contrast  CORONARY ARTERY DISEASE  HYPERLIPIDEMIA  HYPERTENSION  >40 minutes spent in face to face time with patient, >50% spent in counselling or coordination of care: The entirety of [facility]'s records including ER notes, labs, imaging reviewed. In this patient with CAD, multiple risk factors, I think you need to worry that this is a TIA. No inducible  vertigo and he does not give a history of vertigo to me. Check carotid u/s and echo. Cont ASA.  Orders Today:  Orders Placed This Encounter  Procedures  . Flu Vaccine QUAD 36+ mos PF IM (Fluarix)  . 2D Echocardiogram without contrast  Standing Status: Future     Number of Occurrences:      Standing Expiration Date: 12/16/2013    Order Specific Question:  Type of Echo    Answer:  Complete    Order Specific Question:  Reason for Exam    Answer:  headache, blurred vision    Order Specific Question:  Where should this test be performed    Answer:  [facility]  . Doppler carotid    Standing Status: Future     Number of Occurrences:      Standing Expiration Date: 12/16/2013    Order Specific Question:  Laterality    Answer:  Bilateral    Order Specific Question:  Where should this test be performed:    Answer:  [facility] [place]    Updated Medication List: (Includes new medications, updates to list, dose adjustments) No orders of the defined types were placed in this encounter.    Medications Discontinued: There are no discontinued medications.    Signed, Elpidio Galea. Naviah Belfield, MD 12/16/2012 2:19 PM

## 2012-12-17 ENCOUNTER — Encounter: Payer: Self-pay | Admitting: Family Medicine

## 2013-01-02 ENCOUNTER — Other Ambulatory Visit: Payer: Self-pay | Admitting: Family Medicine

## 2013-01-03 ENCOUNTER — Other Ambulatory Visit (INDEPENDENT_AMBULATORY_CARE_PROVIDER_SITE_OTHER): Payer: Medicare Other

## 2013-01-03 DIAGNOSIS — R51 Headache: Secondary | ICD-10-CM

## 2013-01-03 DIAGNOSIS — H538 Other visual disturbances: Secondary | ICD-10-CM

## 2013-01-03 DIAGNOSIS — I2510 Atherosclerotic heart disease of native coronary artery without angina pectoris: Secondary | ICD-10-CM

## 2013-01-03 DIAGNOSIS — G459 Transient cerebral ischemic attack, unspecified: Secondary | ICD-10-CM

## 2013-01-12 ENCOUNTER — Other Ambulatory Visit: Payer: Self-pay | Admitting: Family Medicine

## 2013-01-17 ENCOUNTER — Ambulatory Visit (INDEPENDENT_AMBULATORY_CARE_PROVIDER_SITE_OTHER): Payer: Medicare Other | Admitting: Family Medicine

## 2013-01-17 ENCOUNTER — Encounter: Payer: Self-pay | Admitting: Family Medicine

## 2013-01-17 VITALS — BP 106/70 | HR 60 | Temp 97.8°F | Wt 256.8 lb

## 2013-01-17 DIAGNOSIS — R05 Cough: Secondary | ICD-10-CM

## 2013-01-17 DIAGNOSIS — J019 Acute sinusitis, unspecified: Secondary | ICD-10-CM

## 2013-01-17 MED ORDER — AMOXICILLIN-POT CLAVULANATE 875-125 MG PO TABS: 1.0000 | ORAL_TABLET | Freq: Two times a day (BID) | ORAL | Status: AC

## 2013-01-17 NOTE — Progress Notes (Signed)
duration of symptoms: 2 weeks.  rhinorrhea:yes congestion:yes ear pain:no sore throat: yes Cough: yes, discolored sputum Myalgias: chest wall sore other concerns:sore from coughing.   No fevers.   ROS: See HPI.  Otherwise negative.    Meds, vitals, and allergies reviewed.   GEN: nad, alert and oriented HEENT: mucous membranes moist, TM w/o erythema, nasal epithelium injected, OP with cobblestoning, sinuses not ttp NECK: supple w/o LA CV: rrr. PULM: coarse BS but ctab, no inc wob ABD: soft, +bs EXT: no edema

## 2013-01-17 NOTE — Patient Instructions (Signed)
Start the antibiotics today and this should get better.  Take care.

## 2013-01-18 DIAGNOSIS — R05 Cough: Secondary | ICD-10-CM | POA: Insufficient documentation

## 2013-01-18 NOTE — Assessment & Plan Note (Signed)
Given the cough, sputum,duration and coarse BS, would start abx.  Nontoxic.  Supportive care o/w. F/u prn.  He agrees. D/w pt.

## 2013-01-22 ENCOUNTER — Encounter (INDEPENDENT_AMBULATORY_CARE_PROVIDER_SITE_OTHER): Payer: Medicare Other

## 2013-01-22 DIAGNOSIS — H538 Other visual disturbances: Secondary | ICD-10-CM

## 2013-01-22 DIAGNOSIS — H53129 Transient visual loss, unspecified eye: Secondary | ICD-10-CM

## 2013-01-22 DIAGNOSIS — R51 Headache: Secondary | ICD-10-CM

## 2013-01-22 DIAGNOSIS — I6529 Occlusion and stenosis of unspecified carotid artery: Secondary | ICD-10-CM

## 2013-02-02 ENCOUNTER — Other Ambulatory Visit: Payer: Self-pay | Admitting: Family Medicine

## 2013-02-03 NOTE — Telephone Encounter (Signed)
i typically do not refill antibiotics over the phone. I am going to forward to Dr. Para March who just evaluated the patient who I have only met once. Since he evaluated him recently, I would rather defer to him.

## 2013-02-03 NOTE — Telephone Encounter (Signed)
Electronic refill request.  Please advise. 

## 2013-02-03 NOTE — Telephone Encounter (Signed)
Deny this, if in need of refill then he needs to be reevaluated first.  Thanks .

## 2013-03-01 ENCOUNTER — Other Ambulatory Visit: Payer: Self-pay | Admitting: Family Medicine

## 2013-04-02 ENCOUNTER — Encounter: Payer: Self-pay | Admitting: Family Medicine

## 2013-04-02 ENCOUNTER — Ambulatory Visit (INDEPENDENT_AMBULATORY_CARE_PROVIDER_SITE_OTHER): Payer: Medicare Other | Admitting: Family Medicine

## 2013-04-02 VITALS — BP 134/82 | HR 70 | Temp 97.9°F | Wt 255.0 lb

## 2013-04-02 DIAGNOSIS — J189 Pneumonia, unspecified organism: Secondary | ICD-10-CM

## 2013-04-02 MED ORDER — AMOXICILLIN 500 MG PO CAPS: 1000.0000 mg | ORAL_CAPSULE | Freq: Two times a day (BID) | ORAL | Status: DC

## 2013-04-02 NOTE — Progress Notes (Signed)
Patient Name: Hunter Young Date of Birth: 1940-07-02 Medical Record Number: [id number]  History of Present Illness:  Patent presents with runny nose, sneezing, cough, sore throat, malaise and minimal / low-grade fever .  Started in chest late Thursday. Wife took some amoxicillin, and he started to feel a lot better.  Robitussin and it felt a little better.   + recent exposure to others with similar symptoms.   The patent denies sore throat as the primary complaint. Denies sthortness of breath/wheezing, high fever, chest pain, rhinits for more than 14 days, significant myalgia, otalgia, facial pain, abdominal pain, changes in bowel or bladder.  PMH, PHS, Allergies, Problem List, Medications, Family History, and Social History have all been reviewed.  Patient Active Problem List   Diagnosis Date Noted  . Severe obesity (BMI >= 40) 04/02/2013  . CERVICAL DISORDER, NOS 10/14/2009  . GERD 12/06/2007  . BENIGN PROSTATIC HYPERTROPHY, WITH URINARY OBSTRUCTION 06/25/2007  . OSTEOARTHRITIS 06/25/2007  . CARDIOMEGALY 05/30/2007  . ALLERGIC RHINITIS 04/22/2007  . VENOUS INSUFFICIENCY 12/04/2006  . HYPERLIPIDEMIA 12/03/2006  . HYPERTENSION 12/03/2006  . CORONARY ARTERY DISEASE 12/03/2006  . DIVERTICULOSIS OF COLON 05/20/2001    Past Medical History  Diagnosis Date  . ALLERGIC RHINITIS 04/22/2007  . ASTHMA, INTRINSIC NOS 12/27/2006  . BENIGN PROSTATIC HYPERTROPHY, WITH URINARY OBSTRUCTION 06/25/2007  . Cardiomegaly 05/30/2007  . CERVICAL DISORDER, NOS 10/14/2009  . CORONARY ARTERY DISEASE 12/03/2006  . DIVERTICULOSIS OF COLON 05/20/2001  . Dyshidrosis 06/25/2007  . GERD 12/06/2007  . HYPERLIPIDEMIA 12/03/2006  . HYPERTENSION 12/03/2006  . INTERNAL HEMORRHOIDS 05/20/2001  . OBESITY, MODERATE 12/05/2008  . OSTEOARTHRITIS 06/25/2007  . SNORING 11/06/2007  . VENOUS INSUFFICIENCY 12/04/2006    Past Surgical History  Procedure Laterality Date  . Foot surgery    . Tonsillectomy    . Joint  replacement      bilat knee  . Angioplasty      2 stents    History   Social History  . Marital Status: Married    Spouse Name: N/A    Number of Children: N/A  . Years of Education: N/A   Occupational History  . Not on file.   Social History Main Topics  . Smoking status: Former Smoker    Quit date: 08/21/1980  . Smokeless tobacco: Never Used  . Alcohol Use: 10.5 oz/week    21 drink(s) per week     Comment: occ  . Drug Use: No  . Sexual Activity: Not on file   Other Topics Concern  . 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       Works part time for [employer].    Runs hub to [place], [place]. 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    In trucking business for a long time.    Family History  Problem Relation Age of Onset  . Colon cancer Mother 43    Allergies  Allergen Reactions  . Oxycodone-Aspirin Nausea And Vomiting    Medication list reviewed and updated in full in [place].  Review of Systems: as above, eating and drinking - tolerating PO. Urinating normally. No excessive vomitting or diarrhea. O/w as above.  Physical Exam:  Filed Vitals:   04/02/13 0805  BP: 134/82  Pulse: 70  Temp: 97.9 F (36.6 C)  TempSrc: Oral  Weight: 255 lb (115.667 kg)  SpO2: 96%    GEN: WDWN, Non-toxic, Atraumatic, normocephalic. A  and O x 3. HEENT: Oropharynx clear without exudate, MMM, no significant LAD, mild rhinnorhea Ears: TM clear, COL visualized with good landmarks CV: RRR, no m/g/r. Pulm: CTA B, no wheezes, + rhonchi diffusely, and no crackles, normal respiratory effort. EXT: no c/c/e Psych: well oriented, neither depressed nor anxious in appearance  Objective Data: Results for orders placed in visit on 03/06/12  LIPID PANEL      Result Value Range   Cholesterol 161  0 - 200 mg/dL   Triglycerides 44.0  0.0 - 149.0 mg/dL   HDL 57.30  >39.00 mg/dL   VLDL 8.8  0.0 - 40.0 mg/dL   LDL Cholesterol 95  0 - 99  mg/dL   Total CHOL/HDL Ratio 3    HEPATIC FUNCTION PANEL      Result Value Range   Total Bilirubin 1.0  0.3 - 1.2 mg/dL   Bilirubin, Direct 0.1  0.0 - 0.3 mg/dL   Alkaline Phosphatase 62  39 - 117 U/L   AST 25  0 - 37 U/L   ALT 23  0 - 53 U/L   Total Protein 7.2  6.0 - 8.3 g/dL   Albumin 3.9  3.5 - 5.2 g/dL    A/P: 1. CAP. Supportive care reviewed with patient. See patient instruction section. ABX and supportive care. Clinically improved with left over amox at home.  Meds ordered this encounter  Medications  . amoxicillin (AMOXIL) 500 MG capsule    Sig: Take 2 capsules (1,000 mg total) by mouth 2 (two) times daily.    Dispense:  40 capsule    Refill:  0    Signed,  Lilith Solana T. Victorious Cosio, MD, [place] [facility] [place] [address] Phone: [phone] Fax: [phone]

## 2013-04-02 NOTE — Progress Notes (Signed)
Pre-visit discussion using our clinic review tool. No additional management support is needed unless otherwise documented below in the visit note.  

## 2013-08-29 ENCOUNTER — Other Ambulatory Visit: Payer: Self-pay | Admitting: Family Medicine

## 2013-09-12 ENCOUNTER — Ambulatory Visit (INDEPENDENT_AMBULATORY_CARE_PROVIDER_SITE_OTHER): Payer: Medicare Other | Admitting: Cardiovascular Disease

## 2013-09-12 ENCOUNTER — Encounter: Payer: Self-pay | Admitting: Cardiovascular Disease

## 2013-09-12 VITALS — BP 132/82 | HR 76 | Ht 68.5 in | Wt 254.8 lb

## 2013-09-12 DIAGNOSIS — E785 Hyperlipidemia, unspecified: Secondary | ICD-10-CM

## 2013-09-12 DIAGNOSIS — I10 Essential (primary) hypertension: Secondary | ICD-10-CM

## 2013-09-12 DIAGNOSIS — I2510 Atherosclerotic heart disease of native coronary artery without angina pectoris: Secondary | ICD-10-CM

## 2013-09-12 NOTE — Assessment & Plan Note (Signed)
Blood pressure is well controlled on today's visit. No changes made to the medications. 

## 2013-09-12 NOTE — Patient Instructions (Signed)
You are doing well. No medication changes were made.  Please hold the atorvastatin/lipitor for 3 to 4 weeks If leg pain gets better,  Call the office  Please call us if you have new issues that need to be addressed before your next appt.  Your physician wants you to follow-up in: 12 months.  You will receive a reminder letter in the mail two months in advance. If you don't receive a letter, please call our office to schedule the follow-up appointment.

## 2013-09-12 NOTE — Assessment & Plan Note (Signed)
Currently with no symptoms of angina. No further workup at this time. Continue current medication regimen. 

## 2013-09-12 NOTE — Progress Notes (Signed)
   Patient ID: Hunter Young, male    DOB: 1941-02-28, 73 y.o.   MRN: [id number]  HPI Comments: Mr. Stairs is a very pleasant 73 year old gentleman with a history of stenting to his left circumflex, last catheterization in 2007 showing 40% proximal LAD disease, 70-80% mid RCA disease, nondominant vessel. He presents for routine followup  On previous visits, he has had rare episodes of chest pain. Again on today's visit, no significant chest pain symptoms. Has rarely has to take nitroglycerin. Metoprolol was stopped in the past secondary to bradycardia.. He has had some chest tightness at times when he has significant stress. 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 Otherwise no significant chest pain with exertion He has had significant problems with his feet, leg cramping. Seeing a specialist in [place]  EKG shows normal sinus rhythm with rate 76 beats per minute with short runs of sinus arrhythmia nonspecific T wave abnormality    Outpatient Encounter Prescriptions as of 09/12/2013  Medication Sig  . aspirin 81 MG tablet Take 81 mg by mouth daily.    Marland Kitchen atorvastatin (LIPITOR) 80 MG tablet Take 1 tablet (80 mg total) by mouth daily.  Marland Kitchen lisinopril (PRINIVIL,ZESTRIL) 40 MG tablet TAKE 1 TABLET BY MOUTH EVERY DAY  . Multiple Vitamin (MULTIVITAMIN) tablet Take 1 tablet by mouth daily.    . nitroGLYCERIN (NITROSTAT) 0.4 MG SL tablet Place 1 tablet (0.4 mg total) under the tongue every 5 (five) minutes as needed.  . Omega-3 Fatty Acids (FISH OIL) 1000 MG CAPS Take by mouth daily.    Marland Kitchen oxybutynin (DITROPAN) 5 MG tablet TAKE 1 TABLET BY MOUTH TWICE DAILY   Review of Systems  Constitutional: Negative.   HENT: Negative.   Eyes: Negative.   Respiratory: Positive for chest tightness.   Cardiovascular: Negative.   Gastrointestinal: Negative.   Endocrine: Negative.   Musculoskeletal: Negative.   Skin: Negative.   Allergic/Immunologic: Negative.   Neurological: Negative.   Hematological: Negative.    Psychiatric/Behavioral: Negative.   All other systems reviewed and are negative.   BP 132/82  Pulse 76  Ht 5' 8.5" (1.74 m)  Wt 254 lb 12 oz (115.554 kg)  BMI 38.17 kg/m2  Physical Exam  Nursing note and vitals reviewed. Constitutional: He is oriented to person, place, and time. He appears well-developed and well-nourished.  HENT:  Head: Normocephalic.  Nose: Nose normal.  Mouth/Throat: Oropharynx is clear and moist.  Eyes: Conjunctivae are normal. Pupils are equal, round, and reactive to light.  Neck: Normal range of motion. Neck supple. No JVD present.  Cardiovascular: Normal rate, regular rhythm, S1 normal, S2 normal, normal heart sounds and intact distal pulses.  Exam reveals no gallop and no friction rub.   No murmur heard. Pulmonary/Chest: Effort normal and breath sounds normal. No respiratory distress. He has no wheezes. He has no rales. He exhibits no tenderness.  Abdominal: Soft. Bowel sounds are normal. He exhibits no distension. There is no tenderness.  Musculoskeletal: Normal range of motion. He exhibits no edema and no tenderness.  Lymphadenopathy:    He has no cervical adenopathy.  Neurological: He is alert and oriented to person, place, and time. Coordination normal.  Skin: Skin is warm and dry. No rash noted. No erythema.  Psychiatric: He has a normal mood and affect. His behavior is normal. Judgment and thought content normal.      Assessment and Plan

## 2013-09-12 NOTE — Assessment & Plan Note (Signed)
He has significant cramping in his legs. We have suggested he hold the Lipitor for several weeks. If symptoms improve, recommended he call our office. Restart the Lipitor if no improvement

## 2013-11-18 ENCOUNTER — Encounter: Payer: Self-pay | Admitting: Gastroenterology

## 2013-12-27 ENCOUNTER — Other Ambulatory Visit: Payer: Self-pay | Admitting: Family Medicine

## 2013-12-30 ENCOUNTER — Telehealth: Payer: Self-pay | Admitting: Family Medicine

## 2013-12-30 NOTE — Telephone Encounter (Signed)
Left message asking pt to call office please schedule below appointments  Please call and schedule physical with fasting labs prior with Dr. Lorelei Pont.   Thanks,  Butch Penny

## 2013-12-31 NOTE — Telephone Encounter (Signed)
Labs 11/1  cpx 11/4 Pt aware of appointment date and time

## 2013-12-31 NOTE — Telephone Encounter (Signed)
Left message asking pt to call office  °

## 2014-01-09 ENCOUNTER — Other Ambulatory Visit: Payer: Self-pay

## 2014-01-20 ENCOUNTER — Other Ambulatory Visit: Payer: Self-pay | Admitting: Family Medicine

## 2014-01-20 NOTE — Telephone Encounter (Signed)
Last office visit 04/02/2013.  Not on current medication list.  Ok to refill?

## 2014-01-26 ENCOUNTER — Other Ambulatory Visit (INDEPENDENT_AMBULATORY_CARE_PROVIDER_SITE_OTHER): Payer: Medicare Other

## 2014-01-26 DIAGNOSIS — E785 Hyperlipidemia, unspecified: Secondary | ICD-10-CM

## 2014-01-26 DIAGNOSIS — Z125 Encounter for screening for malignant neoplasm of prostate: Secondary | ICD-10-CM

## 2014-01-26 DIAGNOSIS — Z79899 Other long term (current) drug therapy: Secondary | ICD-10-CM

## 2014-01-26 LAB — CBC WITH DIFFERENTIAL/PLATELET
BASOS PCT: 0.3 % (ref 0.0–3.0)
Basophils Absolute: 0 10*3/uL (ref 0.0–0.1)
EOS ABS: 0.2 10*3/uL (ref 0.0–0.7)
EOS PCT: 2.6 % (ref 0.0–5.0)
HCT: 45.9 % (ref 39.0–52.0)
Hemoglobin: 14.6 g/dL (ref 13.0–17.0)
LYMPHS PCT: 34 % (ref 12.0–46.0)
Lymphs Abs: 2.7 10*3/uL (ref 0.7–4.0)
MCHC: 31.9 g/dL (ref 30.0–36.0)
MCV: 89.8 fl (ref 78.0–100.0)
MONOS PCT: 12.1 % — AB (ref 3.0–12.0)
Monocytes Absolute: 1 10*3/uL (ref 0.1–1.0)
NEUTROS PCT: 51 % (ref 43.0–77.0)
Neutro Abs: 4 10*3/uL (ref 1.4–7.7)
Platelets: 151 10*3/uL (ref 150.0–400.0)
RBC: 5.11 Mil/uL (ref 4.22–5.81)
RDW: 14.5 % (ref 11.5–15.5)
WBC: 7.9 10*3/uL (ref 4.0–10.5)

## 2014-01-26 LAB — LIPID PANEL
CHOL/HDL RATIO: 4
Cholesterol: 239 mg/dL — ABNORMAL HIGH (ref 0–200)
HDL: 53.6 mg/dL (ref >39.00)
LDL CALC: 171 mg/dL — AB (ref 0–99)
NonHDL: 185.4
TRIGLYCERIDES: 74 mg/dL (ref 0.0–149.0)
VLDL: 14.8 mg/dL (ref 0.0–40.0)

## 2014-01-26 LAB — BASIC METABOLIC PANEL
BUN: 18 mg/dL (ref 6–23)
CO2: 26 mEq/L (ref 19–32)
Calcium: 9.4 mg/dL (ref 8.4–10.5)
Chloride: 105 mEq/L (ref 96–112)
Creatinine, Ser: 0.9 mg/dL (ref 0.4–1.5)
GFR: 87.31 mL/min (ref >60.00)
GLUCOSE: 102 mg/dL — AB (ref 70–99)
POTASSIUM: 4.7 meq/L (ref 3.5–5.1)
Sodium: 138 mEq/L (ref 135–145)

## 2014-01-26 LAB — HEPATIC FUNCTION PANEL
ALBUMIN: 3.1 g/dL — AB (ref 3.5–5.2)
ALT: 31 U/L (ref 0–53)
AST: 26 U/L (ref 0–37)
Alkaline Phosphatase: 55 U/L (ref 39–117)
Bilirubin, Direct: 0.1 mg/dL (ref 0.0–0.3)
TOTAL PROTEIN: 6.4 g/dL (ref 6.0–8.3)
Total Bilirubin: 0.7 mg/dL (ref 0.2–1.2)

## 2014-01-26 LAB — PSA: PSA: 0.37 ng/mL (ref 0.10–4.00)

## 2014-01-28 ENCOUNTER — Encounter: Payer: Self-pay | Admitting: Family Medicine

## 2014-01-28 ENCOUNTER — Ambulatory Visit (INDEPENDENT_AMBULATORY_CARE_PROVIDER_SITE_OTHER): Payer: Medicare Other | Admitting: Family Medicine

## 2014-01-28 VITALS — BP 134/72 | HR 62 | Temp 97.8°F | Ht 66.75 in | Wt 253.0 lb

## 2014-01-28 DIAGNOSIS — E785 Hyperlipidemia, unspecified: Secondary | ICD-10-CM

## 2014-01-28 DIAGNOSIS — I6521 Occlusion and stenosis of right carotid artery: Secondary | ICD-10-CM

## 2014-01-28 DIAGNOSIS — I517 Cardiomegaly: Secondary | ICD-10-CM

## 2014-01-28 DIAGNOSIS — I2510 Atherosclerotic heart disease of native coronary artery without angina pectoris: Secondary | ICD-10-CM

## 2014-01-28 DIAGNOSIS — J301 Allergic rhinitis due to pollen: Secondary | ICD-10-CM

## 2014-01-28 DIAGNOSIS — I10 Essential (primary) hypertension: Secondary | ICD-10-CM

## 2014-01-28 DIAGNOSIS — Z0000 Encounter for general adult medical examination without abnormal findings: Secondary | ICD-10-CM

## 2014-01-28 DIAGNOSIS — N138 Other obstructive and reflux uropathy: Secondary | ICD-10-CM

## 2014-01-28 DIAGNOSIS — Z23 Encounter for immunization: Secondary | ICD-10-CM

## 2014-01-28 DIAGNOSIS — M722 Plantar fascial fibromatosis: Secondary | ICD-10-CM

## 2014-01-28 DIAGNOSIS — N401 Enlarged prostate with lower urinary tract symptoms: Secondary | ICD-10-CM

## 2014-01-28 DIAGNOSIS — K219 Gastro-esophageal reflux disease without esophagitis: Secondary | ICD-10-CM

## 2014-01-28 MED ORDER — OXYBUTYNIN CHLORIDE 5 MG PO TABS: 5.0000 mg | ORAL_TABLET | Freq: Three times a day (TID) | ORAL | Status: DC

## 2014-01-28 MED ORDER — ROSUVASTATIN CALCIUM 10 MG PO TABS: 10.0000 mg | ORAL_TABLET | Freq: Every day | ORAL | Status: DC

## 2014-01-28 NOTE — Progress Notes (Signed)
Dr. Frederico Hamman T. Copland, MD, [facility] [place], [address] Phone: [phone] Fax: [phone]  01/28/2014  Patient: Hunter Young, MRN: [id number], DOB: 1941/03/01, 73 y.o.  Primary Physician:  Owens Loffler, MD  Chief Complaint: Annual Exam CC: and f/u other ongoing issues, such as Cholesterol and HTN  Subjective:   Hunter Young is a 73 y.o. pleasant patient who presents for a medicare wellness examination:  Preventative Health Maintenance Visit:  Health Maintenance Summary Reviewed and updated, unless pt declines services.  Tobacco History Reviewed. Alcohol: No concerns, no excessive use Exercise Habits: Some activity, rec at least 30 mins 5 times a week STD concerns: no risk or activity to increase risk Drug Use: None Encouraged self-testicular check  Health Maintenance  Topic Date Due  . ZOSTAVAX  07/15/1998  . INFLUENZA VACCINE  10/26/2014  . COLONOSCOPY  08/26/2017  . TETANUS/TDAP  07/23/2018  . PNEUMOCOCCAL POLYSACCHARIDE VACCINE AGE 65 AND OVER  Completed    Immunization History  Administered Date(s) Administered  . Hepatitis B 07/22/2008  . Influenza Split 01/19/2011, 12/21/2011  . Influenza Whole 01/10/2007, 12/25/2008  . Influenza,inj,Quad PF,36+ Mos 12/16/2012, 01/28/2014  . Pneumococcal Conjugate-13 01/28/2014  . Pneumococcal Polysaccharide-23 03/27/2004, 01/19/2011  . Td 03/27/1998, 07/22/2008   The patient presents with a 1 year long history of heel pain. This is notable for worsening pain first thing in the morning when arising and standing after sitting.  Metatarsal pain: no  Met with Yolanda Bonine in [place], [place] and MRI are normal. 2 cortisone shots, with PF.  Prior foot or ankle fractures: none Orthotics or bracing: none Medications: NSAIDS, tylenol PT or home rehab: Both Night splints: no Ice massage: no Ball massage: no  Lipids: He stopped his lipitor due to muscle aches,  which went away after he stopped it. Panel reviewed with patient.  Lipids:    Component Value Date/Time   CHOL 239* 01/26/2014 0919   TRIG 74.0 01/26/2014 0919   HDL 53.60 01/26/2014 0919   VLDL 14.8 01/26/2014 0919   CHOLHDL 4 01/26/2014 0919    Lab Results  Component Value Date   ALT 31 01/26/2014   AST 26 01/26/2014   ALKPHOS 55 01/26/2014   BILITOT 0.7 01/26/2014     High cholesterol: crestor 10 mg  HTN: Tolerating all medications without side effects Stable and at goal No CP, no sob. No HA.  BP Readings from Last 3 Encounters:  01/28/14 134/72  09/12/13 132/82  04/02/13 992/42    Basic Metabolic Panel:    Component Value Date/Time   NA 138 01/26/2014 0919   K 4.7 01/26/2014 0919   CL 105 01/26/2014 0919   CO2 26 01/26/2014 0919   BUN 18 01/26/2014 0919   CREATININE 0.9 01/26/2014 0919   GLUCOSE 102* 01/26/2014 0919   CALCIUM 9.4 01/26/2014 0919       Patient Active Problem List   Diagnosis Date Noted  . Stenosis of right carotid artery 01/29/2014  . Severe obesity (BMI >= 40) 04/02/2013  . CERVICAL DISORDER, NOS 10/14/2009  . GERD 12/06/2007  . BPH (benign prostatic hypertrophy) with urinary obstruction 06/25/2007  . OSTEOARTHRITIS 06/25/2007  . CARDIOMEGALY 05/30/2007  . Allergic rhinitis 04/22/2007  . VENOUS INSUFFICIENCY 12/04/2006  . Hyperlipidemia LDL goal <70 12/03/2006  . Essential hypertension 12/03/2006  . Coronary artery disease involving native coronary artery without angina pectoris 12/03/2006  . DIVERTICULOSIS OF COLON 05/20/2001   Past Medical  History  Diagnosis Date  . ALLERGIC RHINITIS 04/22/2007  . ASTHMA, INTRINSIC NOS 12/27/2006  . BENIGN PROSTATIC HYPERTROPHY, WITH URINARY OBSTRUCTION 06/25/2007  . Cardiomegaly 05/30/2007  . CERVICAL DISORDER, NOS 10/14/2009  . CORONARY ARTERY DISEASE 12/03/2006  . DIVERTICULOSIS OF COLON 05/20/2001  . Dyshidrosis 06/25/2007  . GERD 12/06/2007  . HYPERLIPIDEMIA 12/03/2006  . HYPERTENSION 12/03/2006   . INTERNAL HEMORRHOIDS 05/20/2001  . OBESITY, MODERATE 12/05/2008  . OSTEOARTHRITIS 06/25/2007  . VENOUS INSUFFICIENCY 12/04/2006  . Carotid stenosis 01/29/2014   Past Surgical History  Procedure Laterality Date  . Foot surgery    . Tonsillectomy    . Joint replacement      bilat knee  . Angioplasty      2 stents   History   Social History  . Marital Status: Married    Spouse Name: N/A    Number of Children: N/A  . Years of Education: N/A   Occupational History  . Not on file.   Social History Main Topics  . Smoking status: Former Smoker    Quit date: 08/21/1980  . Smokeless tobacco: Never Used  . Alcohol Use: 12.6 oz/week    21 Not specified per week     Comment: occ  . Drug Use: No  . Sexual Activity: Not on file   Other Topics Concern  . 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       Works part time for [employer].    Runs hub to [place], [place]. 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    In trucking business for a long time.   Family History  Problem Relation Age of Onset  . Colon cancer Mother 60   Allergies  Allergen Reactions  . Oxycodone-Aspirin Nausea And Vomiting    Medication list has been reviewed and updated.   General: Denies fever, chills, sweats. No significant weight loss. Eyes: Denies blurring,significant itching ENT: Denies earache, sore throat, and hoarseness. Cardiovascular: Denies chest pains, palpitations, dyspnea on exertion Respiratory: Denies cough, dyspnea at rest,wheeezing Breast: no concerns about lumps GI: Denies nausea, vomiting, diarrhea, constipation, change in bowel habits, abdominal pain, melena, hematochezia GU: Denies penile discharge, ED, BPH SYMPTOMS - INCREASED RECENTLY. No STD concerns. Musculoskeletal: Denies back pain, joint pain - FOOT AS ABOVE Derm: Denies rash, itching Neuro: Denies  paresthesias, frequent falls, frequent headaches Psych: Denies depression,  anxiety Endocrine: Denies cold intolerance, heat intolerance, polydipsia Heme: Denies enlarged lymph nodes Allergy: No hayfever  Objective:   BP 134/72 mmHg  Pulse 62  Temp(Src) 97.8 F (36.6 C) (Oral)  Ht 5' 6.75" (1.695 m)  Wt 253 lb (114.76 kg)  BMI 39.94 kg/m2  SpO2 96%  The patient completed a fall screen and PHQ-2 and PHQ-9 if necessary, which is documented in the EHR. The CMA/LPN/RN who assisted the patient verbally completed with them and documented results in [facility] [place].   Hearing Screening   125Hz 250Hz 500Hz 1000Hz 2000Hz 4000Hz 8000Hz  Right ear:   40 40 40 0   Left ear:   40 40 40 0     Visual Acuity Screening   Right eye Left eye Both eyes  Without correction: 20/15 20/200 20/15  With correction:       GEN: well developed, well nourished, no acute distress Eyes: conjunctiva and lids normal, PERRLA, EOMI ENT: TM clear, nares clear, oral exam WNL Neck: supple, no lymphadenopathy, no thyromegaly, no JVD  Pulm: clear to auscultation and percussion, respiratory effort normal CV: regular rate and rhythm, S1-S2, no murmur, rub or gallop, no bruits, peripheral pulses normal and symmetric, no cyanosis, clubbing, edema or varicosities GI: soft, non-tender; no hepatosplenomegaly, masses; active bowel sounds all quadrants GU: DEFER Lymph: no cervical, axillary or inguinal adenopathy MSK: gait normal, muscle tone and strength WNL, no joint swelling, effusions, discoloration, crepitus. insertional pain at PF on R SKIN: clear, good turgor, color WNL, no rashes, lesions, or ulcerations Neuro: normal mental status, normal strength, sensation, and motion Psych: alert; oriented to person, place and time, normally interactive and not anxious or depressed in appearance.  All labs reviewed with patient.  Lipids:    Component Value Date/Time   CHOL 239* 01/26/2014 0919   TRIG 74.0 01/26/2014 0919   HDL 53.60 01/26/2014 0919   VLDL 14.8 01/26/2014 0919   CHOLHDL 4  01/26/2014 0919   CBC: CBC Latest Ref Rng 01/26/2014 01/11/2011 07/22/2009  WBC 4.0 - 10.5 K/uL 7.9 7.2 6.9  Hemoglobin 13.0 - 17.0 g/dL 14.6 13.9 14.3  Hematocrit 39.0 - 52.0 % 45.9 43.1 42.8  Platelets 150.0 - 400.0 K/uL 151.0 149.0(L) 026.3    Basic Metabolic Panel:    Component Value Date/Time   NA 138 01/26/2014 0919   K 4.7 01/26/2014 0919   CL 105 01/26/2014 0919   CO2 26 01/26/2014 0919   BUN 18 01/26/2014 0919   CREATININE 0.9 01/26/2014 0919   GLUCOSE 102* 01/26/2014 0919   CALCIUM 9.4 01/26/2014 0919   Hepatic Function Latest Ref Rng 01/26/2014 03/06/2012 12/21/2011  Total Protein 6.0 - 8.3 g/dL 6.4 7.2 6.9  Albumin 3.5 - 5.2 g/dL 3.1(L) 3.9 3.8  AST 0 - 37 U/L _0 ALT 0 - 53 U/L _1 Alk Phosphatase 39 - 117 U/L 55 62 60  Total Bilirubin 0.2 - 1.2 mg/dL 0.7 1.0 1.0  Bilirubin, Direct 0.0 - 0.3 mg/dL 0.1 0.1 0.1    Lab Results  Component Value Date   TSH 0.93 01/11/2011   Lab Results  Component Value Date   PSA 0.37 01/26/2014   PSA 0.35 01/11/2011   PSA 0.29 07/22/2009    Assessment and Plan:   Healthcare maintenance  Need for prophylactic vaccination and inoculation against influenza - Plan: Flu Vaccine QUAD 36+ mos PF IM (Fluarix Quad PF), CANCELED: Flu Vaccine QUAD 36+ mos PF IM (Fluarix Quad PF)  Need for vaccination with 13-polyvalent pneumococcal conjugate vaccine - Plan: Pneumococcal conjugate vaccine 13-valent  Hyperlipidemia LDL goal <70  Essential hypertension  Gastroesophageal reflux disease, esophagitis presence not specified  Stenosis of right carotid artery  Coronary artery disease involving native coronary artery of native heart without angina pectoris  BPH (benign prostatic hypertrophy) with urinary obstruction  Allergic rhinitis due to pollen  Cardiomegaly  Severe obesity (BMI >= 40)  I also tried to update the patient's diagnoses, so that they reflect proper ICD10 coding.  Health Maintenance Exam: The  patient's preventative maintenance and recommended screening tests for an annual wellness exam were reviewed in full today. Brought up to date unless services declined.  Counselled on the importance of diet, exercise, and its role in overall health and mortality. The patient's FH and SH was reviewed, including their home life, tobacco status, and drug and alcohol status.  I have personally reviewed the Medicare Annual Wellness questionnaire and have noted 1. The patient's medical and social history 2. Their use of alcohol, tobacco or illicit drugs 3.  Their current medications and supplements 4. The patient's functional ability including ADL's, fall risks, home safety risks and hearing or visual             impairment. 5. Diet and physical activities 6. Evidence for depression or mood disorders  The patients weight, height, BMI and visual acuity have been recorded in the chart I have made referrals, counseling and provided education to the patient based review of the above and I have provided the pt with a written personalized care plan for preventive services.  I have provided the patient with a copy of your personalized plan for preventive services. Instructed to take the time to review along with their updated medication list.  >15 minutes spent in face to face time with patient, >50% spent in counselling or coordination of care:  Over and above health maintenance.  The following were discussed in detail.  Hyperlipidemia: The patient discontinued his statin secondary to myalgia.  He does have a history of coronary disease with previous percutaneous interventions, so I recommended that he resume a low-dose of Crestor at 10 mg daily.  If he is able to tolerate this, then we could potentially increase the dose.  Benign prostatic hypertrophy with obstruction and overactive bladder.  Increase the patient's dose of Ditropan to 3 times daily dosing as opposed to twice a day dosing.  Plantar  fascitis: The patient had a number of questions about this.  He has gone and seen the PA in Dr. Tonette Bihari clinic at [place].  Dr. Ouida Sills is certainly one of the worlds foremost foot and ankle surgeons, and he can certainly offer advice regarding plantar fasciitis.  I reviewed with him traditional conservative care for the management of this condition and added a number of things to his home treatment protocol.  Follow-up: No Follow-up on file. Or follow-up in 1 year for complete physical examination  New Prescriptions   ROSUVASTATIN (CRESTOR) 10 MG TABLET    Take 1 tablet (10 mg total) by mouth daily.   Orders Placed This Encounter  Procedures  . Flu Vaccine QUAD 36+ mos PF IM (Fluarix Quad PF)  . Pneumococcal conjugate vaccine 13-valent    Signed,  Hunter T. Copland, MD   Patient's Medications  New Prescriptions   ROSUVASTATIN (CRESTOR) 10 MG TABLET    Take 1 tablet (10 mg total) by mouth daily.  Previous Medications   ASPIRIN 81 MG TABLET    Take 81 mg by mouth daily.     LISINOPRIL (PRINIVIL,ZESTRIL) 40 MG TABLET    TAKE 1 TABLET BY MOUTH EVERY DAY   MULTIPLE VITAMIN (MULTIVITAMIN) TABLET    Take 1 tablet by mouth daily.     NITROGLYCERIN (NITROSTAT) 0.4 MG SL TABLET    Place 1 tablet (0.4 mg total) under the tongue every 5 (five) minutes as needed.   OMEGA-3 FATTY ACIDS (FISH OIL) 1000 MG CAPS    Take by mouth daily.     OMEPRAZOLE (PRILOSEC) 20 MG CAPSULE    TAKE ONE CAPSULE BY MOUTH EVERY DAY  Modified Medications   Modified Medication Previous Medication   OXYBUTYNIN (DITROPAN) 5 MG TABLET oxybutynin (DITROPAN) 5 MG tablet      Take 1 tablet (5 mg total) by mouth 3 (three) times daily.    TAKE 1 TABLET BY MOUTH TWICE DAILY  Discontinued Medications   ATORVASTATIN (LIPITOR) 80 MG TABLET    Take 1 tablet (80 mg total) by mouth daily.

## 2014-01-28 NOTE — Progress Notes (Signed)
Pre visit review using our clinic review tool, if applicable. No additional management support is needed unless otherwise documented below in the visit note. 

## 2014-01-29 ENCOUNTER — Encounter: Payer: Self-pay | Admitting: Family Medicine

## 2014-01-29 DIAGNOSIS — I6529 Occlusion and stenosis of unspecified carotid artery: Secondary | ICD-10-CM

## 2014-01-29 DIAGNOSIS — I6521 Occlusion and stenosis of right carotid artery: Secondary | ICD-10-CM | POA: Insufficient documentation

## 2014-01-29 HISTORY — DX: Occlusion and stenosis of unspecified carotid artery: I65.29

## 2014-02-02 ENCOUNTER — Telehealth: Payer: Self-pay

## 2014-02-02 NOTE — Telephone Encounter (Signed)
Pt left v/m; Crestor cost to pt is $300.00; pt request different med sent to [facility]. Pt has been to pharmacy x 2 and Pt request cb today.

## 2014-02-02 NOTE — Telephone Encounter (Signed)
D/c crestor  Change to pravastatin 20 mg.  Electronically prescribe to the pharmacy of their choice. (May call in if pharmacy does not participate in electronic prescriptions) Call in #30, 11 refills. OR if they prefer a 90 day supply, #90 with 3 refills is OK, too Prescription instructions above

## 2014-02-03 MED ORDER — PRAVASTATIN SODIUM 20 MG PO TABS: 20.0000 mg | ORAL_TABLET | Freq: Every day | ORAL | Status: DC

## 2014-02-03 NOTE — Telephone Encounter (Signed)
Left message for Mr. Ranieri to return my call.  Pravastatin sent to [facility]

## 2014-02-03 NOTE — Telephone Encounter (Signed)
Mr. Castellana notified as instructed by telephone.

## 2014-02-23 ENCOUNTER — Telehealth: Payer: Self-pay | Admitting: Cardiovascular Disease

## 2014-02-23 NOTE — Telephone Encounter (Signed)
Pt was taken of Pravastatin for he was having leg cramps, and now his primary care doctor put him back on it. He was just making sure this was okay. Pt is coming to see [place] dec 11 @ 11am. Please call patient.

## 2014-02-23 NOTE — Telephone Encounter (Signed)
Per Dr. Lorelei Pont: "Hyperlipidemia: The patient discontinued his statin secondary to myalgia. He does have a history of coronary disease with previous percutaneous interventions, so I recommended that he resume a low-dose of Crestor at 10 mg daily. If he is able to tolerate this, then we could potentially increase the dose." Pt wants to make sure that Dr. Rockey Situ is agreeable to this.

## 2014-02-24 NOTE — Telephone Encounter (Signed)
That sounds good to me

## 2014-02-24 NOTE — Telephone Encounter (Signed)
Spoke w/ pt.  Advised him of Dr. Donivan Scull recommendation.  Pt states that Dr. Lorelei Pont changed him to pravastatin, due to the cost of Crestor.  He states that he was up last night w/ horrible cramps and has not taken anymore.  Pt to discuss w/ Dr. Rockey Situ at next ov.

## 2014-03-06 ENCOUNTER — Ambulatory Visit (INDEPENDENT_AMBULATORY_CARE_PROVIDER_SITE_OTHER): Payer: Medicare Other | Admitting: Cardiovascular Disease

## 2014-03-06 ENCOUNTER — Encounter: Payer: Self-pay | Admitting: Cardiovascular Disease

## 2014-03-06 VITALS — BP 112/62 | HR 66 | Ht 68.5 in | Wt 246.5 lb

## 2014-03-06 DIAGNOSIS — I10 Essential (primary) hypertension: Secondary | ICD-10-CM

## 2014-03-06 DIAGNOSIS — I6521 Occlusion and stenosis of right carotid artery: Secondary | ICD-10-CM

## 2014-03-06 DIAGNOSIS — E785 Hyperlipidemia, unspecified: Secondary | ICD-10-CM

## 2014-03-06 DIAGNOSIS — I2510 Atherosclerotic heart disease of native coronary artery without angina pectoris: Secondary | ICD-10-CM

## 2014-03-06 NOTE — Progress Notes (Signed)
Patient ID: Hunter Young, male    DOB: November 22, 1940, 73 y.o.   MRN: [id number]  HPI Comments: Hunter Young is a very pleasant 73 year old gentleman with a history of stenting to his left circumflex, last catheterization in 2007 showing 40% proximal LAD disease, 70-80% mid RCA disease, nondominant vessel. He presents for routine followup of his coronary artery disease. 40 to 59% right carotid ultrasound in 2014  In follow-up today, he reports that he is doing well. His weight is down 6 pounds from his prior clinic visit. Occasionally has headaches in the morning that go away after he eats. Was tried on pravastatin for his hyperlipidemia and reports having severe leg cramping and has since stopped the medication He wonders if he would do better again on low-dose atorvastatin Otherwise denies any chest pain concerning for angina Metoprolol was stopped in the past secondary to bradycardia..  EKG shows normal sinus rhythm with rate 66 bpm, rare APC, nonspecific T-wave abnormality   Allergies  Allergen Reactions  . Oxycodone-Aspirin Nausea And Vomiting    Outpatient Encounter Prescriptions as of 03/06/2014  Medication Sig  . aspirin 81 MG tablet Take 81 mg by mouth daily.    Marland Kitchen lisinopril (PRINIVIL,ZESTRIL) 40 MG tablet TAKE 1 TABLET BY MOUTH EVERY DAY  . Multiple Vitamin (MULTIVITAMIN) tablet Take 1 tablet by mouth daily.    . nitroGLYCERIN (NITROSTAT) 0.4 MG SL tablet Place 1 tablet (0.4 mg total) under the tongue every 5 (five) minutes as needed.  . Omega-3 Fatty Acids (FISH OIL) 1000 MG CAPS Take by mouth daily.    Marland Kitchen omeprazole (PRILOSEC) 20 MG capsule TAKE ONE CAPSULE BY MOUTH EVERY DAY  . oxybutynin (DITROPAN) 5 MG tablet Take 1 tablet (5 mg total) by mouth 3 (three) times daily.  Marland Kitchen atorvastatin (LIPITOR) 40 MG tablet Take 1 tablet (40 mg total) by mouth daily.  . [DISCONTINUED] pravastatin (PRAVACHOL) 20 MG tablet Take 1 tablet (20 mg total) by mouth daily. (Patient not taking: Reported on  03/06/2014)    Past Medical History  Diagnosis Date  . ALLERGIC RHINITIS 04/22/2007  . ASTHMA, INTRINSIC NOS 12/27/2006  . BENIGN PROSTATIC HYPERTROPHY, WITH URINARY OBSTRUCTION 06/25/2007  . Cardiomegaly 05/30/2007  . CERVICAL DISORDER, NOS 10/14/2009  . CORONARY ARTERY DISEASE 12/03/2006  . DIVERTICULOSIS OF COLON 05/20/2001  . Dyshidrosis 06/25/2007  . GERD 12/06/2007  . HYPERLIPIDEMIA 12/03/2006  . HYPERTENSION 12/03/2006  . INTERNAL HEMORRHOIDS 05/20/2001  . OBESITY, MODERATE 12/05/2008  . OSTEOARTHRITIS 06/25/2007  . VENOUS INSUFFICIENCY 12/04/2006  . Carotid stenosis 01/29/2014  . Plantar fasciitis     Past Surgical History  Procedure Laterality Date  . Foot surgery    . Tonsillectomy    . Joint replacement      bilat knee  . Angioplasty      2 stents    Social History  reports that he quit smoking about 33 years ago. He has never used smokeless tobacco. He reports that he drinks about 12.6 oz of alcohol per week. He reports that he does not use illicit drugs.  Family History family history includes Colon cancer (age of onset: 75) in his mother.   Review of Systems  Constitutional: Negative.   Respiratory: Negative.   Cardiovascular: Negative.   Gastrointestinal: Negative.   Musculoskeletal: Negative.   Neurological: Negative.   Hematological: Negative.   Psychiatric/Behavioral: Negative.   All other systems reviewed and are negative.   BP 112/62 mmHg  Pulse 66  Ht 5' 8.5" (1.74 m)  Wt 246 lb 8 oz (111.812 kg)  BMI 36.93 kg/m2  Physical Exam  Constitutional: He is oriented to person, place, and time. He appears well-developed and well-nourished.  HENT:  Head: Normocephalic.  Nose: Nose normal.  Mouth/Throat: Oropharynx is clear and moist.  Eyes: Conjunctivae are normal. Pupils are equal, round, and reactive to light.  Neck: Normal range of motion. Neck supple. No JVD present.  Cardiovascular: Normal rate, regular rhythm, S1 normal, S2 normal, normal heart sounds  and intact distal pulses.  Exam reveals no gallop and no friction rub.   No murmur heard. Pulmonary/Chest: Effort normal and breath sounds normal. No respiratory distress. He has no wheezes. He has no rales. He exhibits no tenderness.  Abdominal: Soft. Bowel sounds are normal. He exhibits no distension. There is no tenderness.  Musculoskeletal: Normal range of motion. He exhibits no edema or tenderness.  Lymphadenopathy:    He has no cervical adenopathy.  Neurological: He is alert and oriented to person, place, and time. Coordination normal.  Skin: Skin is warm and dry. No rash noted. No erythema.  Psychiatric: He has a normal mood and affect. His behavior is normal. Judgment and thought content normal.      Assessment and Plan   Nursing note and vitals reviewed.

## 2014-03-06 NOTE — Assessment & Plan Note (Signed)
Currently with no symptoms of angina. No further workup at this time. Continue current medication regimen. 

## 2014-03-06 NOTE — Assessment & Plan Note (Signed)
40-59% right carotid stenosis in 2014 Continue aggressive cholesterol management as above, repeat scan in 2016

## 2014-03-06 NOTE — Assessment & Plan Note (Addendum)
We have encouraged continued exercise, careful diet management in an effort to lose weight. He has lost 6 pounds from his prior clinic visit

## 2014-03-06 NOTE — Assessment & Plan Note (Signed)
Blood pressure is well controlled on today's visit. No changes made to the medications. 

## 2014-03-06 NOTE — Assessment & Plan Note (Signed)
He is unable to tolerate pravastatin. We will restart  Atorvastatin 20 mg daily with slow titration up to 40 mg daily. Previous myalgias on 80 mg daily. If tolerable at the 40 mg dose, could add zetia daily to reach goal Suggested he continue his weight loss

## 2014-03-06 NOTE — Patient Instructions (Addendum)
You are doing well.  Please retry the atorvastatin 20 up to 40 mg daily as tolerated Call the office if you continue to have symptoms  Look up the price of zetia  Try CoEnz Q10 for muscle cramping  Call for labs in 3 months, fasting cholesterol  Please call us if you have new issues that need to be addressed before your next appt.  Your physician wants you to follow-up in: 6 months.  You will receive a reminder letter in the mail two months in advance. If you don't receive a letter, please call our office to schedule the follow-up appointment.

## 2014-04-06 DIAGNOSIS — M722 Plantar fascial fibromatosis: Secondary | ICD-10-CM | POA: Diagnosis not present

## 2014-04-06 DIAGNOSIS — M216X1 Other acquired deformities of right foot: Secondary | ICD-10-CM | POA: Diagnosis not present

## 2014-04-08 ENCOUNTER — Other Ambulatory Visit: Payer: Self-pay

## 2014-04-08 MED ORDER — LISINOPRIL 40 MG PO TABS: 40.0000 mg | ORAL_TABLET | Freq: Every day | ORAL | Status: DC

## 2014-04-08 NOTE — Telephone Encounter (Signed)
Pt request refill lisinopril to [facility] in [place]; advised pt done.

## 2014-04-10 NOTE — Telephone Encounter (Signed)
Pt said [facility] [place] did not have refill for lisinopril; spoke with Jinny Blossom at [facility] and pt picked up # 10 on 04/08/14 and ins will not allow another refill until 04/15/14. Pt voiced understanding.

## 2014-04-15 ENCOUNTER — Other Ambulatory Visit: Payer: Self-pay | Admitting: Family Medicine

## 2014-04-20 ENCOUNTER — Telehealth: Payer: Self-pay | Admitting: Cardiovascular Disease

## 2014-04-20 ENCOUNTER — Other Ambulatory Visit: Payer: Self-pay | Admitting: *Deleted

## 2014-04-20 MED ORDER — ATORVASTATIN CALCIUM 40 MG PO TABS: 40.0000 mg | ORAL_TABLET | Freq: Every day | ORAL | Status: DC

## 2014-04-20 NOTE — Telephone Encounter (Signed)
°  1. Which medications need to be refilled? Atorvastatin    2. Which pharmacy is medication to be sent to? [facility]  3. Do they need a 30 day or 90 day supply? 90  4. Would they like a call back once the medication has been sent to the pharmacy? No

## 2014-06-04 ENCOUNTER — Ambulatory Visit: Payer: Self-pay | Admitting: Podiatry

## 2014-06-09 ENCOUNTER — Ambulatory Visit (INDEPENDENT_AMBULATORY_CARE_PROVIDER_SITE_OTHER): Payer: Medicare Other

## 2014-06-09 ENCOUNTER — Encounter: Payer: Self-pay | Admitting: Podiatry

## 2014-06-09 ENCOUNTER — Ambulatory Visit (INDEPENDENT_AMBULATORY_CARE_PROVIDER_SITE_OTHER): Payer: Medicare Other | Admitting: Podiatry

## 2014-06-09 VITALS — BP 134/84 | HR 86 | Resp 18 | Ht 69.0 in | Wt 240.0 lb

## 2014-06-09 DIAGNOSIS — M722 Plantar fascial fibromatosis: Secondary | ICD-10-CM | POA: Diagnosis not present

## 2014-06-09 NOTE — Patient Instructions (Signed)

## 2014-06-09 NOTE — Progress Notes (Signed)
Subjective:    Patient ID: Hunter Young, male    DOB: [birth year], 74 y.o.   MRN: [id number]  HPI  74 year old male presents the operative complains of right heel pain which has been ongoing since last year. He states that he previously had seen Dr. Yolanda Bonine, M.D. in [place] for the heel pain. He states an MRI was performed as well as an x-ray. He previously been treated for plantar fasciitis in which she has had 2 injections in the area over the last several months. He has also been stretching as well as applying ice to the area. He states that he has pain in the mornings which is relieved by ambulation and he also has pain in the heel after standing on concrete floors all day. He also states that he had some pain in the arch previously however that pain is subsided with using over-the-counter insert. Denies any history of injury or trauma to the area or any change or increase in activity the time of onset of symptoms. Denies any numbness or tingling. The pain does not wake him up at night. No other complaints at this time.    Review of Systems  All other systems reviewed and are negative.      Objective:   Physical Exam  AAO x3, NAD DP/PT pulses palpable bilaterally, CRT less than 3 seconds Protective sensation intact with Simms Weinstein monofilament, vibratory sensation intact, Achilles tendon reflex intact Tenderness to palpation overlying the plantar medial tubercle of the calcaneus to right heel at the insertion of the plantar fascia. There is no pain along the course of plantar fascial within the arch of the foot and the plantar fascia appears intact. There is no pain with lateral compression of the calcaneus or pain the vibratory sensation. No pain on the posterior aspect of the calcaneus or along the course/insertion of the Achilles tendon. There is no overlying edema, erythema, increase in warmth. No other areas of tenderness palpation or pain with vibratory sensation to the  foot/ankle bilaterally. Is a decrease in medial arch height upon weightbearing. MMT 5/5, ROM WNL No open lesions or pre-ulcerative lesions are identified. No pain with calf compression, swelling, warmth, erythema.     Assessment & Plan:   74 year old male with right heel pain, likely plantar fasciitis. -Previous MRI and x-rays were reviewed and new x-rays were obtained. Upon my evaluation MRI that plantar fascia appears to be mildly thickened. -Treatment options were discussed the patient clear alternatives, risks, complications. -Patient elects to proceed with steroid injection into the right heel. Under sterile skin preparation, a total of 2 cc of Celestone, 0.5% Marcaine plain, and 2% lidocaine plain were infiltrated into the symptomatic area without complication. A band-aid was applied. Patient tolerated the injection well without complication. Post-injection care with discussed with the patient. Discussed with the patient to ice the area over the next couple of days to help prevent a steroid flare.  -Plantar fascial taping was applied -Continue stretching and icing activities.  -Discussed shoegear modifications. He states he is pretty purchase multiple over-the-counter inserts Sure which ones to the wear. I recommended him to bring the inserts with her to his next appointment. He has a prescription for custom molded orthotics however he has not yet had been made. Also recommended patient to continue to wear shoe at all times even in the house to help provide support. -Follow-up in 3 weeks or sooner if any problems are to arise. In the meantime encouraged to call  the office with any questions, concerns, change in symptoms.

## 2014-06-11 ENCOUNTER — Other Ambulatory Visit (INDEPENDENT_AMBULATORY_CARE_PROVIDER_SITE_OTHER): Payer: Medicare Other | Admitting: *Deleted

## 2014-06-11 DIAGNOSIS — E785 Hyperlipidemia, unspecified: Secondary | ICD-10-CM

## 2014-06-11 DIAGNOSIS — I10 Essential (primary) hypertension: Secondary | ICD-10-CM

## 2014-06-11 DIAGNOSIS — I2510 Atherosclerotic heart disease of native coronary artery without angina pectoris: Secondary | ICD-10-CM | POA: Diagnosis not present

## 2014-06-12 LAB — LIPID PANEL
CHOL/HDL RATIO: 2.4 ratio (ref 0.0–5.0)
CHOLESTEROL TOTAL: 161 mg/dL (ref 100–199)
HDL: 68 mg/dL (ref >39)
LDL CALC: 80 mg/dL (ref 0–99)
TRIGLYCERIDES: 65 mg/dL (ref 0–149)
VLDL Cholesterol Cal: 13 mg/dL (ref 5–40)

## 2014-06-30 ENCOUNTER — Ambulatory Visit: Payer: Medicare Other | Admitting: Podiatry

## 2014-07-07 ENCOUNTER — Ambulatory Visit (INDEPENDENT_AMBULATORY_CARE_PROVIDER_SITE_OTHER): Payer: Medicare Other | Admitting: Podiatry

## 2014-07-07 ENCOUNTER — Encounter: Payer: Self-pay | Admitting: Podiatry

## 2014-07-07 VITALS — BP 150/83 | HR 66 | Resp 16 | Ht 69.0 in | Wt 238.0 lb

## 2014-07-07 DIAGNOSIS — M722 Plantar fascial fibromatosis: Secondary | ICD-10-CM | POA: Diagnosis not present

## 2014-07-07 NOTE — Patient Instructions (Signed)
Plantar Fasciitis (Heel Spur Syndrome) with Rehab The plantar fascia is a fibrous, ligament-like, soft-tissue structure that spans the bottom of the foot. Plantar fasciitis is a condition that causes pain in the foot due to inflammation of the tissue. SYMPTOMS   Pain and tenderness on the underneath side of the foot.  Pain that worsens with standing or walking. CAUSES  Plantar fasciitis is caused by irritation and injury to the plantar fascia on the underneath side of the foot. Common mechanisms of injury include:  Direct trauma to bottom of the foot.  Damage to a small nerve that runs under the foot where the main fascia attaches to the heel bone.  Stress placed on the plantar fascia due to bone spurs. RISK INCREASES WITH:   Activities that place stress on the plantar fascia (running, jumping, pivoting, or cutting).  Poor strength and flexibility.  Improperly fitted shoes.  Tight calf muscles.  Flat feet.  Failure to warm-up properly before activity.  Obesity. PREVENTION  Warm up and stretch properly before activity.  Allow for adequate recovery between workouts.  Maintain physical fitness:  Strength, flexibility, and endurance.  Cardiovascular fitness.  Maintain a health body weight.  Avoid stress on the plantar fascia.  Wear properly fitted shoes, including arch supports for individuals who have flat feet. PROGNOSIS  If treated properly, then the symptoms of plantar fasciitis usually resolve without surgery. However, occasionally surgery is necessary. RELATED COMPLICATIONS   Recurrent symptoms that may result in a chronic condition.  Problems of the lower back that are caused by compensating for the injury, such as limping.  Pain or weakness of the foot during push-off following surgery.  Chronic inflammation, scarring, and partial or complete fascia tear, occurring more often from repeated injections. TREATMENT  Treatment initially involves the use of  ice and medication to help reduce pain and inflammation. The use of strengthening and stretching exercises may help reduce pain with activity, especially stretches of the Achilles tendon. These exercises may be performed at home or with a therapist. Your caregiver may recommend that you use heel cups of arch supports to help reduce stress on the plantar fascia. Occasionally, corticosteroid injections are given to reduce inflammation. If symptoms persist for greater than 6 months despite non-surgical (conservative), then surgery may be recommended.  MEDICATION   If pain medication is necessary, then nonsteroidal anti-inflammatory medications, such as aspirin and ibuprofen, or other minor pain relievers, such as acetaminophen, are often recommended.  Do not take pain medication within 7 days before surgery.  Prescription pain relievers may be given if deemed necessary by your caregiver. Use only as directed and only as much as you need.  Corticosteroid injections may be given by your caregiver. These injections should be reserved for the most serious cases, because they may only be given a certain number of times. HEAT AND COLD  Cold treatment (icing) relieves pain and reduces inflammation. Cold treatment should be applied for 10 to 15 minutes every 2 to 3 hours for inflammation and pain and immediately after any activity that aggravates your symptoms. Use ice packs or massage the area with a piece of ice (ice massage).  Heat treatment may be used prior to performing the stretching and strengthening activities prescribed by your caregiver, physical therapist, or athletic trainer. Use a heat pack or soak the injury in warm water. SEEK IMMEDIATE MEDICAL CARE IF:  Treatment seems to offer no benefit, or the condition worsens.  Any medications produce adverse side effects. EXERCISES RANGE   OF MOTION (ROM) AND STRETCHING EXERCISES - Plantar Fasciitis (Heel Spur Syndrome) These exercises may help you  when beginning to rehabilitate your injury. Your symptoms may resolve with or without further involvement from your physician, physical therapist or athletic trainer. While completing these exercises, remember:   Restoring tissue flexibility helps normal motion to return to the joints. This allows healthier, less painful movement and activity.  An effective stretch should be held for at least 30 seconds.  A stretch should never be painful. You should only feel a gentle lengthening or release in the stretched tissue. RANGE OF MOTION - Toe Extension, Flexion  Sit with your right / left leg crossed over your opposite knee.  Grasp your toes and gently pull them back toward the top of your foot. You should feel a stretch on the bottom of your toes and/or foot.  Hold this stretch for __________ seconds.  Now, gently pull your toes toward the bottom of your foot. You should feel a stretch on the top of your toes and or foot.  Hold this stretch for __________ seconds. Repeat __________ times. Complete this stretch __________ times per day.  RANGE OF MOTION - Ankle Dorsiflexion, Active Assisted  Remove shoes and sit on a chair that is preferably not on a carpeted surface.  Place right / left foot under knee. Extend your opposite leg for support.  Keeping your heel down, slide your right / left foot back toward the chair until you feel a stretch at your ankle or calf. If you do not feel a stretch, slide your bottom forward to the edge of the chair, while still keeping your heel down.  Hold this stretch for __________ seconds. Repeat __________ times. Complete this stretch __________ times per day.  STRETCH - Gastroc, Standing  Place hands on wall.  Extend right / left leg, keeping the front knee somewhat bent.  Slightly point your toes inward on your back foot.  Keeping your right / left heel on the floor and your knee straight, shift your weight toward the wall, not allowing your back to  arch.  You should feel a gentle stretch in the right / left calf. Hold this position for __________ seconds. Repeat __________ times. Complete this stretch __________ times per day. STRETCH - Soleus, Standing  Place hands on wall.  Extend right / left leg, keeping the other knee somewhat bent.  Slightly point your toes inward on your back foot.  Keep your right / left heel on the floor, bend your back knee, and slightly shift your weight over the back leg so that you feel a gentle stretch deep in your back calf.  Hold this position for __________ seconds. Repeat __________ times. Complete this stretch __________ times per day. STRETCH - Gastrocsoleus, Standing  Note: This exercise can place a lot of stress on your foot and ankle. Please complete this exercise only if specifically instructed by your caregiver.   Place the ball of your right / left foot on a step, keeping your other foot firmly on the same step.  Hold on to the wall or a rail for balance.  Slowly lift your other foot, allowing your body weight to press your heel down over the edge of the step.  You should feel a stretch in your right / left calf.  Hold this position for __________ seconds.  Repeat this exercise with a slight bend in your right / left knee. Repeat __________ times. Complete this stretch __________ times per day.    STRENGTHENING EXERCISES - Plantar Fasciitis (Heel Spur Syndrome)  These exercises may help you when beginning to rehabilitate your injury. They may resolve your symptoms with or without further involvement from your physician, physical therapist or athletic trainer. While completing these exercises, remember:   Muscles can gain both the endurance and the strength needed for everyday activities through controlled exercises.  Complete these exercises as instructed by your physician, physical therapist or athletic trainer. Progress the resistance and repetitions only as guided. STRENGTH -  Towel Curls  Sit in a chair positioned on a non-carpeted surface.  Place your foot on a towel, keeping your heel on the floor.  Pull the towel toward your heel by only curling your toes. Keep your heel on the floor.  If instructed by your physician, physical therapist or athletic trainer, add ____________________ at the end of the towel. Repeat __________ times. Complete this exercise __________ times per day. STRENGTH - Ankle Inversion  Secure one end of a rubber exercise band/tubing to a fixed object (table, pole). Loop the other end around your foot just before your toes.  Place your fists between your knees. This will focus your strengthening at your ankle.  Slowly, pull your big toe up and in, making sure the band/tubing is positioned to resist the entire motion.  Hold this position for __________ seconds.  Have your muscles resist the band/tubing as it slowly pulls your foot back to the starting position. Repeat __________ times. Complete this exercises __________ times per day.  Document Released: 03/13/2005 Document Revised: 06/05/2011 Document Reviewed: 06/25/2008 [employer] Patient Information 2015 [employer]. This information is not intended to replace advice given to you by your health care provider. Make sure you discuss any questions you have with your health care provider.  

## 2014-07-08 ENCOUNTER — Encounter: Payer: Self-pay | Admitting: Podiatry

## 2014-07-08 NOTE — Progress Notes (Signed)
Patient ID: Hunter Young, male   DOB: 1940-11-07, 74 y.o.   MRN: [id number]  Subjective: 74 year old male presents the office they for follow-up evaluation of right heel pain. He states after the last injection he had was complete relief of symptoms until proximal and one week ago and the symptoms started to recur. He states that he has some mild discomfort to the right heel although is not a significant was what it was prior. He continues wearing orthotic inside of his shoe and he is brought and multiple of them today for me to evaluate. He continues to the stretching icing intermittently. Denies any recent injury or trauma. No other complaints at this time.  Objective: AAO x3, NAD DP/PT pulses palpable bilaterally, CRT less than 3 seconds Protective sensation intact with Simms Weinstein monofilament, vibratory sensation intact, Achilles tendon reflex intact Decreased tenderness to palpation overlying the plantar medial tubercle of the calcaneus to right heel at the insertion of the plantar fascia. There is no pain along the course of plantar fascial within the arch of the foot. There is no pain with lateral compression of the calcaneus or pain the vibratory sensation. No pain on the posterior aspect of the calcaneus or along the course/insertion of the Achilles tendon. There is no overlying edema, erythema, increase in warmth. No other areas of tenderness palpation or pain with vibratory sensation to the foot/ankle. MMT 5/5, ROM WNL No open lesions or pre-ulcerative lesions are identified. No pain with calf compression, swelling, warmth, erythema.  Assessment: 74 year old male with resolving right foot plantar fasciitis  Plan: -At this time discussed another steroid injection however he is are he had 3 within the last several months and I recommended to hold off on further steroid injection for now. -Hold off on any anti-inflammatories given CAD -Continue with the stretching and icing  activities. -Recommended night splint. He states she'll purchase one over-the-counter. -Discussed with the patient orthotics and I went through his orthotics with him. -Recommended patient to call the office if symptoms are not completely resolved within 3 weeks or to call the office sooner if any palms are to arise. In the meantime encouraged to call the office with any questions, concerns, change in symptoms.

## 2014-08-12 ENCOUNTER — Ambulatory Visit (INDEPENDENT_AMBULATORY_CARE_PROVIDER_SITE_OTHER): Payer: Medicare Other | Admitting: Family Medicine

## 2014-08-12 ENCOUNTER — Encounter: Payer: Self-pay | Admitting: Family Medicine

## 2014-08-12 VITALS — BP 134/72 | HR 59 | Temp 97.7°F | Wt 246.5 lb

## 2014-08-12 DIAGNOSIS — H8110 Benign paroxysmal vertigo, unspecified ear: Secondary | ICD-10-CM

## 2014-08-12 MED ORDER — MECLIZINE HCL 25 MG PO TABS: 25.0000 mg | ORAL_TABLET | Freq: Three times a day (TID) | ORAL | Status: DC | PRN

## 2014-08-12 MED ORDER — DIAZEPAM 2 MG PO TABS: 2.0000 mg | ORAL_TABLET | Freq: Four times a day (QID) | ORAL | Status: DC | PRN

## 2014-08-12 NOTE — Progress Notes (Signed)
Pre visit review using our clinic review tool, if applicable. No additional management support is needed unless otherwise documented below in the visit note. 

## 2014-08-12 NOTE — Progress Notes (Signed)
Dr. Frederico Hamman T. Derrien Anschutz, MD, [facility] [place], [address] Phone: [phone] Fax: [phone]  08/12/2014  Patient: Hunter Young, MRN: [id number], DOB: December 30, 1940, 74 y.o.  Primary Physician:  Owens Loffler, MD  Chief Complaint: Dizziness  Subjective:   Hunter Young is a 74 y.o. very pleasant male patient who presents with the following:  Has a history of vertigo and ears has a history of tinnitus. Last week did a lot of yard work and has a headache all of the time. Sitting and laying, he is ok. Walking and moving. Room spinning.  Will get a sensation that he is drunk. Trouble walking some.   Past Medical History, Surgical History, Social History, Family History, Problem List, Medications, and Allergies have been reviewed and updated if relevant.   GEN: No acute illnesses, no fevers, chills. GI: No n/v/d, eating normally Pulm: No SOB Interactive and getting along well at home.  Otherwise, ROS is as per the HPI.  Objective:   BP 134/72 mmHg  Pulse 59  Temp(Src) 97.7 F (36.5 C) (Oral)  Wt 246 lb 8 oz (111.812 kg)  SpO2 97%  GEN: WDWN, NAD, Non-toxic, A & O x 3 HEENT: Atraumatic, Normocephalic. Neck supple. No masses, No LAD. Inducible vertigo with head motion and more with vertical movement. Ears and Nose: No external deformity. CV: RRR, No M/G/R. No JVD. No thrill. No extra heart sounds. PULM: CTA B, no wheezes, crackles, rhonchi. No retractions. No resp. distress. No accessory muscle use. EXTR: No c/c/e NEURO Normal gait.  PSYCH: Normally interactive. Conversant. Not depressed or anxious appearing.  Calm demeanor.   Laboratory and Imaging Data:  Assessment and Plan:   BPV (benign positional vertigo), unspecified laterality  Most likely this will resolve within the next 2-3 weeks.  If he is still having persistent symptoms in a few weeks, then we may need to look for other causes.  Follow-up: No  Follow-up on file.  New Prescriptions   DIAZEPAM (VALIUM) 2 MG TABLET    Take 1 tablet (2 mg total) by mouth every 6 (six) hours as needed for anxiety.   MECLIZINE (ANTIVERT) 25 MG TABLET    Take 1 tablet (25 mg total) by mouth 3 (three) times daily as needed for dizziness.   No orders of the defined types were placed in this encounter.    Signed,  Maud Deed. Lavender Stanke, MD   Patient's Medications  New Prescriptions   DIAZEPAM (VALIUM) 2 MG TABLET    Take 1 tablet (2 mg total) by mouth every 6 (six) hours as needed for anxiety.   MECLIZINE (ANTIVERT) 25 MG TABLET    Take 1 tablet (25 mg total) by mouth 3 (three) times daily as needed for dizziness.  Previous Medications   ASPIRIN 81 MG TABLET    Take 81 mg by mouth daily.     ATORVASTATIN (LIPITOR) 40 MG TABLET    Take 1 tablet (40 mg total) by mouth daily.   LISINOPRIL (PRINIVIL,ZESTRIL) 40 MG TABLET    TAKE 1 TABLET BY MOUTH EVERY DAY   MULTIPLE VITAMIN (MULTIVITAMIN) TABLET    Take 1 tablet by mouth daily.     NITROGLYCERIN (NITROSTAT) 0.4 MG SL TABLET    Place 1 tablet (0.4 mg total) under the tongue every 5 (five) minutes as needed.   OMEGA-3 FATTY ACIDS (FISH OIL) 1000 MG CAPS    Take by mouth daily.  OMEPRAZOLE (PRILOSEC) 20 MG CAPSULE    TAKE ONE CAPSULE BY MOUTH EVERY DAY   OXYBUTYNIN (DITROPAN) 5 MG TABLET    Take 1 tablet (5 mg total) by mouth 3 (three) times daily.  Modified Medications   No medications on file  Discontinued Medications   No medications on file

## 2014-09-07 DIAGNOSIS — L570 Actinic keratosis: Secondary | ICD-10-CM | POA: Diagnosis not present

## 2014-09-30 ENCOUNTER — Encounter: Payer: Self-pay | Admitting: Podiatry

## 2015-01-13 DIAGNOSIS — H16211 Exposure keratoconjunctivitis, right eye: Secondary | ICD-10-CM | POA: Diagnosis not present

## 2015-02-11 ENCOUNTER — Ambulatory Visit (INDEPENDENT_AMBULATORY_CARE_PROVIDER_SITE_OTHER): Payer: Medicare Other | Admitting: Cardiovascular Disease

## 2015-02-11 ENCOUNTER — Encounter: Payer: Self-pay | Admitting: Cardiovascular Disease

## 2015-02-11 VITALS — BP 118/68 | HR 63 | Ht 68.5 in | Wt 253.8 lb

## 2015-02-11 DIAGNOSIS — Z23 Encounter for immunization: Secondary | ICD-10-CM | POA: Diagnosis not present

## 2015-02-11 DIAGNOSIS — I10 Essential (primary) hypertension: Secondary | ICD-10-CM

## 2015-02-11 DIAGNOSIS — I2510 Atherosclerotic heart disease of native coronary artery without angina pectoris: Secondary | ICD-10-CM

## 2015-02-11 DIAGNOSIS — E785 Hyperlipidemia, unspecified: Secondary | ICD-10-CM | POA: Diagnosis not present

## 2015-02-11 NOTE — Assessment & Plan Note (Signed)
Blood pressure is well controlled on today's visit. No changes made to the medications. 

## 2015-02-11 NOTE — Assessment & Plan Note (Signed)
Currently with no symptoms of angina. No further workup at this time. Continue current medication regimen. 

## 2015-02-11 NOTE — Assessment & Plan Note (Signed)
We have encouraged continued exercise, careful diet management in an effort to lose weight. 

## 2015-02-11 NOTE — Patient Instructions (Signed)
You are doing well. No medication changes were made.  Please call us if you have new issues that need to be addressed before your next appt.  Your physician wants you to follow-up in: 12 months.  You will receive a reminder letter in the mail two months in advance. If you don't receive a letter, please call our office to schedule the follow-up appointment. 

## 2015-02-11 NOTE — Progress Notes (Signed)
Patient ID: Hunter Young, male    DOB: 1940-06-02, 74 y.o.   MRN: [id number]  HPI Comments: [name] is a very pleasant 74 year old gentleman with a history of stenting to his left circumflex, last catheterization in 2007 showing 40% proximal LAD disease, 70-80% mid RCA disease, nondominant vessel. He presents for routine followup of his coronary artery disease. 40 to 59% right carotid ultrasound in 2014  In follow-up today, he reports that he is doing well.  Tolerating Lipitor 40 mg daily, cholesterol down to 160 Weight continues to be a problem, no regular exercise program Otherwise denies any chest pain concerning for angina Metoprolol was stopped in the past secondary to bradycardia.  No smoking history, no significant diabetes, glucose level borderline  EKG shows normal sinus rhythm with rate 36 bpm, rare APC, nonspecific T-wave abnormality   Allergies  Allergen Reactions  . Oxycodone-Aspirin Nausea And Vomiting    Outpatient Encounter Prescriptions as of 02/11/2015  Medication Sig  . aspirin 81 MG tablet Take 81 mg by mouth daily.    Marland Kitchen atorvastatin (LIPITOR) 40 MG tablet Take 1 tablet (40 mg total) by mouth daily.  . Coenzyme Q10 (COQ10) 200 MG CAPS Take by mouth daily.  Marland Kitchen lisinopril (PRINIVIL,ZESTRIL) 40 MG tablet TAKE 1 TABLET BY MOUTH EVERY DAY  . Multiple Vitamin (MULTIVITAMIN) tablet Take 1 tablet by mouth daily.    . Multiple Vitamins-Minerals (PRESERVISION AREDS 2 PO) Take by mouth 2 (two) times daily.  . nitroGLYCERIN (NITROSTAT) 0.4 MG SL tablet Place 1 tablet (0.4 mg total) under the tongue every 5 (five) minutes as needed.  . Omega-3 Fatty Acids (FISH OIL) 1000 MG CAPS Take by mouth daily.    Marland Kitchen omeprazole (PRILOSEC) 20 MG capsule TAKE ONE CAPSULE BY MOUTH EVERY DAY  . oxybutynin (DITROPAN) 5 MG tablet Take 1 tablet (5 mg total) by mouth 3 (three) times daily.  . [DISCONTINUED] diazepam (VALIUM) 2 MG tablet Take 1 tablet (2 mg total) by mouth every 6 (six) hours as  needed for anxiety. (Patient not taking: Reported on 02/11/2015)  . [DISCONTINUED] meclizine (ANTIVERT) 25 MG tablet Take 1 tablet (25 mg total) by mouth 3 (three) times daily as needed for dizziness. (Patient not taking: Reported on 02/11/2015)   No facility-administered encounter medications on file as of 02/11/2015.    Past Medical History  Diagnosis Date  . ALLERGIC RHINITIS 04/22/2007  . ASTHMA, INTRINSIC NOS 12/27/2006  . BENIGN PROSTATIC HYPERTROPHY, WITH URINARY OBSTRUCTION 06/25/2007  . Cardiomegaly 05/30/2007  . CERVICAL DISORDER, NOS 10/14/2009  . CORONARY ARTERY DISEASE 12/03/2006  . DIVERTICULOSIS OF COLON 05/20/2001  . Dyshidrosis 06/25/2007  . GERD 12/06/2007  . HYPERLIPIDEMIA 12/03/2006  . HYPERTENSION 12/03/2006  . INTERNAL HEMORRHOIDS 05/20/2001  . OBESITY, MODERATE 12/05/2008  . OSTEOARTHRITIS 06/25/2007  . VENOUS INSUFFICIENCY 12/04/2006  . Carotid stenosis 01/29/2014  . Plantar fasciitis     Past Surgical History  Procedure Laterality Date  . Foot surgery    . Tonsillectomy    . Joint replacement      bilat knee  . Angioplasty      2 stents    Social History  reports that he quit smoking about 34 years ago. He has never used smokeless tobacco. He reports that he drinks about 12.6 oz of alcohol per week. He reports that he does not use illicit drugs.  Family History family history includes Colon cancer (age of onset: 60) in his mother.   Review of Systems  Constitutional: Negative.  Respiratory: Negative.   Cardiovascular: Negative.   Gastrointestinal: Negative.   Musculoskeletal: Negative.   Neurological: Negative.   Hematological: Negative.   Psychiatric/Behavioral: Negative.   All other systems reviewed and are negative.   BP 118/68 mmHg  Pulse 63  Ht 5' 8.5" (1.74 m)  Wt 253 lb 12 oz (115.1 kg)  BMI 38.02 kg/m2  Physical Exam  Constitutional: He is oriented to person, place, and time. He appears well-developed and well-nourished.  Obese  HENT:   Head: Normocephalic.  Nose: Nose normal.  Mouth/Throat: Oropharynx is clear and moist.  Eyes: Conjunctivae are normal. Pupils are equal, round, and reactive to light.  Neck: Normal range of motion. Neck supple. No JVD present.  Cardiovascular: Normal rate, regular rhythm, S1 normal, S2 normal, normal heart sounds and intact distal pulses.  Exam reveals no gallop and no friction rub.   No murmur heard. Pulmonary/Chest: Effort normal and breath sounds normal. No respiratory distress. He has no wheezes. He has no rales. He exhibits no tenderness.  Abdominal: Soft. Bowel sounds are normal. He exhibits no distension. There is no tenderness.  Musculoskeletal: Normal range of motion. He exhibits no edema or tenderness.  Lymphadenopathy:    He has no cervical adenopathy.  Neurological: He is alert and oriented to person, place, and time. Coordination normal.  Skin: Skin is warm and dry. No rash noted. No erythema.  Psychiatric: He has a normal mood and affect. His behavior is normal. Judgment and thought content normal.      Assessment and Plan   Nursing note and vitals reviewed.

## 2015-02-11 NOTE — Assessment & Plan Note (Signed)
Cholesterol close to goal. No medication changes made, recommended weight loss, exercise program

## 2015-02-24 ENCOUNTER — Telehealth: Payer: Self-pay | Admitting: Family Medicine

## 2015-02-24 NOTE — Telephone Encounter (Signed)
Please call and schedule [facility] with fasting labs for Dr. Lorelei Pont.

## 2015-02-25 DIAGNOSIS — H02132 Senile ectropion of right lower eyelid: Secondary | ICD-10-CM | POA: Diagnosis not present

## 2015-02-25 NOTE — Telephone Encounter (Signed)
Left message asking pt to call office  °

## 2015-02-25 NOTE — Telephone Encounter (Signed)
Pt is schedule for fasting cpe labs on 07/19/15 at 8:15am and [facility] wellness scheduled with Dr. Lorelei Pont on 07/26/15 at 8:30am. Pt is aware of appt. Please close.

## 2015-03-04 DIAGNOSIS — M216X1 Other acquired deformities of right foot: Secondary | ICD-10-CM | POA: Diagnosis not present

## 2015-03-15 ENCOUNTER — Other Ambulatory Visit: Payer: Self-pay | Admitting: Family Medicine

## 2015-04-05 DIAGNOSIS — M2141 Flat foot [pes planus] (acquired), right foot: Secondary | ICD-10-CM | POA: Diagnosis not present

## 2015-04-15 DIAGNOSIS — H02132 Senile ectropion of right lower eyelid: Secondary | ICD-10-CM | POA: Diagnosis not present

## 2015-04-19 ENCOUNTER — Other Ambulatory Visit: Payer: Self-pay | Admitting: Family Medicine

## 2015-04-19 ENCOUNTER — Other Ambulatory Visit: Payer: Self-pay | Admitting: Cardiovascular Disease

## 2015-06-18 ENCOUNTER — Ambulatory Visit (INDEPENDENT_AMBULATORY_CARE_PROVIDER_SITE_OTHER)
Admission: RE | Admit: 2015-06-18 | Discharge: 2015-06-18 | Disposition: A | Discharge status: Home / Self Care | Payer: Medicare Other | Source: Ambulatory Visit | Attending: Primary Care | Admitting: Primary Care

## 2015-06-18 ENCOUNTER — Ambulatory Visit (INDEPENDENT_AMBULATORY_CARE_PROVIDER_SITE_OTHER): Payer: Medicare Other | Admitting: Primary Care

## 2015-06-18 ENCOUNTER — Encounter: Payer: Self-pay | Admitting: Primary Care

## 2015-06-18 ENCOUNTER — Telehealth: Payer: Self-pay | Admitting: Family Medicine

## 2015-06-18 VITALS — BP 116/64 | HR 64 | Temp 97.5°F | Wt 257.0 lb

## 2015-06-18 DIAGNOSIS — R0781 Pleurodynia: Secondary | ICD-10-CM

## 2015-06-18 DIAGNOSIS — S2231XA Fracture of one rib, right side, initial encounter for closed fracture: Secondary | ICD-10-CM | POA: Diagnosis not present

## 2015-06-18 MED ORDER — TRAMADOL HCL 50 MG PO TABS: 50.0000 mg | ORAL_TABLET | Freq: Three times a day (TID) | ORAL | Status: DC | PRN

## 2015-06-18 NOTE — Telephone Encounter (Signed)
Result noted sent to [facility]. Also released and discussed results via My Chart.

## 2015-06-18 NOTE — Patient Instructions (Signed)
Complete xray(s) prior to leaving today. I will notify you of your results once received.  You may take the Tramadol tablets as needed for pain. Take 1 to 2 tablets by mouth every 8 hours as needed for pain. Caution as this medication may make you drowsy. Do not drive after taking.  Ensure you take deep breaths every 2 hours to ensure your lungs stay expanded.  It was a pleasure meeting you!

## 2015-06-18 NOTE — Telephone Encounter (Signed)
Pt called to get xray results  

## 2015-06-18 NOTE — Progress Notes (Signed)
Subjective:    Patient ID: Hunter Young, male    DOB: [birth year], 75 y.o.   MRN: [id number]  HPI  Hunter Young is a 75 year old male who presents today with a chief complaint of rib pain. His pain is located to the right anterior chest wall since falling yesterday afternoon. He was up on a step stool in his work shed. He lost his balance and fell forward hitting a tractor tail gate. Denies hitting his head or losing consciousness. He's taken motrin for his pain yesterday, but nothing today. His pain is worse with movement, cough, deep breathing, moving from laying to sitting. When he's upright his pain is no too bothersome.   Review of Systems  Respiratory: Negative for shortness of breath.   Cardiovascular: Negative for chest pain.       Chest wall pain to right lateral ribs.  Musculoskeletal:       Right rib pain  Skin: Negative for color change and wound.  Neurological: Negative for dizziness and headaches.       Past Medical History  Diagnosis Date  . ALLERGIC RHINITIS 04/22/2007  . ASTHMA, INTRINSIC NOS 12/27/2006  . BENIGN PROSTATIC HYPERTROPHY, WITH URINARY OBSTRUCTION 06/25/2007  . Cardiomegaly 05/30/2007  . CERVICAL DISORDER, NOS 10/14/2009  . CORONARY ARTERY DISEASE 12/03/2006  . DIVERTICULOSIS OF COLON 05/20/2001  . Dyshidrosis 06/25/2007  . GERD 12/06/2007  . HYPERLIPIDEMIA 12/03/2006  . HYPERTENSION 12/03/2006  . INTERNAL HEMORRHOIDS 05/20/2001  . OBESITY, MODERATE 12/05/2008  . OSTEOARTHRITIS 06/25/2007  . VENOUS INSUFFICIENCY 12/04/2006  . Carotid stenosis 01/29/2014  . Plantar fasciitis     Social History   Social History  . Marital Status: Married    Spouse Name: N/A  . Number of Children: N/A  . Years of Education: N/A   Occupational History  . Not on file.   Social History Main Topics  . Smoking status: Former Smoker    Quit date: 08/21/1980  . Smokeless tobacco: Never Used  . Alcohol Use: 12.6 oz/week    21 Standard drinks or equivalent per week     Comment: occ    . Drug Use: No  . Sexual Activity: Not on file   Other Topics Concern  . 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       Works part time for [employer].    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    In trucking business for a long time.    Past Surgical History  Procedure Laterality Date  . Foot surgery    . Tonsillectomy    . Joint replacement      bilat knee  . Angioplasty      2 stents    Family History  Problem Relation Age of Onset  . Colon cancer Mother 26    Allergies  Allergen Reactions  . Oxycodone-Aspirin Nausea And Vomiting    Current Outpatient Prescriptions on File Prior to Visit  Medication Sig Dispense Refill  . aspirin 81 MG tablet Take 81 mg by mouth daily.      Marland Kitchen atorvastatin (LIPITOR) 40 MG tablet TAKE 1 TABLET BY MOUTH DAILY 90 tablet 3  . Coenzyme Q10 (COQ10) 200 MG CAPS Take by mouth daily.    Marland Kitchen lisinopril (PRINIVIL,ZESTRIL) 40 MG tablet TAKE 1 TABLET BY MOUTH DAILY 100 tablet 0  . Multiple Vitamin (MULTIVITAMIN) tablet Take 1 tablet by  mouth daily.      . Multiple Vitamins-Minerals (PRESERVISION AREDS 2 PO) Take by mouth 2 (two) times daily.    . nitroGLYCERIN (NITROSTAT) 0.4 MG SL tablet Place 1 tablet (0.4 mg total) under the tongue every 5 (five) minutes as needed. 25 tablet 6  . Omega-3 Fatty Acids (FISH OIL) 1000 MG CAPS Take by mouth daily.      Marland Kitchen oxybutynin (DITROPAN) 5 MG tablet TAKE 1 TABLET BY MOUTH THREE TIMES DAILY 300 tablet 0   No current facility-administered medications on file prior to visit.    BP 116/64 mmHg  Pulse 64  Temp(Src) 97.5 F (36.4 C) (Oral)  Wt 257 lb (116.574 kg)    Objective:   Physical Exam  Constitutional: He is oriented to person, place, and time. He appears well-nourished.  Eyes: Pupils are equal, round, and reactive to light.  Cardiovascular: Normal rate, regular rhythm and normal heart sounds.   Pulmonary/Chest: Effort normal and  breath sounds normal.  Equal chest rise and fall. Tenderness to right 5th and 6th thoracic chest wall. No crepitus or obvious deformity.  Neurological: He is alert and oriented to person, place, and time.  Skin: Skin is warm and dry.  No injury apparent to skin. No erythema, open wounds, bruising.          Assessment & Plan:  Rib Pain:  Located to right lateral chest wall s/p fall yesterday. Did not hit head, no LOC. [place] forward on tractor trailer. Exam with tenderness to right 5th and 6th lateral chest wall. Exam otherwise unremarkable as described above. Will obtain xray of right lateral ribs. Do not suspect pneumothorax. RX for Tramadol provided to use PRN severe pain.  Discussed importance of deep breathing exercises.  Return precautions provided.

## 2015-06-18 NOTE — Telephone Encounter (Signed)
Per [place], patient has been notified.

## 2015-06-18 NOTE — Progress Notes (Signed)
Pre visit review using our clinic review tool, if applicable. No additional management support is needed unless otherwise documented below in the visit note. 

## 2015-06-24 DIAGNOSIS — H04561 Stenosis of right lacrimal punctum: Secondary | ICD-10-CM | POA: Diagnosis not present

## 2015-07-12 DIAGNOSIS — M3501 Sicca syndrome with keratoconjunctivitis: Secondary | ICD-10-CM | POA: Diagnosis not present

## 2015-07-19 ENCOUNTER — Other Ambulatory Visit (INDEPENDENT_AMBULATORY_CARE_PROVIDER_SITE_OTHER): Payer: Medicare Other

## 2015-07-19 DIAGNOSIS — Z79899 Other long term (current) drug therapy: Secondary | ICD-10-CM

## 2015-07-19 DIAGNOSIS — E785 Hyperlipidemia, unspecified: Secondary | ICD-10-CM

## 2015-07-19 LAB — CBC WITH DIFFERENTIAL/PLATELET
BASOS PCT: 0.3 % (ref 0.0–3.0)
Basophils Absolute: 0 10*3/uL (ref 0.0–0.1)
EOS ABS: 0.3 10*3/uL (ref 0.0–0.7)
Eosinophils Relative: 3.4 % (ref 0.0–5.0)
HCT: 43.5 % (ref 39.0–52.0)
Hemoglobin: 14.1 g/dL (ref 13.0–17.0)
Lymphocytes Relative: 33.4 % (ref 12.0–46.0)
Lymphs Abs: 2.5 10*3/uL (ref 0.7–4.0)
MCHC: 32.5 g/dL (ref 30.0–36.0)
MCV: 86.9 fl (ref 78.0–100.0)
MONO ABS: 1.1 10*3/uL — AB (ref 0.1–1.0)
Monocytes Relative: 15.3 % — ABNORMAL HIGH (ref 3.0–12.0)
NEUTROS ABS: 3.5 10*3/uL (ref 1.4–7.7)
Neutrophils Relative %: 47.6 % (ref 43.0–77.0)
PLATELETS: 159 10*3/uL (ref 150.0–400.0)
RBC: 5.01 Mil/uL (ref 4.22–5.81)
RDW: 15.1 % (ref 11.5–15.5)
WBC: 7.4 10*3/uL (ref 4.0–10.5)

## 2015-07-19 LAB — LIPID PANEL
CHOLESTEROL: 144 mg/dL (ref 0–200)
HDL: 50.3 mg/dL (ref >39.00)
LDL CALC: 77 mg/dL (ref 0–99)
NonHDL: 93.68
TRIGLYCERIDES: 84 mg/dL (ref 0.0–149.0)
Total CHOL/HDL Ratio: 3
VLDL: 16.8 mg/dL (ref 0.0–40.0)

## 2015-07-19 LAB — HEPATIC FUNCTION PANEL
ALBUMIN: 3.8 g/dL (ref 3.5–5.2)
ALK PHOS: 70 U/L (ref 39–117)
ALT: 21 U/L (ref 0–53)
AST: 19 U/L (ref 0–37)
Bilirubin, Direct: 0.1 mg/dL (ref 0.0–0.3)
TOTAL PROTEIN: 6.7 g/dL (ref 6.0–8.3)
Total Bilirubin: 0.5 mg/dL (ref 0.2–1.2)

## 2015-07-19 LAB — BASIC METABOLIC PANEL
BUN: 17 mg/dL (ref 6–23)
CO2: 27 mEq/L (ref 19–32)
Calcium: 9.6 mg/dL (ref 8.4–10.5)
Chloride: 106 mEq/L (ref 96–112)
Creatinine, Ser: 0.82 mg/dL (ref 0.40–1.50)
GFR: 96.83 mL/min (ref >60.00)
Glucose, Bld: 109 mg/dL — ABNORMAL HIGH (ref 70–99)
POTASSIUM: 4.8 meq/L (ref 3.5–5.1)
SODIUM: 138 meq/L (ref 135–145)

## 2015-07-26 ENCOUNTER — Ambulatory Visit (INDEPENDENT_AMBULATORY_CARE_PROVIDER_SITE_OTHER): Payer: Medicare Other | Admitting: Family Medicine

## 2015-07-26 ENCOUNTER — Encounter: Payer: Self-pay | Admitting: Family Medicine

## 2015-07-26 VITALS — BP 128/80 | HR 68 | Temp 97.6°F | Ht 66.5 in | Wt 256.2 lb

## 2015-07-26 DIAGNOSIS — Z0000 Encounter for general adult medical examination without abnormal findings: Secondary | ICD-10-CM

## 2015-07-26 DIAGNOSIS — L570 Actinic keratosis: Secondary | ICD-10-CM

## 2015-07-26 DIAGNOSIS — L989 Disorder of the skin and subcutaneous tissue, unspecified: Secondary | ICD-10-CM

## 2015-07-26 MED ORDER — ATORVASTATIN CALCIUM 40 MG PO TABS: 40.0000 mg | ORAL_TABLET | Freq: Every day | ORAL | Status: DC

## 2015-07-26 MED ORDER — LISINOPRIL 40 MG PO TABS: 40.0000 mg | ORAL_TABLET | Freq: Every day | ORAL | Status: DC

## 2015-07-26 MED ORDER — SOLIFENACIN SUCCINATE 10 MG PO TABS: 10.0000 mg | ORAL_TABLET | Freq: Every day | ORAL | Status: DC

## 2015-07-26 NOTE — Progress Notes (Signed)
Dr. Frederico Hamman T. Damariz Paganelli, MD, [facility] [place], [address] Phone: [phone] Fax: [phone]  07/26/2015  Patient: Hunter Young, MRN: [id number], DOB: 04/23/1940, 75 y.o.  Primary Physician:  Owens Loffler, MD   Chief Complaint  Patient presents with  . Annual Exam    Medicare Wellness   Subjective:   Hunter Young is a 75 y.o. pleasant patient who presents for a medicare wellness examination:  Preventative Health Maintenance Visit:  Health Maintenance Summary Reviewed and updated, unless pt declines services.  Tobacco History Reviewed. Alcohol: No concerns, no excessive use Exercise Habits: Some activity, rec at least 30 mins 5 times a week STD concerns: no risk or activity to increase risk Drug Use: None Encouraged self-testicular check  R rib fx about 5 weeks ago.   Increased frequency with the bladder for many years.  Taking some ditropan.   Getting up 2-3 times at night.   Switch [facility].  Set up with Dr. Roosvelt Harps group.  Male MD   Health Maintenance  Topic Date Due  . ZOSTAVAX  07/25/2016 (Originally 07/15/1998)  . INFLUENZA VACCINE  10/26/2015  . COLONOSCOPY  08/26/2017  . TETANUS/TDAP  07/23/2018  . PNA vac Low Risk Adult  Completed    Immunization History  Administered Date(s) Administered  . Hepatitis B 07/22/2008  . Influenza Split 01/19/2011, 12/21/2011  . Influenza Whole 01/10/2007, 12/25/2008  . Influenza,inj,Quad PF,36+ Mos 12/16/2012, 01/28/2014, 02/11/2015  . Pneumococcal Conjugate-13 01/28/2014  . Pneumococcal Polysaccharide-23 03/27/2004, 01/19/2011  . Td 03/27/1998, 07/22/2008    Patient Active Problem List   Diagnosis Date Noted  . Stenosis of right carotid artery 01/29/2014  . Severe obesity (BMI >= 40) ([place]) 04/02/2013  . CERVICAL DISORDER, NOS 10/14/2009  . GERD 12/06/2007  . BPH (benign prostatic hypertrophy) with urinary obstruction 06/25/2007  .  OSTEOARTHRITIS 06/25/2007  . CARDIOMEGALY 05/30/2007  . Allergic rhinitis 04/22/2007  . VENOUS INSUFFICIENCY 12/04/2006  . Hyperlipidemia LDL goal <70 12/03/2006  . Essential hypertension 12/03/2006  . Coronary artery disease involving native coronary artery without angina pectoris 12/03/2006  . DIVERTICULOSIS OF COLON 05/20/2001   Past Medical History  Diagnosis Date  . ALLERGIC RHINITIS 04/22/2007  . ASTHMA, INTRINSIC NOS 12/27/2006  . BENIGN PROSTATIC HYPERTROPHY, WITH URINARY OBSTRUCTION 06/25/2007  . Cardiomegaly 05/30/2007  . CERVICAL DISORDER, NOS 10/14/2009  . CORONARY ARTERY DISEASE 12/03/2006  . DIVERTICULOSIS OF COLON 05/20/2001  . Dyshidrosis 06/25/2007  . GERD 12/06/2007  . HYPERLIPIDEMIA 12/03/2006  . HYPERTENSION 12/03/2006  . INTERNAL HEMORRHOIDS 05/20/2001  . OBESITY, MODERATE 12/05/2008  . OSTEOARTHRITIS 06/25/2007  . VENOUS INSUFFICIENCY 12/04/2006  . Carotid stenosis 01/29/2014  . Plantar fasciitis    Past Surgical History  Procedure Laterality Date  . Foot surgery    . Tonsillectomy    . Joint replacement      bilat knee  . Angioplasty      2 stents   Social History   Social History  . Marital Status: Married    Spouse Name: N/A  . Number of Children: N/A  . Years of Education: N/A   Occupational History  . Not on file.   Social History Main Topics  . Smoking status: Former Smoker    Quit date: 08/21/1980  . Smokeless tobacco: Never Used  . Alcohol Use: 12.6 oz/week    21 Standard drinks or equivalent per week     Comment: occ  . Drug Use: No  .  Sexual Activity: Not on file   Other Topics Concern  . 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       Works part time for [employer].    Runs hub to [place], [place]. 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    In trucking business for a long time.   Family History  Problem Relation Age of Onset  . Colon cancer Mother 77   Allergies  Allergen Reactions  .  Oxycodone-Aspirin Nausea And Vomiting    Medication list has been reviewed and updated.   General: Denies fever, chills, sweats. No significant weight loss. Eyes: Denies blurring,significant itching ENT: Denies earache, sore throat, and hoarseness. Cardiovascular: Denies chest pains, palpitations, dyspnea on exertion Respiratory: Denies cough, dyspnea at rest,wheeezing Breast: no concerns about lumps GI: Denies nausea, vomiting, diarrhea, constipation, change in bowel habits, abdominal pain, melena, hematochezia GU: Denies penile discharge, ED, urinary flow / outflow problems. No STD concerns. Musculoskeletal: RECENT RIB FIX Derm: MULTIPLE CRUSTY ? AK ON HIS HEAD AND FACE Neuro: Denies  paresthesias, frequent falls, frequent headaches Psych: Denies depression, anxiety Endocrine: Denies cold intolerance, heat intolerance, polydipsia Heme: Denies enlarged lymph nodes Allergy: No hayfever  Objective:   BP 128/80 mmHg  Pulse 68  Temp(Src) 97.6 F (36.4 C) (Oral)  Ht 5' 6.5" (1.689 m)  Wt 256 lb 4 oz (116.234 kg)  BMI 40.74 kg/m2  The patient completed a fall screen and PHQ-2 and PHQ-9 if necessary, which is documented in the EHR. The CMA/LPN/RN who assisted the patient verbally completed with them and documented results in [facility].   Hearing Screening   Method: Audiometry   125Hz  250Hz  500Hz  1000Hz  2000Hz  4000Hz  8000Hz   Right ear:   40 20 40 0   Left ear:   25 20 0 0   Comments: [facility] Exam [facility] with Dr. George Ina 07/12/2015   GEN: well developed, well nourished, no acute distress Eyes: conjunctiva and lids normal, PERRLA, EOMI ENT: TM clear, nares clear, oral exam WNL Neck: supple, no lymphadenopathy, no thyromegaly, no JVD Pulm: clear to auscultation and percussion, respiratory effort normal CV: regular rate and rhythm, S1-S2, no murmur, rub or gallop, no bruits, peripheral pulses normal and symmetric, no cyanosis, clubbing, edema or varicosities GI:  soft, non-tender; no hepatosplenomegaly, masses; active bowel sounds all quadrants GU: no hernia, testicular mass, penile discharge Lymph: no cervical, axillary or inguinal adenopathy MSK: gait normal, muscle tone and strength WNL, no joint swelling, effusions, discoloration, crepitus  SKIN: MULTIPLE AREAS, LIGHTLY CRUSTED, FACE AND HEAD Neuro: normal mental status, normal strength, sensation, and motion Psych: alert; oriented to person, place and time, normally interactive and not anxious or depressed in appearance.  All labs reviewed with patient.  Lipids:    Component Value Date/Time   CHOL 144 07/19/2015 0823   CHOL 161 06/11/2014 0759   TRIG 84.0 07/19/2015 0823   HDL 50.30 07/19/2015 0823   HDL 68 06/11/2014 0759   VLDL 16.8 07/19/2015 0823   CHOLHDL 3 07/19/2015 0823   CHOLHDL 2.4 06/11/2014 0759   CBC: CBC Latest Ref Rng 07/19/2015 01/26/2014 01/11/2011  WBC 4.0 - 10.5 K/uL 7.4 7.9 7.2  Hemoglobin 13.0 - 17.0 g/dL 14.1 14.6 13.9  Hematocrit 39.0 - 52.0 % 43.5 45.9 43.1  Platelets 150.0 - 400.0 K/uL 159.0 151.0 149.0(L)    Basic Metabolic Panel:    Component Value Date/Time   NA 138  07/19/2015 0823   K 4.8 07/19/2015 0823   CL 106 07/19/2015 0823   CO2 27 07/19/2015 0823   BUN 17 07/19/2015 0823   CREATININE 0.82 07/19/2015 0823   GLUCOSE 109* 07/19/2015 0823   CALCIUM 9.6 07/19/2015 0823   Hepatic Function Latest Ref Rng 07/19/2015 01/26/2014 03/06/2012  Total Protein 6.0 - 8.3 g/dL 6.7 6.4 7.2  Albumin 3.5 - 5.2 g/dL 3.8 3.1(L) 3.9  AST 0 - 37 U/L 19 26 25   ALT 0 - 53 U/L 21 31 23   Alk Phosphatase 39 - 117 U/L 70 55 62  Total Bilirubin 0.2 - 1.2 mg/dL 0.5 0.7 1.0  Bilirubin, Direct 0.0 - 0.3 mg/dL 0.1 0.1 0.1    Lab Results  Component Value Date   TSH 0.93 01/11/2011   Lab Results  Component Value Date   PSA 0.37 01/26/2014   PSA 0.35 01/11/2011   PSA 0.29 07/22/2009    Assessment and Plan:   Healthcare maintenance  AK (actinic keratosis) -  Plan: Ambulatory referral to Dermatology  Skin lesion - Plan: Ambulatory referral to Dermatology  Health Maintenance Exam: The patient's preventative maintenance and recommended screening tests for an annual wellness exam were reviewed in full today. Brought up to date unless services declined.  Counselled on the importance of diet, exercise, and its role in overall health and mortality. The patient's FH and SH was reviewed, including their home life, tobacco status, and drug and alcohol status.  I have personally reviewed the Medicare Annual Wellness questionnaire and have noted 1. The patient's medical and social history 2. Their use of alcohol, tobacco or illicit drugs 3. Their current medications and supplements 4. The patient's functional ability including ADL's, fall risks, home safety risks and hearing or visual             impairment. 5. Diet and physical activities 6. Evidence for depression or mood disorders 7. Reviewed Updated provider list, see scanned forms and [facility] Snapshot.   The patients weight, height, BMI and visual acuity have been recorded in the chart I have made referrals, counseling and provided education to the patient based review of the above and I have provided the pt with a written personalized care plan for preventive services.  I have provided the patient with a copy of your personalized plan for preventive services. Instructed to take the time to review along with their updated medication list.  Change to vesicare  Follow-up: No Follow-up on file. Or follow-up in 1 year for complete physical examination  New Prescriptions   SOLIFENACIN (VESICARE) 10 MG TABLET    Take 1 tablet (10 mg total) by mouth daily.   Modified Medications   Modified Medication Previous Medication   ATORVASTATIN (LIPITOR) 40 MG TABLET atorvastatin (LIPITOR) 40 MG tablet      Take 1 tablet (40 mg total) by mouth daily.    TAKE 1 TABLET BY MOUTH DAILY   LISINOPRIL  (PRINIVIL,ZESTRIL) 40 MG TABLET lisinopril (PRINIVIL,ZESTRIL) 40 MG tablet      Take 1 tablet (40 mg total) by mouth daily.    TAKE 1 TABLET BY MOUTH DAILY   Orders Placed This Encounter  Procedures  . Ambulatory referral to Dermatology    Signed,  Frederico Hamman T. Brookelynn Hamor, MD   Patient's Medications  New Prescriptions   SOLIFENACIN (VESICARE) 10 MG TABLET    Take 1 tablet (10 mg total) by mouth daily.  Previous Medications   ASPIRIN 81 MG TABLET    Take 81 mg  by mouth daily.     COENZYME Q10 (COQ10) 200 MG CAPS    Take by mouth daily.   MULTIPLE VITAMIN (MULTIVITAMIN) TABLET    Take 1 tablet by mouth daily.     MULTIPLE VITAMINS-MINERALS (PRESERVISION AREDS 2 PO)    Take by mouth 2 (two) times daily.   NITROGLYCERIN (NITROSTAT) 0.4 MG SL TABLET    Place 1 tablet (0.4 mg total) under the tongue every 5 (five) minutes as needed.   OMEGA-3 FATTY ACIDS (FISH OIL) 1000 MG CAPS    Take by mouth daily.     TRAMADOL (ULTRAM) 50 MG TABLET    Take 1-2 tablets (50-100 mg total) by mouth every 8 (eight) hours as needed for moderate pain or severe pain.  Modified Medications   Modified Medication Previous Medication   ATORVASTATIN (LIPITOR) 40 MG TABLET atorvastatin (LIPITOR) 40 MG tablet      Take 1 tablet (40 mg total) by mouth daily.    TAKE 1 TABLET BY MOUTH DAILY   LISINOPRIL (PRINIVIL,ZESTRIL) 40 MG TABLET lisinopril (PRINIVIL,ZESTRIL) 40 MG tablet      Take 1 tablet (40 mg total) by mouth daily.    TAKE 1 TABLET BY MOUTH DAILY  Discontinued Medications   OXYBUTYNIN (DITROPAN) 5 MG TABLET    TAKE 1 TABLET BY MOUTH THREE TIMES DAILY

## 2015-07-26 NOTE — Progress Notes (Signed)
Pre visit review using our clinic review tool, if applicable. No additional management support is needed unless otherwise documented below in the visit note. 

## 2015-09-15 DIAGNOSIS — L570 Actinic keratosis: Secondary | ICD-10-CM | POA: Diagnosis not present

## 2015-11-25 ENCOUNTER — Ambulatory Visit (INDEPENDENT_AMBULATORY_CARE_PROVIDER_SITE_OTHER): Payer: Medicare Other | Admitting: Family Medicine

## 2015-11-25 ENCOUNTER — Encounter: Payer: Self-pay | Admitting: Family Medicine

## 2015-11-25 VITALS — BP 128/70 | HR 90 | Temp 97.7°F | Ht 66.5 in | Wt 261.2 lb

## 2015-11-25 DIAGNOSIS — R3915 Urgency of urination: Secondary | ICD-10-CM

## 2015-11-25 DIAGNOSIS — Z23 Encounter for immunization: Secondary | ICD-10-CM | POA: Diagnosis not present

## 2015-11-25 DIAGNOSIS — R42 Dizziness and giddiness: Secondary | ICD-10-CM

## 2015-11-25 MED ORDER — DIAZEPAM 2 MG PO TABS: 2.0000 mg | ORAL_TABLET | Freq: Three times a day (TID) | ORAL | 0 refills | Status: DC | PRN

## 2015-11-25 MED ORDER — OXYBUTYNIN CHLORIDE 5 MG PO TABS: 5.0000 mg | ORAL_TABLET | Freq: Three times a day (TID) | ORAL | 0 refills | Status: DC

## 2015-11-25 MED ORDER — MECLIZINE HCL 25 MG PO TABS: 25.0000 mg | ORAL_TABLET | Freq: Three times a day (TID) | ORAL | 0 refills | Status: DC | PRN

## 2015-11-25 NOTE — Progress Notes (Signed)
Dr. Frederico Hamman T. Dhara Schepp, MD, [facility] [place], [address] Phone: [phone] Fax: [phone]  11/25/2015  Patient: Hunter Young, MRN: [id number], DOB: Oct 20, 1940, 75 y.o.  Primary Physician:  Owens Loffler, MD   Chief Complaint  Patient presents with  . Dizziness   Subjective:   Hunter Young is a 75 y.o. very pleasant male patient who presents with the following:  A little less than a week - did not have anything.  Inner ear has gotten worse and tinnitus.  Return of true vertigo  Ongoing urgency - feels ditropan about the same as vesicare and much cheaper  Past Medical History, Surgical History, Social History, Family History, Problem List, Medications, and Allergies have been reviewed and updated if relevant.  Patient Active Problem List   Diagnosis Date Noted  . Stenosis of right carotid artery 01/29/2014  . Severe obesity (BMI >= 40) ([place]) 04/02/2013  . CERVICAL DISORDER, NOS 10/14/2009  . GERD 12/06/2007  . BPH (benign prostatic hypertrophy) with urinary obstruction 06/25/2007  . OSTEOARTHRITIS 06/25/2007  . CARDIOMEGALY 05/30/2007  . Allergic rhinitis 04/22/2007  . VENOUS INSUFFICIENCY 12/04/2006  . Hyperlipidemia LDL goal <70 12/03/2006  . Essential hypertension 12/03/2006  . Coronary artery disease involving native coronary artery without angina pectoris 12/03/2006  . DIVERTICULOSIS OF COLON 05/20/2001    Past Medical History:  Diagnosis Date  . ALLERGIC RHINITIS 04/22/2007  . ASTHMA, INTRINSIC NOS 12/27/2006  . BENIGN PROSTATIC HYPERTROPHY, WITH URINARY OBSTRUCTION 06/25/2007  . Cardiomegaly 05/30/2007  . Carotid stenosis 01/29/2014  . CERVICAL DISORDER, NOS 10/14/2009  . CORONARY ARTERY DISEASE 12/03/2006  . DIVERTICULOSIS OF COLON 05/20/2001  . Dyshidrosis 06/25/2007  . GERD 12/06/2007  . HYPERLIPIDEMIA 12/03/2006  . HYPERTENSION 12/03/2006  . INTERNAL HEMORRHOIDS 05/20/2001  . OBESITY, MODERATE  12/05/2008  . OSTEOARTHRITIS 06/25/2007  . Plantar fasciitis   . VENOUS INSUFFICIENCY 12/04/2006    Past Surgical History:  Procedure Laterality Date  . ANGIOPLASTY     2 stents  . FOOT SURGERY    . JOINT REPLACEMENT     bilat knee  . TONSILLECTOMY      Social History   Social History  . Marital status: Married    Spouse name: N/A  . Number of children: N/A  . Years of education: N/A   Occupational History  . Not on file.   Social History Main Topics  . Smoking status: Former Smoker    Quit date: 08/21/1980  . Smokeless tobacco: Never Used  . Alcohol use 12.6 oz/week    21 Standard drinks or equivalent per week     Comment: occ  . Drug use: No  . Sexual activity: Not on file   Other Topics Concern  . 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       Works part time for [employer].    Runs hub to [place], [place]. 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    In trucking business for a long time.    Family History  Problem Relation Age of Onset  . Colon cancer Mother 45    Allergies  Allergen Reactions  . Oxycodone-Aspirin Nausea And Vomiting    Medication list reviewed and updated in full in [facility].  ROS: GEN: Acute illness details above GI: Tolerating PO intake GU: maintaining adequate hydration and urination Pulm: No SOB Interactive and getting  along well at home.  Otherwise, ROS is as per the HPI.  Objective:   BP 128/70   Pulse 90   Temp 97.7 F (36.5 C) (Oral)   Ht 5' 6.5" (1.689 m)   Wt 261 lb 4 oz (118.5 kg)   BMI 41.54 kg/m    GEN: WDWN, NAD, Non-toxic, A & O x 3 HEENT: Atraumatic, Normocephalic. Neck supple. No masses, No LAD. Ears and Nose: No external deformity. Induction of vertigo with head movement.  CV: RRR, No M/G/R. No JVD. No thrill. No extra heart sounds. PULM: CTA B, no wheezes, crackles, rhonchi. No retractions. No resp. distress. No accessory muscle use. EXTR: No c/c/e NEURO  Normal gait.  PSYCH: Normally interactive. Conversant. Not depressed or anxious appearing.  Calm demeanor.     Laboratory and Imaging Data:  Assessment and Plan:   Vertigo  Need for prophylactic vaccination and inoculation against influenza - Plan: Flu Vaccine QUAD 36+ mos IM  Urinary urgency   Acute vertigo treatment  Change to ditropan  Follow-up: No Follow-up on file.  New Prescriptions   DIAZEPAM (VALIUM) 2 MG TABLET    Take 1 tablet (2 mg total) by mouth every 8 (eight) hours as needed (vertigo).   MECLIZINE (ANTIVERT) 25 MG TABLET    Take 1 tablet (25 mg total) by mouth 3 (three) times daily as needed for dizziness.   OXYBUTYNIN (DITROPAN) 5 MG TABLET    Take 1 tablet (5 mg total) by mouth 3 (three) times daily.   Modified Medications   No medications on file   Orders Placed This Encounter  Procedures  . Flu Vaccine QUAD 36+ mos IM    Signed,  Allecia Bells T. Tierney Behl, MD   Patient's Medications  New Prescriptions   DIAZEPAM (VALIUM) 2 MG TABLET    Take 1 tablet (2 mg total) by mouth every 8 (eight) hours as needed (vertigo).   MECLIZINE (ANTIVERT) 25 MG TABLET    Take 1 tablet (25 mg total) by mouth 3 (three) times daily as needed for dizziness.   OXYBUTYNIN (DITROPAN) 5 MG TABLET    Take 1 tablet (5 mg total) by mouth 3 (three) times daily.  Previous Medications   ASPIRIN 81 MG TABLET    Take 81 mg by mouth daily.     ATORVASTATIN (LIPITOR) 40 MG TABLET    Take 1 tablet (40 mg total) by mouth daily.   LISINOPRIL (PRINIVIL,ZESTRIL) 40 MG TABLET    Take 1 tablet (40 mg total) by mouth daily.   MULTIPLE VITAMIN (MULTIVITAMIN) TABLET    Take 1 tablet by mouth daily.     MULTIPLE VITAMINS-MINERALS (PRESERVISION AREDS 2 PO)    Take by mouth 2 (two) times daily.   NITROGLYCERIN (NITROSTAT) 0.4 MG SL TABLET    Place 1 tablet (0.4 mg total) under the tongue every 5 (five) minutes as needed.  Modified Medications   No medications on file  Discontinued Medications    COENZYME Q10 (COQ10) 200 MG CAPS    Take by mouth daily.   OMEGA-3 FATTY ACIDS (FISH OIL) 1000 MG CAPS    Take by mouth daily.     SOLIFENACIN (VESICARE) 10 MG TABLET    Take 1 tablet (10 mg total) by mouth daily.   TRAMADOL (ULTRAM) 50 MG TABLET    Take 1-2 tablets (50-100 mg total) by mouth every 8 (eight) hours as needed for moderate pain or severe pain.

## 2015-11-25 NOTE — Progress Notes (Signed)
Pre visit review using our clinic review tool, if applicable. No additional management support is needed unless otherwise documented below in the visit note. 

## 2016-03-03 DIAGNOSIS — M25571 Pain in right ankle and joints of right foot: Secondary | ICD-10-CM | POA: Diagnosis not present

## 2016-03-14 ENCOUNTER — Other Ambulatory Visit: Payer: Self-pay | Admitting: Family Medicine

## 2016-04-19 ENCOUNTER — Ambulatory Visit (INDEPENDENT_AMBULATORY_CARE_PROVIDER_SITE_OTHER): Payer: Medicare Other | Admitting: Family Medicine

## 2016-04-19 ENCOUNTER — Encounter: Payer: Self-pay | Admitting: Family Medicine

## 2016-04-19 VITALS — BP 110/70 | HR 52 | Temp 98.2°F | Ht 66.5 in | Wt 256.2 lb

## 2016-04-19 DIAGNOSIS — R42 Dizziness and giddiness: Secondary | ICD-10-CM | POA: Diagnosis not present

## 2016-04-19 MED ORDER — MECLIZINE HCL 25 MG PO TABS: 25.0000 mg | ORAL_TABLET | Freq: Three times a day (TID) | ORAL | 2 refills | Status: DC | PRN

## 2016-04-19 NOTE — Progress Notes (Signed)
Pre visit review using our clinic review tool, if applicable. No additional management support is needed unless otherwise documented below in the visit note. 

## 2016-04-19 NOTE — Progress Notes (Signed)
Dr. Frederico Hamman T. Kramer Hanrahan, MD, [facility] [place], [address] Phone: [phone] Fax: [phone]  04/19/2016  Patient: Hunter Young, MRN: [id number], DOB: Dec 27, 1940, 76 y.o.  Primary Physician:  Owens Loffler, MD   Chief Complaint  Patient presents with  . Dizziness    X 2 WEEKS   Subjective:   Hunter Young is a 76 y.o. very pleasant male patient who presents with the following:  Two weeks, felt like he was gettinng some vertigo - feel like it cannot go away, feels like he will get vertigo. Has been taking some low dose valium. It feels like it does when he is had prior episodes of vertigo.  Previous episodes of vertigo have  Been more short lived.  He denies any other neurological complaints, no  Change in mentation, strength, sensory changes.  Sometimes with walking will feel a little funny.    Past Medical History, Surgical History, Social History, Family History, Problem List, Medications, and Allergies have been reviewed and updated if relevant.  Patient Active Problem List   Diagnosis Date Noted  . Stenosis of right carotid artery 01/29/2014  . Severe obesity (BMI >= 40) ([place]) 04/02/2013  . CERVICAL DISORDER, NOS 10/14/2009  . GERD 12/06/2007  . BPH (benign prostatic hypertrophy) with urinary obstruction 06/25/2007  . OSTEOARTHRITIS 06/25/2007  . CARDIOMEGALY 05/30/2007  . Allergic rhinitis 04/22/2007  . VENOUS INSUFFICIENCY 12/04/2006  . Hyperlipidemia LDL goal <70 12/03/2006  . Essential hypertension 12/03/2006  . Coronary artery disease involving native coronary artery without angina pectoris 12/03/2006  . DIVERTICULOSIS OF COLON 05/20/2001    Past Medical History:  Diagnosis Date  . ALLERGIC RHINITIS 04/22/2007  . ASTHMA, INTRINSIC NOS 12/27/2006  . BENIGN PROSTATIC HYPERTROPHY, WITH URINARY OBSTRUCTION 06/25/2007  . Cardiomegaly 05/30/2007  . Carotid stenosis 01/29/2014  . CERVICAL DISORDER, NOS  10/14/2009  . CORONARY ARTERY DISEASE 12/03/2006  . DIVERTICULOSIS OF COLON 05/20/2001  . Dyshidrosis 06/25/2007  . GERD 12/06/2007  . HYPERLIPIDEMIA 12/03/2006  . HYPERTENSION 12/03/2006  . INTERNAL HEMORRHOIDS 05/20/2001  . OBESITY, MODERATE 12/05/2008  . OSTEOARTHRITIS 06/25/2007  . Plantar fasciitis   . VENOUS INSUFFICIENCY 12/04/2006    Past Surgical History:  Procedure Laterality Date  . ANGIOPLASTY     2 stents  . FOOT SURGERY    . JOINT REPLACEMENT     bilat knee  . TONSILLECTOMY      Social History   Social History  . Marital status: Married    Spouse name: N/A  . Number of children: N/A  . Years of education: N/A   Occupational History  . Not on file.   Social History Main Topics  . Smoking status: Former Smoker    Quit date: 08/21/1980  . Smokeless tobacco: Never Used  . Alcohol use 12.6 oz/week    21 Standard drinks or equivalent per week     Comment: occ  . Drug use: No  . Sexual activity: Not on file   Other Topics Concern  . 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       Works part time for [employer].    Runs hub to [place], [place]. 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    In trucking business for a long time.    Family History  Problem Relation Age of Onset  . Colon cancer Mother 20  Allergies  Allergen Reactions  . Oxycodone-Aspirin Nausea And Vomiting    Medication list reviewed and updated in full in [facility].   GEN: No acute illnesses, no fevers, chills. GI: No n/v/d, eating normally Pulm: No SOB Interactive and getting along well at home.  Otherwise, ROS is as per the HPI.  Objective:   BP 110/70   Pulse (!) 52   Temp 98.2 F (36.8 C) (Oral)   Ht 5' 6.5" (1.689 m)   Wt 256 lb 4 oz (116.2 kg)   BMI 40.74 kg/m   GEN: WDWN, NAD, Non-toxic, A & O x 3 HEENT: Atraumatic, Normocephalic. Neck supple. No masses, No LAD. Ears and Nose: No external deformity. He gets dizzy and has  vertigo almost immediately when his head  Is  rotated CV: RRR, No M/G/R. No JVD. No thrill. No extra heart sounds. PULM: CTA B, no wheezes, crackles, rhonchi. No retractions. No resp. distress. No accessory muscle use. EXTR: No c/c/e NEURO Normal gait.  PSYCH: Normally interactive. Conversant. Not depressed or anxious appearing.  Calm demeanor.   Laboratory and Imaging Data:  Assessment and Plan:   Vertigo  I reassured him that this is almost certainly from his in her ear.  Intently scheduled meclizine for now.  If symptoms still persist and 2 weeks then I think having him do some vestibular rehabilitation versus ENT would be reasonable.  Follow-up: No Follow-up on file.  Meds ordered this encounter  Medications  . meclizine (ANTIVERT) 25 MG tablet    Sig: Take 1 tablet (25 mg total) by mouth 3 (three) times daily as needed for dizziness.    Dispense:  30 tablet    Refill:  2   Medications Discontinued During This Encounter  Medication Reason  . meclizine (ANTIVERT) 25 MG tablet Reorder   No orders of the defined types were placed in this encounter.   Signed,  Maud Deed. Tu Shimmel, MD   Allergies as of 04/19/2016      Reactions   Oxycodone-aspirin Nausea And Vomiting      Medication List       Accurate as of 04/19/16 11:59 PM. Always use your most recent med list.          aspirin 81 MG tablet Take 81 mg by mouth daily.   atorvastatin 40 MG tablet Commonly known as:  LIPITOR Take 1 tablet (40 mg total) by mouth daily.   diazepam 2 MG tablet Commonly known as:  VALIUM Take 1 tablet (2 mg total) by mouth every 8 (eight) hours as needed (vertigo).   lisinopril 40 MG tablet Commonly known as:  PRINIVIL,ZESTRIL Take 1 tablet (40 mg total) by mouth daily.   meclizine 25 MG tablet Commonly known as:  ANTIVERT Take 1 tablet (25 mg total) by mouth 3 (three) times daily as needed for dizziness.   multivitamin tablet Take 1 tablet by mouth daily.     nitroGLYCERIN 0.4 MG SL tablet Commonly known as:  NITROSTAT Place 1 tablet (0.4 mg total) under the tongue every 5 (five) minutes as needed.   oxybutynin 5 MG tablet Commonly known as:  DITROPAN TAKE 1 TABLET(5 MG) BY MOUTH THREE TIMES DAILY   PRESERVISION AREDS 2 PO Take by mouth 2 (two) times daily.

## 2016-06-04 NOTE — Progress Notes (Signed)
Cardiology Office Note  Date:  06/05/2016   ID:  Hunter Young, DOB 11-10-1940, MRN [id number]  PCP:  Owens Loffler, MD   Chief Complaint  Patient presents with  . OTHER    1 yr f/u c/o left shoulder pain. Meds reviewed verbally with pt.    HPI:  [name] is a very pleasant 76 year old gentleman with a history of stenting to his left circumflex, last catheterization in 2007 showing 40% proximal LAD disease, 70-80% mid RCA disease, nondominant vessel. He presents for routine followup of his coronary artery disease. 40 to 59% right carotid ultrasound in 2014  In follow-up today, he reports that he is doing well.  No regular exercise program, weight continues to be an issue Denies having significant leg swelling Denies any chest pain concerning for angina  Lab work reviewed with him in detail  Tolerating Lipitor 40 mg daily, cholesterol down to 140  Metoprolol was stopped in the past secondary to bradycardia. No smoking history, no significant diabetes, glucose level borderline  EKG shows normal sinus rhythm with rate 65 bpm, APCs , nonspecific T-wave abnormality  PMH:   has a past medical history of ALLERGIC RHINITIS (04/22/2007); ASTHMA, INTRINSIC NOS (12/27/2006); BENIGN PROSTATIC HYPERTROPHY, WITH URINARY OBSTRUCTION (06/25/2007); Cardiomegaly (05/30/2007); Carotid stenosis (01/29/2014); CERVICAL DISORDER, NOS (10/14/2009); CORONARY ARTERY DISEASE (12/03/2006); DIVERTICULOSIS OF COLON (05/20/2001); Dyshidrosis (06/25/2007); GERD (12/06/2007); HYPERLIPIDEMIA (12/03/2006); HYPERTENSION (12/03/2006); INTERNAL HEMORRHOIDS (05/20/2001); OBESITY, MODERATE (12/05/2008); OSTEOARTHRITIS (06/25/2007); Plantar fasciitis; and VENOUS INSUFFICIENCY (12/04/2006).  PSH:    Past Surgical History:  Procedure Laterality Date  . ANGIOPLASTY     2 stents  . FOOT SURGERY    . JOINT REPLACEMENT     bilat knee  . TONSILLECTOMY      Current Outpatient Prescriptions  Medication Sig Dispense Refill  . aspirin 81 MG  tablet Take 81 mg by mouth daily.      Marland Kitchen atorvastatin (LIPITOR) 40 MG tablet Take 1 tablet (40 mg total) by mouth daily. 93 tablet 3  . Coenzyme Q10 (CO Q-10) 200 MG CAPS Take by mouth daily.    Marland Kitchen lisinopril (PRINIVIL,ZESTRIL) 40 MG tablet Take 1 tablet (40 mg total) by mouth daily. 93 tablet 3  . meclizine (ANTIVERT) 25 MG tablet Take 1 tablet (25 mg total) by mouth 3 (three) times daily as needed for dizziness. 30 tablet 2  . Multiple Vitamin (MULTIVITAMIN) tablet Take 1 tablet by mouth daily.      . Multiple Vitamins-Minerals (PRESERVISION AREDS 2 PO) Take by mouth 2 (two) times daily.    . nitroGLYCERIN (NITROSTAT) 0.4 MG SL tablet Place 1 tablet (0.4 mg total) under the tongue every 5 (five) minutes as needed. 25 tablet 6  . oxybutynin (DITROPAN) 5 MG tablet TAKE 1 TABLET(5 MG) BY MOUTH THREE TIMES DAILY 270 tablet 1   No current facility-administered medications for this visit.      Allergies:   Oxycodone-aspirin   Social History:  The patient  reports that he quit smoking about 35 years ago. He has never used smokeless tobacco. He reports that he drinks about 12.6 oz of alcohol per week . He reports that he does not use drugs.   Family History:   family history includes Colon cancer (age of onset: 43) in his mother.    Review of Systems: Review of Systems  Constitutional: Negative.   Respiratory: Negative.   Cardiovascular: Negative.   Gastrointestinal: Negative.   Musculoskeletal: Negative.   Neurological: Negative.   Psychiatric/Behavioral: Negative.   All  other systems reviewed and are negative.    PHYSICAL EXAM: VS:  BP 108/70 (BP Location: Left Arm, Patient Position: Sitting, Cuff Size: Large)   Pulse 65   Ht 5' 8.5" (1.74 m)   Wt 257 lb 4 oz (116.7 kg)   BMI 38.55 kg/m  , BMI Body mass index is 38.55 kg/m. GEN: Well nourished, well developed, in no acute distress  HEENT: normal  Neck: no JVD, carotid bruits, or masses Cardiac: RRR; no murmurs, rubs, or  gallops,no edema  Respiratory:  clear to auscultation bilaterally, normal work of breathing GI: soft, nontender, nondistended, + BS MS: no deformity or atrophy  Skin: warm and dry, no rash Neuro:  Strength and sensation are intact Psych: euthymic mood, full affect    Recent Labs: 07/19/2015: ALT 21; BUN 17; Creatinine, Ser 0.82; Hemoglobin 14.1; Platelets 159.0; Potassium 4.8; Sodium 138    Lipid Panel Lab Results  Component Value Date   CHOL 144 07/19/2015   HDL 50.30 07/19/2015   LDLCALC 77 07/19/2015   TRIG 84.0 07/19/2015      Wt Readings from Last 3 Encounters:  06/05/16 257 lb 4 oz (116.7 kg)  04/19/16 256 lb 4 oz (116.2 kg)  11/25/15 261 lb 4 oz (118.5 kg)       ASSESSMENT AND PLAN:  Hyperlipidemia LDL goal <70 - Plan: EKG 12-Lead Cholesterol is at goal on the current lipid regimen. No changes to the medications were made.  Essential hypertension - Plan: EKG 12-Lead Blood pressure is well controlled on today's visit. No changes made to the medications.  Coronary artery disease involving native coronary artery of native heart without angina pectoris - Plan: EKG 12-Lead Currently  with no symptoms of angina. No further workup at this time. Continue current medication regimen.  Stenosis of right carotid artery - Plan: EKG 12-Lead 40 to 50% stenosis on the right October 2014 Results discussed with him in detail We will recommend repeat carotid ultrasound. Order placed  Severe obesity (BMI >= 40) ([place]) - Plan: EKG 12-Lead We have encouraged continued exercise, careful diet management in an effort to lose weight.   Total encounter time more than 25 minutes  Greater than 50% was spent in counseling and coordination of care with the patient   Disposition:   F/U  6 months   Orders Placed This Encounter  Procedures  . EKG 12-Lead     Signed, Esmond Plants, M.D., Ph.D. 06/05/2016  [place], [place]

## 2016-06-05 ENCOUNTER — Ambulatory Visit (INDEPENDENT_AMBULATORY_CARE_PROVIDER_SITE_OTHER): Payer: Medicare Other | Admitting: Cardiovascular Disease

## 2016-06-05 ENCOUNTER — Encounter: Payer: Self-pay | Admitting: Cardiovascular Disease

## 2016-06-05 VITALS — BP 108/70 | HR 65 | Ht 68.5 in | Wt 257.2 lb

## 2016-06-05 DIAGNOSIS — I2510 Atherosclerotic heart disease of native coronary artery without angina pectoris: Secondary | ICD-10-CM

## 2016-06-05 DIAGNOSIS — I6521 Occlusion and stenosis of right carotid artery: Secondary | ICD-10-CM

## 2016-06-05 DIAGNOSIS — E785 Hyperlipidemia, unspecified: Secondary | ICD-10-CM | POA: Diagnosis not present

## 2016-06-05 DIAGNOSIS — I10 Essential (primary) hypertension: Secondary | ICD-10-CM | POA: Diagnosis not present

## 2016-06-05 NOTE — Addendum Note (Signed)
Addended by: Dede Query R on: 06/05/2016 01:11 PM   Modules accepted: Orders

## 2016-06-05 NOTE — Patient Instructions (Signed)

## 2016-06-19 DIAGNOSIS — M19012 Primary osteoarthritis, left shoulder: Secondary | ICD-10-CM | POA: Diagnosis not present

## 2016-06-19 DIAGNOSIS — R936 Abnormal findings on diagnostic imaging of limbs: Secondary | ICD-10-CM | POA: Diagnosis not present

## 2016-06-19 DIAGNOSIS — S43002A Unspecified subluxation of left shoulder joint, initial encounter: Secondary | ICD-10-CM | POA: Diagnosis not present

## 2016-06-19 DIAGNOSIS — M25512 Pain in left shoulder: Secondary | ICD-10-CM | POA: Diagnosis not present

## 2016-06-19 DIAGNOSIS — M67912 Unspecified disorder of synovium and tendon, left shoulder: Secondary | ICD-10-CM | POA: Diagnosis not present

## 2016-06-28 DIAGNOSIS — M7582 Other shoulder lesions, left shoulder: Secondary | ICD-10-CM | POA: Diagnosis not present

## 2016-07-04 DIAGNOSIS — M7582 Other shoulder lesions, left shoulder: Secondary | ICD-10-CM | POA: Diagnosis not present

## 2016-07-05 ENCOUNTER — Other Ambulatory Visit: Payer: Self-pay | Admitting: Cardiovascular Disease

## 2016-07-05 DIAGNOSIS — I6521 Occlusion and stenosis of right carotid artery: Secondary | ICD-10-CM

## 2016-07-06 DIAGNOSIS — M7582 Other shoulder lesions, left shoulder: Secondary | ICD-10-CM | POA: Diagnosis not present

## 2016-07-24 ENCOUNTER — Ambulatory Visit: Payer: Medicare Other

## 2016-07-24 DIAGNOSIS — I6521 Occlusion and stenosis of right carotid artery: Secondary | ICD-10-CM

## 2016-07-24 LAB — VAS US CAROTID
LCCADDIAS: 15 cm/s
LEFT ECA DIAS: -17 cm/s
LEFT VERTEBRAL DIAS: -10 cm/s
LICADDIAS: -26 cm/s
LICADSYS: -98 cm/s
LICAPDIAS: -20 cm/s
LICAPSYS: -88 cm/s
Left CCA dist sys: 100 cm/s
Left CCA prox dias: 7 cm/s
Left CCA prox sys: 71 cm/s
RIGHT ECA DIAS: -14 cm/s
RIGHT VERTEBRAL DIAS: -9 cm/s
Right CCA prox dias: 0 cm/s
Right CCA prox sys: 114 cm/s
Right cca dist sys: -94 cm/s

## 2016-07-26 ENCOUNTER — Ambulatory Visit (INDEPENDENT_AMBULATORY_CARE_PROVIDER_SITE_OTHER): Payer: Medicare Other

## 2016-07-26 ENCOUNTER — Other Ambulatory Visit: Payer: Self-pay | Admitting: Family Medicine

## 2016-07-26 ENCOUNTER — Other Ambulatory Visit: Payer: Medicare Other

## 2016-07-26 VITALS — BP 130/82 | HR 94 | Temp 97.7°F | Ht 66.75 in | Wt 250.5 lb

## 2016-07-26 DIAGNOSIS — Z0000 Encounter for general adult medical examination without abnormal findings: Secondary | ICD-10-CM

## 2016-07-26 LAB — CBC WITH DIFFERENTIAL/PLATELET
Basophils Absolute: 0 10*3/uL (ref 0.0–0.1)
Basophils Relative: 0.5 % (ref 0.0–3.0)
EOS PCT: 6.3 % — AB (ref 0.0–5.0)
Eosinophils Absolute: 0.4 10*3/uL (ref 0.0–0.7)
HCT: 46.1 % (ref 39.0–52.0)
HEMOGLOBIN: 15.2 g/dL (ref 13.0–17.0)
Lymphocytes Relative: 30.1 % (ref 12.0–46.0)
Lymphs Abs: 2 10*3/uL (ref 0.7–4.0)
MCHC: 32.9 g/dL (ref 30.0–36.0)
MCV: 88.7 fl (ref 78.0–100.0)
MONO ABS: 1.2 10*3/uL — AB (ref 0.1–1.0)
Monocytes Relative: 18 % — ABNORMAL HIGH (ref 3.0–12.0)
Neutro Abs: 3 10*3/uL (ref 1.4–7.7)
Neutrophils Relative %: 45.1 % (ref 43.0–77.0)
Platelets: 187 10*3/uL (ref 150.0–400.0)
RBC: 5.19 Mil/uL (ref 4.22–5.81)
RDW: 15.5 % (ref 11.5–15.5)
WBC: 6.6 10*3/uL (ref 4.0–10.5)

## 2016-07-26 LAB — TSH: TSH: 0.79 u[IU]/mL (ref 0.35–4.50)

## 2016-07-26 LAB — BASIC METABOLIC PANEL
BUN: 26 mg/dL — AB (ref 6–23)
CO2: 27 meq/L (ref 19–32)
CREATININE: 0.92 mg/dL (ref 0.40–1.50)
Calcium: 9.6 mg/dL (ref 8.4–10.5)
Chloride: 104 mEq/L (ref 96–112)
GFR: 84.56 mL/min (ref >60.00)
Glucose, Bld: 97 mg/dL (ref 70–99)
Potassium: 5.3 mEq/L — ABNORMAL HIGH (ref 3.5–5.1)
Sodium: 138 mEq/L (ref 135–145)

## 2016-07-26 LAB — PSA, MEDICARE: PSA: 0.33 ng/mL (ref 0.10–4.00)

## 2016-07-26 LAB — HEPATIC FUNCTION PANEL
ALK PHOS: 85 U/L (ref 39–117)
ALT: 23 U/L (ref 0–53)
AST: 22 U/L (ref 0–37)
Albumin: 3.9 g/dL (ref 3.5–5.2)
BILIRUBIN TOTAL: 0.7 mg/dL (ref 0.2–1.2)
Bilirubin, Direct: 0.1 mg/dL (ref 0.0–0.3)
Total Protein: 6.6 g/dL (ref 6.0–8.3)

## 2016-07-26 LAB — LIPID PANEL
Cholesterol: 158 mg/dL (ref 0–200)
HDL: 64.9 mg/dL (ref >39.00)
LDL Cholesterol: 81 mg/dL (ref 0–99)
NONHDL: 93.3
Total CHOL/HDL Ratio: 2
Triglycerides: 62 mg/dL (ref 0.0–149.0)
VLDL: 12.4 mg/dL (ref 0.0–40.0)

## 2016-07-26 NOTE — Patient Instructions (Signed)
Hunter Young , Thank you for taking time to come for your Medicare Wellness Visit. I appreciate your ongoing commitment to your health goals. Please review the following plan we discussed and let me know if I can assist you in the future.   These are the goals we discussed: Goals    . Eat more fruits and vegetables          Starting 07/26/2016, I will continue to eat 3-4 servings of fresh fruits and vegetables.        This is a list of the screening recommended for you and due dates:  Health Maintenance  Topic Date Due  . Flu Shot  10/25/2016  . Colon Cancer Screening  08/26/2017  . Tetanus Vaccine  07/23/2018  . Pneumonia vaccines  Completed   Preventive Care for Adults  A healthy lifestyle and preventive care can promote health and wellness. Preventive health guidelines for adults include the following key practices.  . A routine yearly physical is a good way to check with your health care provider about your health and preventive screening. It is a chance to share any concerns and updates on your health and to receive a thorough exam.  . Visit your dentist for a routine exam and preventive care every 6 months. Brush your teeth twice a day and floss once a day. Good oral hygiene prevents tooth decay and gum disease.  . The frequency of eye exams is based on your age, health, family medical history, use  of contact lenses, and other factors. Follow your health care provider's ecommendations for frequency of eye exams.  . Eat a healthy diet. Foods like vegetables, fruits, whole grains, low-fat dairy products, and lean protein foods contain the nutrients you need without too many calories. Decrease your intake of foods high in solid fats, added sugars, and salt. Eat the right amount of calories for you. Get information about a proper diet from your health care provider, if necessary.  . Regular physical exercise is one of the most important things you can do for your health. Most adults  should get at least 150 minutes of moderate-intensity exercise (any activity that increases your heart rate and causes you to sweat) each week. In addition, most adults need muscle-strengthening exercises on 2 or more days a week.  Silver Sneakers may be a benefit available to you. To determine eligibility, you may visit the website: www.silversneakers.com or contact program at 559 434 7031 Mon-Fri between 8AM-8PM.   . Maintain a healthy weight. The body mass index (BMI) is a screening tool to identify possible weight problems. It provides an estimate of body fat based on height and weight. Your health care provider can find your BMI and can help you achieve or maintain a healthy weight.   For adults 20 years and older: ? A BMI below 18.5 is considered underweight. ? A BMI of 18.5 to 24.9 is normal. ? A BMI of 25 to 29.9 is considered overweight. ? A BMI of 30 and above is considered obese.   . Maintain normal blood lipids and cholesterol levels by exercising and minimizing your intake of saturated fat. Eat a balanced diet with plenty of fruit and vegetables. Blood tests for lipids and cholesterol should begin at age 61 and be repeated every 5 years. If your lipid or cholesterol levels are high, you are over 50, or you are at high risk for heart disease, you may need your cholesterol levels checked more frequently. Ongoing high lipid and cholesterol  levels should be treated with medicines if diet and exercise are not working.  . If you smoke, find out from your health care provider how to quit. If you do not use tobacco, please do not start.  . If you choose to drink alcohol, please do not consume more than 2 drinks per day. One drink is considered to be 12 ounces (355 mL) of beer, 5 ounces (148 mL) of wine, or 1.5 ounces (44 mL) of liquor.  . If you are 13-22 years old, ask your health care provider if you should take aspirin to prevent strokes.  . Use sunscreen. Apply sunscreen liberally and  repeatedly throughout the day. You should seek shade when your shadow is shorter than you. Protect yourself by wearing long sleeves, pants, a wide-brimmed hat, and sunglasses year round, whenever you are outdoors.  . Once a month, do a whole body skin exam, using a mirror to look at the skin on your back. Tell your health care provider of new moles, moles that have irregular borders, moles that are larger than a pencil eraser, or moles that have changed in shape or color.

## 2016-07-26 NOTE — Progress Notes (Signed)
Pre visit review using our clinic review tool, if applicable. No additional management support is needed unless otherwise documented below in the visit note. 

## 2016-07-26 NOTE — Progress Notes (Signed)
Subjective:   Hunter Young is a 76 y.o. male who presents for an Initial Medicare Annual Wellness Visit.  Review of Systems  N/A Cardiac Risk Factors include: advanced age (>14men, >59 women);sedentary lifestyle;male gender;hypertension;obesity (BMI >30kg/m2);dyslipidemia    Objective:    Today's Vitals   07/26/16 0833  BP: 130/82  Pulse: 94  Temp: 97.7 F (36.5 C)  TempSrc: Oral  SpO2: 95%  Weight: 250 lb 8 oz (113.6 kg)  Height: 5' 6.75" (1.695 m)  PainSc: 0-No pain   Body mass index is 39.53 kg/m.  Current Medications (verified) Outpatient Encounter Prescriptions as of 07/26/2016  Medication Sig  . aspirin 81 MG tablet Take 81 mg by mouth daily.    Marland Kitchen atorvastatin (LIPITOR) 40 MG tablet Take 1 tablet (40 mg total) by mouth daily.  Marland Kitchen lisinopril (PRINIVIL,ZESTRIL) 40 MG tablet TAKE 1 TABLET(40 MG) BY MOUTH DAILY  . meclizine (ANTIVERT) 25 MG tablet Take 1 tablet (25 mg total) by mouth 3 (three) times daily as needed for dizziness.  . Multiple Vitamin (MULTIVITAMIN) tablet Take 1 tablet by mouth daily.    . Multiple Vitamins-Minerals (PRESERVISION AREDS 2 PO) Take 2 capsules by mouth daily.  . nitroGLYCERIN (NITROSTAT) 0.4 MG SL tablet Place 1 tablet (0.4 mg total) under the tongue every 5 (five) minutes as needed.  Marland Kitchen oxybutynin (DITROPAN) 5 MG tablet TAKE 1 TABLET(5 MG) BY MOUTH THREE TIMES DAILY  . [DISCONTINUED] Coenzyme Q10 (CO Q-10) 200 MG CAPS Take by mouth daily.  . [DISCONTINUED] Multiple Vitamins-Minerals (PRESERVISION AREDS 2 PO) Take by mouth 2 (two) times daily.   No facility-administered encounter medications on file as of 07/26/2016.     Allergies (verified) Oxycodone-aspirin   History: Past Medical History:  Diagnosis Date  . ALLERGIC RHINITIS 04/22/2007  . ASTHMA, INTRINSIC NOS 12/27/2006  . BENIGN PROSTATIC HYPERTROPHY, WITH URINARY OBSTRUCTION 06/25/2007  . Cardiomegaly 05/30/2007  . Carotid stenosis 01/29/2014  . CERVICAL DISORDER, NOS 10/14/2009  .  CORONARY ARTERY DISEASE 12/03/2006  . DIVERTICULOSIS OF COLON 05/20/2001  . Dyshidrosis 06/25/2007  . GERD 12/06/2007  . HYPERLIPIDEMIA 12/03/2006  . HYPERTENSION 12/03/2006  . INTERNAL HEMORRHOIDS 05/20/2001  . OBESITY, MODERATE 12/05/2008  . OSTEOARTHRITIS 06/25/2007  . Plantar fasciitis   . VENOUS INSUFFICIENCY 12/04/2006   Past Surgical History:  Procedure Laterality Date  . ANGIOPLASTY     2 stents  . FOOT SURGERY    . JOINT REPLACEMENT     bilat knee  . TONSILLECTOMY     Family History  Problem Relation Age of Onset  . Colon cancer Mother 60   Social History   Occupational History  . Not on file.   Social History Main Topics  . Smoking status: Former Smoker    Quit date: 08/21/1980  . Smokeless tobacco: Never Used  . Alcohol use 12.6 oz/week    21 Standard drinks or equivalent per week     Comment: occ  . Drug use: No  . Sexual activity: Not on file   Tobacco Counseling Counseling given: No   Activities of Daily Living In your present state of health, do you have any difficulty performing the following activities: 07/26/2016  Hearing? Y  Vision? Y  Difficulty concentrating or making decisions? N  Walking or climbing stairs? Y  Dressing or bathing? N  Doing errands, shopping? N  Preparing Food and eating ? N  Using the Toilet? N  In the past six months, have you accidently leaked urine? N  Do you  have problems with loss of bowel control? N  Managing your Medications? N  Managing your Finances? N  Housekeeping or managing your Housekeeping? N  Some recent data might be hidden    Immunizations and Health Maintenance Immunization History  Administered Date(s) Administered  . Hepatitis B 07/22/2008  . Influenza Split 01/19/2011, 12/21/2011  . Influenza Whole 01/10/2007, 12/25/2008  . Influenza,inj,Quad PF,36+ Mos 12/16/2012, 01/28/2014, 02/11/2015, 11/25/2015  . Pneumococcal Conjugate-13 01/28/2014  . Pneumococcal Polysaccharide-23 03/27/2004, 01/19/2011  . Td  03/27/1998, 07/22/2008   There are no preventive care reminders to display for this patient.  Patient Care Team: Owens Loffler, MD as PCP - General (Family Medicine) Minna Merritts, MD as Consulting Physician (Cardiology)    Assessment:   This is a routine wellness examination for Hunter Young.   Hearing/Vision screen  Hearing Screening   125Hz  250Hz  500Hz  1000Hz  2000Hz  3000Hz  4000Hz  6000Hz  8000Hz   Right ear:   40 40 40  0    Left ear:   40 40 0  0      Visual Acuity Screening   Right eye Left eye Both eyes  Without correction: 20/25 20/200 20/20  With correction:       Dietary issues and exercise activities discussed: Current Exercise Habits: The patient does not participate in regular exercise at present, Exercise limited by: None identified  Goals    . Eat more fruits and vegetables          Starting 07/26/2016, I will continue to eat 3-4 servings of fresh fruits and vegetables.       Depression Screen PHQ 2/9 Scores 07/26/2016 07/26/2015 01/28/2014  PHQ - 2 Score 0 0 0    Fall Risk Fall Risk  07/26/2016 07/26/2015 01/28/2014  Falls in the past year? No Yes No  Number falls in past yr: - 1 -  Injury with Fall? - Yes -    Cognitive Function: MMSE - Mini Mental State Exam 07/26/2016  Orientation to time 5  Orientation to Place 5  Registration 3  Attention/ Calculation 0  Recall 0  Recall-comments pt was unable to recall 3 of 3 words  Language- name 2 objects 0  Language- repeat 1  Language- follow 3 step command 3  Language- read & follow direction 0  Write a sentence 0  Copy design 0  Total score 17     PLEASE NOTE: A Mini-Cog screen was completed. Maximum score is 20. A value of 0 denotes this part of [place] MMSE was not completed or the patient failed this part of the Mini-Cog screening.   Mini-Cog Screening Orientation to Time - Max 5 pts Orientation to Place - Max 5 pts Registration - Max 3 pts Recall - Max 3 pts Language Repeat - Max 1 pts Language Follow 3  Step Command - Max 3 pts     Screening Tests Health Maintenance  Topic Date Due  . INFLUENZA VACCINE  10/25/2016  . COLONOSCOPY  08/26/2017  . TETANUS/TDAP  07/23/2018  . PNA vac Low Risk Adult  Completed        Plan:     I have personally reviewed and addressed the Medicare Annual Wellness questionnaire and have noted the following in the patient's chart:  A. Medical and social history B. Use of alcohol, tobacco or illicit drugs  C. Current medications and supplements D. Functional ability and status E.  Nutritional status F.  Physical activity G. Advance directives H. List of other physicians I.  Hospitalizations, surgeries, and  ER visits in previous 12 months J.  [place] to include hearing, vision, cognitive, depression L. Referrals and appointments - none  In addition, I have reviewed and discussed with patient certain preventive protocols, quality metrics, and best practice recommendations. A written personalized care plan for preventive services as well as general preventive health recommendations were provided to patient.  See attached scanned questionnaire for additional information.   Signed,   Lindell Noe, MHA, BS, LPN Health Coach

## 2016-07-26 NOTE — Progress Notes (Signed)
I reviewed health advisor's note, was available for consultation, and agree with documentation and plan.   Signed,  Taji Barretto T. Jock Mahon, MD  

## 2016-07-26 NOTE — Progress Notes (Signed)
PCP notes:   Health maintenance:  No gaps identified.  Abnormal screenings:   Hearing - failed Mini-Cog score: 17/20  Patient concerns:   None  Nurse concerns:  None  Next PCP appt:   07/31/16 @ 0830

## 2016-07-31 ENCOUNTER — Encounter: Payer: Medicare Other | Admitting: Family Medicine

## 2016-08-03 ENCOUNTER — Encounter: Payer: Medicare Other | Admitting: Family Medicine

## 2016-08-08 ENCOUNTER — Other Ambulatory Visit: Payer: Self-pay | Admitting: Family Medicine

## 2016-08-08 NOTE — Telephone Encounter (Signed)
I believe he is using this for vertigo.. Not a safe med to use in  > 65 years for longterm. Dr/ Copkland did not continue at [place].  refill refused. If recurrent symptoms.. Needs to be re-evaluated

## 2016-08-08 NOTE — Telephone Encounter (Signed)
Last office visit 07/26/2016 with Sharrell Ku.  Not on current medication list.  Refill?

## 2016-08-08 NOTE — Telephone Encounter (Addendum)
Hunter Young notified as instructed by telephone.  He is going out of town tomorrow afternoon so he declined an appointment.  He states he will just get somebody else to do it. [facility] notified by telephone that the refill has been denied.

## 2016-08-14 ENCOUNTER — Encounter: Payer: Medicare Other | Admitting: Family Medicine

## 2016-08-28 ENCOUNTER — Encounter: Payer: Self-pay | Admitting: Family Medicine

## 2016-08-28 ENCOUNTER — Ambulatory Visit (INDEPENDENT_AMBULATORY_CARE_PROVIDER_SITE_OTHER): Payer: Medicare Other | Admitting: Family Medicine

## 2016-08-28 VITALS — BP 126/74 | HR 64 | Temp 97.6°F | Ht 66.75 in | Wt 255.5 lb

## 2016-08-28 DIAGNOSIS — M25569 Pain in unspecified knee: Secondary | ICD-10-CM | POA: Diagnosis not present

## 2016-08-28 DIAGNOSIS — M79606 Pain in leg, unspecified: Secondary | ICD-10-CM | POA: Diagnosis not present

## 2016-08-28 DIAGNOSIS — Z0001 Encounter for general adult medical examination with abnormal findings: Secondary | ICD-10-CM | POA: Diagnosis not present

## 2016-08-28 MED ORDER — MECLIZINE HCL 25 MG PO TABS: 25.0000 mg | ORAL_TABLET | Freq: Three times a day (TID) | ORAL | 2 refills | Status: DC | PRN

## 2016-08-28 MED ORDER — DIAZEPAM 2 MG PO TABS: 2.0000 mg | ORAL_TABLET | Freq: Four times a day (QID) | ORAL | 1 refills | Status: DC | PRN

## 2016-08-28 NOTE — Progress Notes (Signed)
Dr. Frederico Hamman T. Shadee Rathod, MD, [facility] [place], [address] Phone: [phone] Fax: [phone]  08/28/2016  Patient: Hunter Young, MRN: [id number], DOB: 06/20/1940, 76 y.o.  Primary Physician:  Owens Loffler, MD   Chief Complaint  Patient presents with  . Annual Exam    Part 2   Subjective:   Hunter Young is a 76 y.o. pleasant patient who presents with the following:  Preventative Health Maintenance Visit:  Health Maintenance Summary Reviewed and updated, unless pt declines services.  Tobacco History Reviewed. Alcohol: 1-3 drinks nightly Exercise Habits: Some activity, rec at least 30 mins 5 times a week STD concerns: no risk or activity to increase risk Drug Use: None Encouraged self-testicular check  Generally he is feeling well. He does have some fatigue. He occasionally will have some aches and pains in his legs and in his knees.  Health Maintenance  Topic Date Due  . INFLUENZA VACCINE  10/25/2016  . COLONOSCOPY  08/26/2017  . TETANUS/TDAP  07/23/2018  . PNA vac Low Risk Adult  Completed   Immunization History  Administered Date(s) Administered  . Hepatitis B 07/22/2008  . Influenza Split 01/19/2011, 12/21/2011  . Influenza Whole 01/10/2007, 12/25/2008  . Influenza,inj,Quad PF,36+ Mos 12/16/2012, 01/28/2014, 02/11/2015, 11/25/2015  . Pneumococcal Conjugate-13 01/28/2014  . Pneumococcal Polysaccharide-23 03/27/2004, 01/19/2011  . Td 03/27/1998, 07/22/2008   Patient Active Problem List   Diagnosis Date Noted  . Stenosis of right carotid artery 01/29/2014  . Severe obesity (BMI >= 40) ([place]) 04/02/2013  . CERVICAL DISORDER, NOS 10/14/2009  . GERD 12/06/2007  . BPH (benign prostatic hypertrophy) with urinary obstruction 06/25/2007  . OSTEOARTHRITIS 06/25/2007  . CARDIOMEGALY 05/30/2007  . Allergic rhinitis 04/22/2007  . VENOUS INSUFFICIENCY 12/04/2006  . Hyperlipidemia LDL goal <70 12/03/2006   . Essential hypertension 12/03/2006  . Coronary artery disease involving native coronary artery without angina pectoris 12/03/2006  . DIVERTICULOSIS OF COLON 05/20/2001   Past Medical History:  Diagnosis Date  . ALLERGIC RHINITIS 04/22/2007  . ASTHMA, INTRINSIC NOS 12/27/2006  . BENIGN PROSTATIC HYPERTROPHY, WITH URINARY OBSTRUCTION 06/25/2007  . Cardiomegaly 05/30/2007  . Carotid stenosis 01/29/2014  . CERVICAL DISORDER, NOS 10/14/2009  . CORONARY ARTERY DISEASE 12/03/2006  . DIVERTICULOSIS OF COLON 05/20/2001  . Dyshidrosis 06/25/2007  . GERD 12/06/2007  . HYPERLIPIDEMIA 12/03/2006  . HYPERTENSION 12/03/2006  . INTERNAL HEMORRHOIDS 05/20/2001  . OBESITY, MODERATE 12/05/2008  . OSTEOARTHRITIS 06/25/2007  . Plantar fasciitis   . VENOUS INSUFFICIENCY 12/04/2006   Past Surgical History:  Procedure Laterality Date  . ANGIOPLASTY     2 stents  . FOOT SURGERY    . JOINT REPLACEMENT     bilat knee  . TONSILLECTOMY     Social History   Social History  . Marital status: Married    Spouse name: N/A  . Number of children: N/A  . Years of education: N/A   Occupational History  . Not on file.   Social History Main Topics  . Smoking status: Former Smoker    Quit date: 08/21/1980  . Smokeless tobacco: Never Used  . Alcohol use 12.6 oz/week    21 Standard drinks or equivalent per week     Comment: occ  . Drug use: No  . Sexual activity: Not on file   Other Topics Concern  . Not on file   Social History Narrative   Wife:   Works for Dr. Barbie Haggis one of the cancer  doctors.       Works part time for [employer].    Runs hub to [place], [place]. 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    In trucking business for a long time.   Family History  Problem Relation Age of Onset  . Colon cancer Mother 59   Allergies  Allergen Reactions  . Oxycodone-Aspirin Nausea And Vomiting    Medication list has been reviewed and updated.   General: Denies fever, chills, sweats. No significant weight loss. Eyes: Denies  blurring,significant itching ENT: INTERMITTENT VERTIGO AND TINNITUS Cardiovascular: Denies chest pains, palpitations, dyspnea on exertion Respiratory: Denies cough, dyspnea at rest,wheeezing Breast: no concerns about lumps GI: Denies nausea, vomiting, diarrhea, constipation, change in bowel habits, abdominal pain, melena, hematochezia GU: Denies penile discharge, ED, urinary flow / outflow problems. No STD concerns. Musculoskeletal: intermittent joint pains Derm: Denies rash, itching Neuro: Denies  paresthesias, frequent falls, frequent headaches Psych: Denies depression, anxiety Endocrine: Denies cold intolerance, heat intolerance, polydipsia Heme: Denies enlarged lymph nodes Allergy: No hayfever  Objective:   BP 126/74   Pulse 64   Temp 97.6 F (36.4 C) (Oral)   Ht 5' 6.75" (1.695 m)   Wt 255 lb 8 oz (115.9 kg)   BMI 40.32 kg/m  Ideal Body Weight: Weight in (lb) to have BMI = 25: 158.1  No exam data present  GEN: well developed, well nourished, no acute distress Eyes: conjunctiva and lids normal, PERRLA, EOMI ENT: TM clear, nares clear, oral exam WNL Neck: supple, no lymphadenopathy, no thyromegaly, no JVD Pulm: clear to auscultation and percussion, respiratory effort normal CV: regular rate and rhythm, S1-S2, no murmur, rub or gallop, no bruits, peripheral pulses normal and symmetric, no cyanosis, clubbing, edema or varicosities GI: soft, non-tender; no hepatosplenomegaly, masses; active bowel sounds all quadrants GU: no hernia, testicular mass, penile discharge Lymph: no cervical, axillary or inguinal adenopathy MSK: gait normal, muscle tone and strength WNL, no joint swelling, effusions, discoloration, crepitus  SKIN: clear, good turgor, color WNL, no rashes, lesions, or ulcerations Neuro: normal mental status, normal strength, sensation, and motion Psych: alert; oriented to person, place and time, normally interactive and not anxious or depressed in appearance. All  labs reviewed with patient.  Lipids:    Component Value Date/Time   CHOL 158 07/26/2016 0857   CHOL 161 06/11/2014 0759   TRIG 62.0 07/26/2016 0857   HDL 64.90 07/26/2016 0857   HDL 68 06/11/2014 0759   VLDL 12.4 07/26/2016 0857   CHOLHDL 2 07/26/2016 0857   CBC: CBC Latest Ref Rng & Units 07/26/2016 07/19/2015 01/26/2014  WBC 4.0 - 10.5 K/uL 6.6 7.4 7.9  Hemoglobin 13.0 - 17.0 g/dL 15.2 14.1 14.6  Hematocrit 39.0 - 52.0 % 46.1 43.5 45.9  Platelets 150.0 - 400.0 K/uL 187.0 159.0 121.9    Basic Metabolic Panel:    Component Value Date/Time   NA 138 07/26/2016 0857   K 5.3 (H) 07/26/2016 0857   CL 104 07/26/2016 0857   CO2 27 07/26/2016 0857   BUN 26 (H) 07/26/2016 0857   CREATININE 0.92 07/26/2016 0857   GLUCOSE 97 07/26/2016 0857   CALCIUM 9.6 07/26/2016 0857   Hepatic Function Latest Ref Rng & Units 07/26/2016 07/19/2015 01/26/2014  Total Protein 6.0 - 8.3 g/dL 6.6 6.7 6.4  Albumin 3.5 - 5.2 g/dL 3.9 3.8 3.1(L)  AST 0 - 37 U/L 22 19 26   ALT 0 - 53 U/L 23 21 31   Alk Phosphatase 39 - 117 U/L 85 70 55  Total  Bilirubin 0.2 - 1.2 mg/dL 0.7 0.5 0.7  Bilirubin, Direct 0.0 - 0.3 mg/dL 0.1 0.1 0.1    Lab Results  Component Value Date   TSH 0.79 07/26/2016   Lab Results  Component Value Date   PSA 0.33 07/26/2016   PSA 0.37 01/26/2014   PSA 0.35 01/11/2011    Assessment and Plan:   Encounter for general adult medical examination with abnormal findings   Health Maintenance Exam: The patient's preventative maintenance and recommended screening tests for an annual wellness exam were reviewed in full today. Brought up to date unless services declined.  Counselled on the importance of diet, exercise, and its role in overall health and mortality. The patient's FH and SH was reviewed, including their home life, tobacco status, and drug and alcohol status.  Follow-up in 1 year for physical exam or additional follow-up below.  Intermittent vertigo and tinnitus. Refill  medications for such.  Follow-up: No Follow-up on file. Or follow-up in 1 year if not noted.  Meds ordered this encounter  Medications  . meclizine (ANTIVERT) 25 MG tablet    Sig: Take 1 tablet (25 mg total) by mouth 3 (three) times daily as needed for dizziness.    Dispense:  30 tablet    Refill:  2  . diazepam (VALIUM) 2 MG tablet    Sig: Take 1 tablet (2 mg total) by mouth every 6 (six) hours as needed for anxiety.    Dispense:  30 tablet    Refill:  1   Medications Discontinued During This Encounter  Medication Reason  . meclizine (ANTIVERT) 25 MG tablet Reorder   Signed,  Frederico Hamman T. Chrystine Frogge, MD   Allergies as of 08/28/2016      Reactions   Oxycodone-aspirin Nausea And Vomiting      Medication List       Accurate as of 08/28/16  1:08 PM. Always use your most recent med list.          aspirin 81 MG tablet Take 81 mg by mouth daily.   atorvastatin 40 MG tablet Commonly known as:  LIPITOR Take 1 tablet (40 mg total) by mouth daily.   diazepam 2 MG tablet Commonly known as:  VALIUM Take 1 tablet (2 mg total) by mouth every 6 (six) hours as needed for anxiety.   lisinopril 40 MG tablet Commonly known as:  PRINIVIL,ZESTRIL TAKE 1 TABLET(40 MG) BY MOUTH DAILY   meclizine 25 MG tablet Commonly known as:  ANTIVERT Take 1 tablet (25 mg total) by mouth 3 (three) times daily as needed for dizziness.   multivitamin tablet Take 1 tablet by mouth daily.   nitroGLYCERIN 0.4 MG SL tablet Commonly known as:  NITROSTAT Place 1 tablet (0.4 mg total) under the tongue every 5 (five) minutes as needed.   oxybutynin 5 MG tablet Commonly known as:  DITROPAN TAKE 1 TABLET(5 MG) BY MOUTH THREE TIMES DAILY   PRESERVISION AREDS 2 PO Take 2 capsules by mouth daily.

## 2016-09-04 ENCOUNTER — Other Ambulatory Visit: Payer: Self-pay | Admitting: Family Medicine

## 2016-11-03 ENCOUNTER — Other Ambulatory Visit: Payer: Self-pay | Admitting: Family Medicine

## 2016-11-15 ENCOUNTER — Other Ambulatory Visit: Payer: Self-pay | Admitting: Family Medicine

## 2016-12-01 DIAGNOSIS — L570 Actinic keratosis: Secondary | ICD-10-CM | POA: Diagnosis not present

## 2017-01-10 ENCOUNTER — Ambulatory Visit: Payer: Medicare Other

## 2017-02-13 ENCOUNTER — Telehealth: Payer: Self-pay

## 2017-02-13 NOTE — Telephone Encounter (Signed)
Pt last seen annual exam on 08/28/16.

## 2017-02-13 NOTE — Telephone Encounter (Signed)
Copied from [place] [id number]. Topic: Referral - Request >> Feb 13, 2017 11:33 AM Ether Griffins B wrote: Reason for CRM: pt would like a referral to a hearing doc for hearing aids. His hearing has worsened since last visit.  >> Feb 13, 2017  1:43 PM Ether Griffins B wrote: Pt calling back to check on status of referral. Pt says he's going out of the house for a bit and can leave VM with name of doc.

## 2017-02-14 ENCOUNTER — Other Ambulatory Visit: Payer: Self-pay | Admitting: Family Medicine

## 2017-02-14 DIAGNOSIS — H9190 Unspecified hearing loss, unspecified ear: Secondary | ICD-10-CM

## 2017-02-14 NOTE — Telephone Encounter (Signed)
done

## 2017-04-09 DIAGNOSIS — M48061 Spinal stenosis, lumbar region without neurogenic claudication: Secondary | ICD-10-CM | POA: Diagnosis not present

## 2017-04-09 DIAGNOSIS — M25562 Pain in left knee: Secondary | ICD-10-CM | POA: Diagnosis not present

## 2017-04-09 DIAGNOSIS — M25561 Pain in right knee: Secondary | ICD-10-CM | POA: Diagnosis not present

## 2017-05-14 ENCOUNTER — Other Ambulatory Visit: Payer: Self-pay | Admitting: Family Medicine

## 2017-06-07 ENCOUNTER — Encounter: Payer: Self-pay | Admitting: Family Medicine

## 2017-06-07 ENCOUNTER — Ambulatory Visit (INDEPENDENT_AMBULATORY_CARE_PROVIDER_SITE_OTHER): Payer: Medicare Other | Admitting: Family Medicine

## 2017-06-07 ENCOUNTER — Other Ambulatory Visit: Payer: Self-pay

## 2017-06-07 ENCOUNTER — Ambulatory Visit (INDEPENDENT_AMBULATORY_CARE_PROVIDER_SITE_OTHER)
Admission: RE | Admit: 2017-06-07 | Discharge: 2017-06-07 | Disposition: A | Discharge status: Home / Self Care | Payer: Medicare Other | Source: Ambulatory Visit | Attending: Family Medicine | Admitting: Family Medicine

## 2017-06-07 VITALS — BP 126/84 | HR 92 | Temp 97.8°F | Ht 66.75 in | Wt 255.5 lb

## 2017-06-07 DIAGNOSIS — M25552 Pain in left hip: Secondary | ICD-10-CM | POA: Diagnosis not present

## 2017-06-07 DIAGNOSIS — M25551 Pain in right hip: Secondary | ICD-10-CM | POA: Diagnosis not present

## 2017-06-07 DIAGNOSIS — S79911A Unspecified injury of right hip, initial encounter: Secondary | ICD-10-CM | POA: Diagnosis not present

## 2017-06-07 NOTE — Progress Notes (Signed)
Dr. Frederico Hamman T. Shaylen Nephew, MD, [facility] [place], [address] Phone: [phone] Fax: [phone]  06/07/2017  Patient: Hunter Young, MRN: [id number], DOB: 12-Jun-1940, 77 y.o.  Primary Physician:  Owens Loffler, MD   Chief Complaint  Patient presents with  . Leg Pain    Groin area-Fell off ladder x 3 months ago   Subjective:   Hunter Young is a 77 y.o. very pleasant male patient who presents with the following:  [place] off of a step ladder 2-3 months and hit and thought that it hurt quite a bit. Will hurt when he is walking. Always right in the groin. Body mass index is 40.32 kg/m.   He has been improving steadily over time, for the entirety of the 3 months, but he still does have some pain with impact activity and when he is trying to walk.  Other activities do not really seem to bother it, and he is fully functional with his activities of daily living.  Past Medical History, Surgical History, Social History, Family History, Problem List, Medications, and Allergies have been reviewed and updated if relevant.  Patient Active Problem List   Diagnosis Date Noted  . Stenosis of right carotid artery 01/29/2014  . Severe obesity (BMI >= 40) ([place]) 04/02/2013  . CERVICAL DISORDER, NOS 10/14/2009  . GERD 12/06/2007  . BPH (benign prostatic hypertrophy) with urinary obstruction 06/25/2007  . OSTEOARTHRITIS 06/25/2007  . CARDIOMEGALY 05/30/2007  . Allergic rhinitis 04/22/2007  . VENOUS INSUFFICIENCY 12/04/2006  . Hyperlipidemia LDL goal <70 12/03/2006  . Essential hypertension 12/03/2006  . Coronary artery disease involving native coronary artery without angina pectoris 12/03/2006  . DIVERTICULOSIS OF COLON 05/20/2001    Past Medical History:  Diagnosis Date  . ALLERGIC RHINITIS 04/22/2007  . ASTHMA, INTRINSIC NOS 12/27/2006  . BENIGN PROSTATIC HYPERTROPHY, WITH URINARY OBSTRUCTION 06/25/2007  . Cardiomegaly 05/30/2007    . Carotid stenosis 01/29/2014  . CERVICAL DISORDER, NOS 10/14/2009  . CORONARY ARTERY DISEASE 12/03/2006  . DIVERTICULOSIS OF COLON 05/20/2001  . Dyshidrosis 06/25/2007  . GERD 12/06/2007  . HYPERLIPIDEMIA 12/03/2006  . HYPERTENSION 12/03/2006  . INTERNAL HEMORRHOIDS 05/20/2001  . OBESITY, MODERATE 12/05/2008  . OSTEOARTHRITIS 06/25/2007  . VENOUS INSUFFICIENCY 12/04/2006    Past Surgical History:  Procedure Laterality Date  . ANGIOPLASTY     2 stents  . FOOT SURGERY    . JOINT REPLACEMENT     bilat knee  . TONSILLECTOMY      Social History   Socioeconomic History  . Marital status: Married    Spouse name: Not on file  . Number of children: Not on file  . Years of education: Not on file  . Highest education level: Not on file  Social Needs  . Financial resource strain: Not on file  . Food insecurity - worry: Not on file  . Food insecurity - inability: Not on file  . Transportation needs - medical: Not on file  . Transportation needs - non-medical: Not on file  Occupational History  . Not on file  Tobacco Use  . Smoking status: Former Smoker    Last attempt to quit: 08/21/1980    Years since quitting: 36.8  . Smokeless tobacco: Never Used  Substance and Sexual Activity  . Alcohol use: Yes    Alcohol/week: 12.6 oz    Types: 21 Standard drinks or equivalent per week    Comment: occ  . Drug use: No  .  Sexual activity: Not on file  Other Topics Concern  . 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       Works part time for [employer].    Runs hub to [place], [place]. 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    In trucking business for a long time.    Family History  Problem Relation Age of Onset  . Colon cancer Mother 37    Allergies  Allergen Reactions  . Oxycodone-Aspirin Nausea And Vomiting    Medication list reviewed and updated in full in [place].  GEN: No fevers, chills. Nontoxic. Primarily MSK c/o today. MSK:  Detailed in the HPI GI: tolerating PO intake without difficulty Neuro: No numbness, parasthesias, or tingling associated. Otherwise the pertinent positives of the ROS are noted above.   Objective:   BP 126/84   Pulse 92   Temp 97.8 F (36.6 C) (Oral)   Ht 5' 6.75" (1.695 m)   Wt 255 lb 8 oz (115.9 kg)   BMI 40.32 kg/m    GEN: WDWN, NAD, Non-toxic, Alert & Oriented x 3 HEENT: Atraumatic, Normocephalic.  Ears and Nose: No external deformity. EXTR: No clubbing/cyanosis/edema NEURO: Normal gait.  PSYCH: Normally interactive. Conversant. Not depressed or anxious appearing.  Calm demeanor.   HIP EXAM: SIDE: L ROM: Abduction, Flexion, Internal and External range of motion: full Pain with terminal IROM and EROM: minimal GTB: NT SLR: NEG Knees: No effusion FABER: NT REVERSE FABER: NT, neg Piriformis: NT at direct palpation Str: flexion: 5/5 abduction: 5/5 adduction: 5/5 Strength testing non-tender     Radiology: Dg Hip Unilat With Pelvis 2-3 Views Left  Result Date: 06/07/2017 CLINICAL DATA:  Status post fall a few months ago with continued pain of left hip. EXAM: DG HIP (WITH OR WITHOUT PELVIS) 2-3V LEFT COMPARISON:  None. FINDINGS: There is no evidence of hip fracture or dislocation. There are degenerative joint changes of bilateral hips with narrowed joint space and osteophyte formation. IMPRESSION: No acute fracture or dislocation. Degenerative joint changes of bilateral hips. Electronically Signed   By: Abelardo Diesel M.D.   On: 06/07/2017 14:52     Assessment and Plan:   Hip pain, acute, left - Plan: DG HIP UNILAT WITH PELVIS 2-3 VIEWS LEFT  Is examined films are very reassuring.  No evidence of old healing fracture.  I think that he can just keep progressing his physical activity, for now keeping away more from impact and doing things like riding a bicycle, recumbent bicycle, arc trainer, or working out in the pool would be more ideal.  Follow-up PRN.  Orders Placed  This Encounter  Procedures  . DG HIP UNILAT WITH PELVIS 2-3 VIEWS LEFT    Signed,  Shariq Puig T. Besse Miron, MD   Allergies as of 06/07/2017      Reactions   Oxycodone-aspirin Nausea And Vomiting      Medication List        Accurate as of 06/07/17 11:59 PM. Always use your most recent med list.          aspirin 81 MG tablet Take 81 mg by mouth daily.   atorvastatin 40 MG tablet Commonly known as:  LIPITOR TAKE 1 TABLET(40 MG) BY MOUTH DAILY   lisinopril 40 MG tablet Commonly known as:  PRINIVIL,ZESTRIL TAKE 1 TABLET(40 MG) BY MOUTH DAILY   meclizine 25 MG tablet Commonly known as:  ANTIVERT Take 1 tablet (25 mg total)  by mouth 3 (three) times daily as needed for dizziness.   multivitamin tablet Take 1 tablet by mouth daily.   nitroGLYCERIN 0.4 MG SL tablet Commonly known as:  NITROSTAT Place 1 tablet (0.4 mg total) under the tongue every 5 (five) minutes as needed.   oxybutynin 5 MG tablet Commonly known as:  DITROPAN TAKE 1 TABLET(5 MG) BY MOUTH THREE TIMES DAILY   PRESERVISION AREDS 2 PO Take 2 capsules by mouth daily.

## 2017-07-02 DIAGNOSIS — H353221 Exudative age-related macular degeneration, left eye, with active choroidal neovascularization: Secondary | ICD-10-CM | POA: Diagnosis not present

## 2017-07-02 DIAGNOSIS — Z961 Presence of intraocular lens: Secondary | ICD-10-CM | POA: Diagnosis not present

## 2017-07-03 DIAGNOSIS — Z961 Presence of intraocular lens: Secondary | ICD-10-CM | POA: Diagnosis not present

## 2017-07-03 DIAGNOSIS — H35342 Macular cyst, hole, or pseudohole, left eye: Secondary | ICD-10-CM | POA: Diagnosis not present

## 2017-07-03 DIAGNOSIS — H353111 Nonexudative age-related macular degeneration, right eye, early dry stage: Secondary | ICD-10-CM | POA: Diagnosis not present

## 2017-07-03 DIAGNOSIS — H353221 Exudative age-related macular degeneration, left eye, with active choroidal neovascularization: Secondary | ICD-10-CM | POA: Diagnosis not present

## 2017-07-03 DIAGNOSIS — H43811 Vitreous degeneration, right eye: Secondary | ICD-10-CM | POA: Diagnosis not present

## 2017-07-29 ENCOUNTER — Encounter: Payer: Self-pay | Admitting: Gastroenterology

## 2017-07-31 ENCOUNTER — Ambulatory Visit (INDEPENDENT_AMBULATORY_CARE_PROVIDER_SITE_OTHER): Payer: Medicare Other

## 2017-07-31 ENCOUNTER — Ambulatory Visit: Payer: Medicare Other

## 2017-07-31 VITALS — BP 122/80 | HR 85 | Temp 97.6°F | Ht 66.5 in | Wt 250.5 lb

## 2017-07-31 DIAGNOSIS — I10 Essential (primary) hypertension: Secondary | ICD-10-CM

## 2017-07-31 DIAGNOSIS — E785 Hyperlipidemia, unspecified: Secondary | ICD-10-CM

## 2017-07-31 DIAGNOSIS — Z0000 Encounter for general adult medical examination without abnormal findings: Secondary | ICD-10-CM

## 2017-07-31 DIAGNOSIS — Z125 Encounter for screening for malignant neoplasm of prostate: Secondary | ICD-10-CM

## 2017-07-31 LAB — HEPATIC FUNCTION PANEL
ALBUMIN: 3.7 g/dL (ref 3.5–5.2)
ALK PHOS: 71 U/L (ref 39–117)
ALT: 15 U/L (ref 0–53)
AST: 20 U/L (ref 0–37)
BILIRUBIN TOTAL: 0.7 mg/dL (ref 0.2–1.2)
Bilirubin, Direct: 0.2 mg/dL (ref 0.0–0.3)
Total Protein: 6.3 g/dL (ref 6.0–8.3)

## 2017-07-31 LAB — CBC WITH DIFFERENTIAL/PLATELET
BASOS PCT: 0.3 % (ref 0.0–3.0)
Basophils Absolute: 0 10*3/uL (ref 0.0–0.1)
EOS ABS: 0.2 10*3/uL (ref 0.0–0.7)
EOS PCT: 2.9 % (ref 0.0–5.0)
HCT: 44.3 % (ref 39.0–52.0)
Hemoglobin: 14.5 g/dL (ref 13.0–17.0)
LYMPHS ABS: 2.1 10*3/uL (ref 0.7–4.0)
Lymphocytes Relative: 29.1 % (ref 12.0–46.0)
MCHC: 32.6 g/dL (ref 30.0–36.0)
MCV: 88.9 fl (ref 78.0–100.0)
Monocytes Absolute: 0.9 10*3/uL (ref 0.1–1.0)
Monocytes Relative: 12.8 % — ABNORMAL HIGH (ref 3.0–12.0)
NEUTROS PCT: 54.9 % (ref 43.0–77.0)
Neutro Abs: 3.9 10*3/uL (ref 1.4–7.7)
PLATELETS: 163 10*3/uL (ref 150.0–400.0)
RBC: 4.99 Mil/uL (ref 4.22–5.81)
RDW: 14.4 % (ref 11.5–15.5)
WBC: 7 10*3/uL (ref 4.0–10.5)

## 2017-07-31 LAB — LIPID PANEL
Cholesterol: 152 mg/dL (ref 0–200)
HDL: 56.6 mg/dL (ref >39.00)
LDL Cholesterol: 82 mg/dL (ref 0–99)
NonHDL: 95
TRIGLYCERIDES: 64 mg/dL (ref 0.0–149.0)
Total CHOL/HDL Ratio: 3
VLDL: 12.8 mg/dL (ref 0.0–40.0)

## 2017-07-31 LAB — BASIC METABOLIC PANEL
BUN: 20 mg/dL (ref 6–23)
CHLORIDE: 105 meq/L (ref 96–112)
CO2: 27 meq/L (ref 19–32)
Calcium: 9.4 mg/dL (ref 8.4–10.5)
Creatinine, Ser: 0.86 mg/dL (ref 0.40–1.50)
GFR: 91.17 mL/min (ref >60.00)
Glucose, Bld: 92 mg/dL (ref 70–99)
Potassium: 4.8 mEq/L (ref 3.5–5.1)
SODIUM: 140 meq/L (ref 135–145)

## 2017-07-31 LAB — PSA, MEDICARE: PSA: 0.39 ng/mL (ref 0.10–4.00)

## 2017-07-31 LAB — TSH: TSH: 1.43 u[IU]/mL (ref 0.35–4.50)

## 2017-07-31 NOTE — Progress Notes (Signed)
PCP notes:   Health maintenance:  No gaps identified.  Abnormal screenings:   Mini-Cog score: 17/20 MMSE - Mini Mental State Exam 07/31/2017 07/26/2016  Orientation to time 5 5  Orientation to Place 5 5  Registration 3 3  Attention/ Calculation 0 0  Recall 0 0  Recall-comments unable to recall 3 of 3 words pt was unable to recall 3 of 3 words  Language- name 2 objects 0 0  Language- repeat 1 1  Language- follow 3 step command 3 3  Language- read & follow direction 0 0  Write a sentence 0 0  Copy design 0 0  Total score 17 17      Hearing screen - failed  Hearing Screening   125Hz  250Hz  500Hz  1000Hz  2000Hz  3000Hz  4000Hz  6000Hz  8000Hz   Right ear:   40 40 40  0    Left ear:   40 40 0  0     Patient concerns:   None  Nurse concerns:  None  Next PCP appt:   08/08/17 @ 0820

## 2017-07-31 NOTE — Progress Notes (Signed)
Subjective:   Hunter Young is a 77 y.o. male who presents for Medicare Annual/Subsequent preventive examination.  Review of Systems:  N/A Cardiac Risk Factors include: advanced age (>60men, >43 women);male gender;obesity (BMI >30kg/m2);dyslipidemia;hypertension     Objective:    Vitals: BP 122/80 (BP Location: Left Arm, Patient Position: Sitting, Cuff Size: Normal)   Pulse 85   Temp 97.6 F (36.4 C) (Oral)   Ht 5' 6.5" (1.689 m) Comment: no shoes  Wt 250 lb 8 oz (113.6 kg)   SpO2 94%   BMI 39.83 kg/m   Body mass index is 39.83 kg/m.  Advanced Directives 07/31/2017 07/26/2016  Does Patient Have a Medical Advance Directive? No No  Would patient like information on creating a medical advance directive? No - Patient declined -    Tobacco Social History   Tobacco Use  Smoking Status Former Smoker  . Last attempt to quit: 08/21/1980  . Years since quitting: 36.9  Smokeless Tobacco Never Used     Counseling given: No   Clinical Intake:  Pre-visit preparation completed: Yes  Pain : No/denies pain Pain Score: 5      Nutritional Status: BMI > 30  Obese Nutritional Risks: None Diabetes: No  How often do you need to have someone help you when you read instructions, pamphlets, or other written materials from your doctor or pharmacy?: 1 - Never What is the last grade level you completed in school?: 12th grade + 1.5 yrs college  Interpreter Needed?: No  Comments: ptl lives with spouse Information entered by :: LPinson, LPN  Past Medical History:  Diagnosis Date  . ALLERGIC RHINITIS 04/22/2007  . ASTHMA, INTRINSIC NOS 12/27/2006  . BENIGN PROSTATIC HYPERTROPHY, WITH URINARY OBSTRUCTION 06/25/2007  . Cardiomegaly 05/30/2007  . Carotid stenosis 01/29/2014  . CERVICAL DISORDER, NOS 10/14/2009  . CORONARY ARTERY DISEASE 12/03/2006  . DIVERTICULOSIS OF COLON 05/20/2001  . Dyshidrosis 06/25/2007  . GERD 12/06/2007  . HYPERLIPIDEMIA 12/03/2006  . HYPERTENSION 12/03/2006  . INTERNAL  HEMORRHOIDS 05/20/2001  . OBESITY, MODERATE 12/05/2008  . OSTEOARTHRITIS 06/25/2007  . VENOUS INSUFFICIENCY 12/04/2006   Past Surgical History:  Procedure Laterality Date  . ANGIOPLASTY     2 stents  . FOOT SURGERY    . JOINT REPLACEMENT     bilat knee  . TONSILLECTOMY     Family History  Problem Relation Age of Onset  . Colon cancer Mother 27   Social History   Socioeconomic History  . Marital status: Married    Spouse name: Not on file  . Number of children: Not on file  . Years of education: Not on file  . Highest education level: Not on file  Occupational History  . Not on file  Social Needs  . Financial resource strain: Not on file  . Food insecurity:    Worry: Not on file    Inability: Not on file  . Transportation needs:    Medical: Not on file    Non-medical: Not on file  Tobacco Use  . Smoking status: Former Smoker    Last attempt to quit: 08/21/1980    Years since quitting: 36.9  . Smokeless tobacco: Never Used  Substance and Sexual Activity  . Alcohol use: Yes    Alcohol/week: 12.6 oz    Types: 21 Standard drinks or equivalent per week  . Drug use: No  . Sexual activity: Not Currently  Lifestyle  . Physical activity:    Days per week: Not on file  Minutes per session: Not on file  . Stress: Not on file  Relationships  . Social connections:    Talks on phone: Not on file    Gets together: Not on file    Attends religious service: Not on file    Active member of club or organization: Not on file    Attends meetings of clubs or organizations: Not on file    Relationship status: Not on file  Other Topics Concern  . 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       Works part time for [employer].    Runs hub to [place], [place]. 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    In trucking business for a long time.    Outpatient Encounter Medications as of 07/31/2017  Medication Sig  . aspirin 81 MG tablet Take 81 mg by  mouth daily.    Marland Kitchen atorvastatin (LIPITOR) 40 MG tablet TAKE 1 TABLET(40 MG) BY MOUTH DAILY  . lisinopril (PRINIVIL,ZESTRIL) 40 MG tablet TAKE 1 TABLET(40 MG) BY MOUTH DAILY  . Multiple Vitamins-Minerals (PRESERVISION AREDS 2 PO) Take 2 capsules by mouth daily.  . nitroGLYCERIN (NITROSTAT) 0.4 MG SL tablet Place 1 tablet (0.4 mg total) under the tongue every 5 (five) minutes as needed.  Marland Kitchen oxybutynin (DITROPAN) 5 MG tablet TAKE 1 TABLET(5 MG) BY MOUTH THREE TIMES DAILY  . meclizine (ANTIVERT) 25 MG tablet Take 1 tablet (25 mg total) by mouth 3 (three) times daily as needed for dizziness. (Patient not taking: Reported on 07/31/2017)  . Multiple Vitamin (MULTIVITAMIN) tablet Take 1 tablet by mouth daily.     No facility-administered encounter medications on file as of 07/31/2017.     Activities of Daily Living In your present state of health, do you have any difficulty performing the following activities: 07/31/2017  Hearing? Y  Comment tinnitus  Vision? Y  Comment bursted blood vessel in left eye  Difficulty concentrating or making decisions? Y  Walking or climbing stairs? N  Dressing or bathing? N  Doing errands, shopping? N  Preparing Food and eating ? N  Using the Toilet? N  In the past six months, have you accidently leaked urine? Y  Do you have problems with loss of bowel control? N  Managing your Medications? N  Managing your Finances? N  Housekeeping or managing your Housekeeping? N  Some recent data might be hidden    Patient Care Team: Owens Loffler, MD as PCP - General (Family Medicine) Minna Merritts, MD as Consulting Physician (Cardiology)   Assessment:   This is a routine wellness examination for Jw.   Hearing Screening   125Hz  250Hz  500Hz  1000Hz  2000Hz  3000Hz  4000Hz  6000Hz  8000Hz   Right ear:   40 40 40  0    Left ear:   40 40 0  0      Visual Acuity Screening   Right eye Left eye Both eyes  Without correction: 20/25 20/200 20/25  With correction:         Exercise Activities and Dietary recommendations Current Exercise Habits: The patient does not participate in regular exercise at present, Exercise limited by: None identified  Goals    . DIET - EAT MORE FRUITS AND VEGETABLES     Starting 07/31/2017, I will continue to eat 4-5 servings of fresh fruits and vegetables.        Fall Risk Fall Risk  07/31/2017 07/26/2016 07/26/2015 01/28/2014  Falls in  the past year? No No Yes No  Number falls in past yr: - - 1 -  Injury with Fall? - - Yes -   Depression Screen PHQ 2/9 Scores 07/31/2017 07/26/2016 07/26/2015 01/28/2014  PHQ - 2 Score 0 0 0 0  PHQ- 9 Score 0 - - -    Cognitive Function MMSE - Mini Mental State Exam 07/31/2017 07/26/2016  Orientation to time 5 5  Orientation to Place 5 5  Registration 3 3  Attention/ Calculation 0 0  Recall 0 0  Recall-comments unable to recall 3 of 3 words pt was unable to recall 3 of 3 words  Language- name 2 objects 0 0  Language- repeat 1 1  Language- follow 3 step command 3 3  Language- read & follow direction 0 0  Write a sentence 0 0  Copy design 0 0  Total score 17 17     PLEASE NOTE: A Mini-Cog screen was completed. Maximum score is 20. A value of 0 denotes this part of [place]stein MMSE was not completed or the patient failed this part of the Mini-Cog screening.   Mini-Cog Screening Orientation to Time - Max 5 pts Orientation to Place - Max 5 pts Registration - Max 3 pts Recall - Max 3 pts Language Repeat - Max 1 pts Language Follow 3 Step Command - Max 3 pts     Immunization History  Administered Date(s) Administered  . Hepatitis B 07/22/2008  . Influenza Split 01/19/2011, 12/21/2011  . Influenza Whole 01/10/2007, 12/25/2008  . Influenza, High Dose Seasonal PF 01/10/2017  . Influenza,inj,Quad PF,6+ Mos 12/16/2012, 01/28/2014, 02/11/2015, 11/25/2015  . Pneumococcal Conjugate-13 01/28/2014  . Pneumococcal Polysaccharide-23 03/27/2004, 01/19/2011  . Td 03/27/1998, 07/22/2008    Screening  Tests Health Maintenance  Topic Date Due  . COLONOSCOPY  08/26/2017  . INFLUENZA VACCINE  10/25/2017  . TETANUS/TDAP  07/23/2018  . PNA vac Low Risk Adult  Completed      Plan:     I have personally reviewed, addressed, and noted the following in the patient's chart:  A. Medical and social history B. Use of alcohol, tobacco or illicit drugs  C. Current medications and supplements D. Functional ability and status E.  Nutritional status F.  Physical activity G. Advance directives H. List of other physicians I.  Hospitalizations, surgeries, and ER visits in previous 12 months J.  [place] to include hearing, vision, cognitive, depression L. Referrals and appointments - none  In addition, I have reviewed and discussed with patient certain preventive protocols, quality metrics, and best practice recommendations. A written personalized care plan for preventive services as well as general preventive health recommendations were provided to patient.  See attached scanned questionnaire for additional information.   Signed,   Lindell Noe, MHA, BS, LPN Health Coach

## 2017-07-31 NOTE — Patient Instructions (Addendum)
Mr. Shell , Thank you for taking time to come for your Medicare Wellness Visit. I appreciate your ongoing commitment to your health goals. Please review the following plan we discussed and let me know if I can assist you in the future.   These are the goals we discussed: Goals    . DIET - EAT MORE FRUITS AND VEGETABLES     Starting 07/31/2017, I will continue to eat 4-5 servings of fresh fruits and vegetables.        This is a list of the screening recommended for you and due dates:  Health Maintenance  Topic Date Due  . Colon Cancer Screening  08/26/2017  . Flu Shot  10/25/2017  . Tetanus Vaccine  07/23/2018  . Pneumonia vaccines  Completed   Preventive Care for Adults  A healthy lifestyle and preventive care can promote health and wellness. Preventive health guidelines for adults include the following key practices.  . A routine yearly physical is a good way to check with your health care provider about your health and preventive screening. It is a chance to share any concerns and updates on your health and to receive a thorough exam.  . Visit your dentist for a routine exam and preventive care every 6 months. Brush your teeth twice a day and floss once a day. Good oral hygiene prevents tooth decay and gum disease.  . The frequency of eye exams is based on your age, health, family medical history, use  of contact lenses, and other factors. Follow your health care provider's recommendations for frequency of eye exams.  . Eat a healthy diet. Foods like vegetables, fruits, whole grains, low-fat dairy products, and lean protein foods contain the nutrients you need without too many calories. Decrease your intake of foods high in solid fats, added sugars, and salt. Eat the right amount of calories for you. Get information about a proper diet from your health care provider, if necessary.  . Regular physical exercise is one of the most important things you can do for your health. Most  adults should get at least 150 minutes of moderate-intensity exercise (any activity that increases your heart rate and causes you to sweat) each week. In addition, most adults need muscle-strengthening exercises on 2 or more days a week.  Silver Sneakers may be a benefit available to you. To determine eligibility, you may visit the website: www.silversneakers.com or contact program at [phone]) 189-5998 Mon-Fri between 8AM-8PM.   . Maintain a healthy weight. The body mass index (BMI) is a screening tool to identify possible weight problems. It provides an estimate of body fat based on height and weight. Your health care provider can find your BMI and can help you achieve or maintain a healthy weight.   For adults 20 years and older: ? A BMI below 18.5 is considered underweight. ? A BMI of 18.5 to 24.9 is normal. ? A BMI of 25 to 29.9 is considered overweight. ? A BMI of 30 and above is considered obese.   . Maintain normal blood lipids and cholesterol levels by exercising and minimizing your intake of saturated fat. Eat a balanced diet with plenty of fruit and vegetables. Blood tests for lipids and cholesterol should begin at age 78 and be repeated every 5 years. If your lipid or cholesterol levels are high, you are over 50, or you are at high risk for heart disease, you may need your cholesterol levels checked more frequently. Ongoing high lipid and cholesterol levels should  be treated with medicines if diet and exercise are not working.  . If you smoke, find out from your health care provider how to quit. If you do not use tobacco, please do not start.  . If you choose to drink alcohol, please do not consume more than 2 drinks per day. One drink is considered to be 12 ounces (355 mL) of beer, 5 ounces (148 mL) of wine, or 1.5 ounces (44 mL) of liquor.  . If you are 19-20 years old, ask your health care provider if you should take aspirin to prevent strokes.  . Use sunscreen. Apply sunscreen  liberally and repeatedly throughout the day. You should seek shade when your shadow is shorter than you. Protect yourself by wearing long sleeves, pants, a wide-brimmed hat, and sunglasses year round, whenever you are outdoors.  . Once a month, do a whole body skin exam, using a mirror to look at the skin on your back. Tell your health care provider of new moles, moles that have irregular borders, moles that are larger than a pencil eraser, or moles that have changed in shape or color.

## 2017-08-07 DIAGNOSIS — Z961 Presence of intraocular lens: Secondary | ICD-10-CM | POA: Diagnosis not present

## 2017-08-07 DIAGNOSIS — H35342 Macular cyst, hole, or pseudohole, left eye: Secondary | ICD-10-CM | POA: Diagnosis not present

## 2017-08-07 DIAGNOSIS — H353111 Nonexudative age-related macular degeneration, right eye, early dry stage: Secondary | ICD-10-CM | POA: Diagnosis not present

## 2017-08-07 DIAGNOSIS — H43813 Vitreous degeneration, bilateral: Secondary | ICD-10-CM | POA: Diagnosis not present

## 2017-08-07 DIAGNOSIS — H353221 Exudative age-related macular degeneration, left eye, with active choroidal neovascularization: Secondary | ICD-10-CM | POA: Diagnosis not present

## 2017-08-08 ENCOUNTER — Other Ambulatory Visit: Payer: Self-pay

## 2017-08-08 ENCOUNTER — Ambulatory Visit (INDEPENDENT_AMBULATORY_CARE_PROVIDER_SITE_OTHER): Payer: Medicare Other | Admitting: Family Medicine

## 2017-08-08 ENCOUNTER — Encounter: Payer: Self-pay | Admitting: Family Medicine

## 2017-08-08 VITALS — BP 120/70 | HR 92 | Temp 97.7°F | Ht 66.5 in | Wt 251.0 lb

## 2017-08-08 DIAGNOSIS — Z0000 Encounter for general adult medical examination without abnormal findings: Secondary | ICD-10-CM | POA: Diagnosis not present

## 2017-08-08 MED ORDER — TAMSULOSIN HCL 0.4 MG PO CAPS: 0.4000 mg | ORAL_CAPSULE | Freq: Every day | ORAL | 1 refills | Status: DC

## 2017-08-08 MED ORDER — MECLIZINE HCL 25 MG PO TABS: 25.0000 mg | ORAL_TABLET | Freq: Three times a day (TID) | ORAL | 2 refills | Status: DC | PRN

## 2017-08-08 NOTE — Progress Notes (Signed)
Dr. Frederico Hamman T. Brylyn Novakovich, MD, [facility] [place], [address] Phone: [phone] Fax: [phone]  08/08/2017  Patient: Hunter Young, MRN: [id number], DOB: 1940/07/29, 77 y.o.  Primary Physician:  Owens Loffler, MD   Chief Complaint  Patient presents with  . Annual Exam   Subjective:   Hunter Young is a 77 y.o. pleasant patient who presents with the following:  Preventative Health Maintenance Visit:  Health Maintenance Summary Reviewed and updated, unless pt declines services.  Tobacco History Reviewed. Alcohol: 1-4 drinks a night, about 3 shots each Exercise Habits: Some activity, rec at least 30 mins 5 times a week STD concerns: no risk or activity to increase risk Drug Use: None Encouraged self-testicular check  ?oxybutynin  flomax  Knees hurting some  Health Maintenance  Topic Date Due  . COLONOSCOPY  08/26/2017  . INFLUENZA VACCINE  10/25/2017  . TETANUS/TDAP  07/23/2018  . PNA vac Low Risk Adult  Completed   Immunization History  Administered Date(s) Administered  . Hepatitis B 07/22/2008  . Influenza Split 01/19/2011, 12/21/2011  . Influenza Whole 01/10/2007, 12/25/2008  . Influenza, High Dose Seasonal PF 01/10/2017  . Influenza,inj,Quad PF,6+ Mos 12/16/2012, 01/28/2014, 02/11/2015, 11/25/2015  . Pneumococcal Conjugate-13 01/28/2014  . Pneumococcal Polysaccharide-23 03/27/2004, 01/19/2011  . Td 03/27/1998, 07/22/2008   Patient Active Problem List   Diagnosis Date Noted  . Stenosis of right carotid artery 01/29/2014  . Severe obesity (BMI >= 40) ([place]) 04/02/2013  . CERVICAL DISORDER, NOS 10/14/2009  . GERD 12/06/2007  . BPH (benign prostatic hypertrophy) with urinary obstruction 06/25/2007  . OSTEOARTHRITIS 06/25/2007  . CARDIOMEGALY 05/30/2007  . Allergic rhinitis 04/22/2007  . VENOUS INSUFFICIENCY 12/04/2006  . Hyperlipidemia LDL goal <70 12/03/2006  . Essential hypertension  12/03/2006  . Coronary artery disease involving native coronary artery without angina pectoris 12/03/2006  . DIVERTICULOSIS OF COLON 05/20/2001   Past Medical History:  Diagnosis Date  . ALLERGIC RHINITIS 04/22/2007  . ASTHMA, INTRINSIC NOS 12/27/2006  . BENIGN PROSTATIC HYPERTROPHY, WITH URINARY OBSTRUCTION 06/25/2007  . Cardiomegaly 05/30/2007  . Carotid stenosis 01/29/2014  . CERVICAL DISORDER, NOS 10/14/2009  . CORONARY ARTERY DISEASE 12/03/2006  . DIVERTICULOSIS OF COLON 05/20/2001  . Dyshidrosis 06/25/2007  . GERD 12/06/2007  . HYPERLIPIDEMIA 12/03/2006  . HYPERTENSION 12/03/2006  . INTERNAL HEMORRHOIDS 05/20/2001  . OBESITY, MODERATE 12/05/2008  . OSTEOARTHRITIS 06/25/2007  . VENOUS INSUFFICIENCY 12/04/2006   Past Surgical History:  Procedure Laterality Date  . ANGIOPLASTY     2 stents  . FOOT SURGERY    . JOINT REPLACEMENT     bilat knee  . TONSILLECTOMY     Social History   Socioeconomic History  . Marital status: Married    Spouse name: Not on file  . Number of children: Not on file  . Years of education: Not on file  . Highest education level: Not on file  Occupational History  . Not on file  Social Needs  . Financial resource strain: Not on file  . Food insecurity:    Worry: Not on file    Inability: Not on file  . Transportation needs:    Medical: Not on file    Non-medical: Not on file  Tobacco Use  . Smoking status: Former Smoker    Last attempt to quit: 08/21/1980    Years since quitting: 36.9  . Smokeless tobacco: Never Used  Substance and Sexual Activity  . Alcohol use: Yes  Alcohol/week: 12.6 oz    Types: 21 Standard drinks or equivalent per week  . Drug use: No  . Sexual activity: Not Currently  Lifestyle  . Physical activity:    Days per week: Not on file    Minutes per session: Not on file  . Stress: Not on file  Relationships  . Social connections:    Talks on phone: Not on file    Gets together: Not on file    Attends religious service: Not on  file    Active member of club or organization: Not on file    Attends meetings of clubs or organizations: Not on file    Relationship status: Not on file  . Intimate partner violence:    Fear of current or ex partner: Not on file    Emotionally abused: Not on file    Physically abused: Not on file    Forced sexual activity: Not on file  Other Topics Concern  . 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       Works part time for [employer].    Runs hub to [place], [place]. 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    In trucking business for a long time.   Family History  Problem Relation Age of Onset  . Colon cancer Mother 62   Allergies  Allergen Reactions  . Oxycodone-Aspirin Nausea And Vomiting    Medication list has been reviewed and updated.   General: Denies fever, chills, sweats. No significant weight loss. Eyes: Denies blurring,significant itching ENT: Denies earache, sore throat, and hoarseness. Cardiovascular: Denies chest pains, palpitations, dyspnea on exertion Respiratory: Denies cough, dyspnea at rest,wheeezing Breast: no concerns about lumps GI: Denies nausea, vomiting, diarrhea, constipation, change in bowel habits, abdominal pain, melena, hematochezia GU: increased outflow at night, 3-4 x a day at night Musculoskeletal: Denies back pain, joint pain Derm: Denies rash, itching Neuro: Denies  paresthesias, frequent falls, frequent headaches Psych: Denies depression, anxiety Endocrine: Denies cold intolerance, heat intolerance, polydipsia Heme: Denies enlarged lymph nodes Allergy: No hayfever  Objective:   BP 120/70   Pulse 92   Temp 97.7 F (36.5 C) (Oral)   Ht 5' 6.5" (1.689 m)   Wt 251 lb (113.9 kg)   BMI 39.91 kg/m  Ideal Body Weight: Weight in (lb) to have BMI = 25: 156.9  No exam data present  GEN: well developed, well nourished, no acute distress Eyes: conjunctiva and lids normal, PERRLA, EOMI ENT: TM  clear, nares clear, oral exam WNL Neck: supple, no lymphadenopathy, no thyromegaly, no JVD Pulm: clear to auscultation and percussion, respiratory effort normal CV: regular rate and rhythm, S1-S2, no murmur, rub or gallop, no bruits, peripheral pulses normal and symmetric, no cyanosis, clubbing, edema or varicosities GI: soft, non-tender; no hepatosplenomegaly, masses; active bowel sounds all quadrants GU: no hernia, testicular mass, penile discharge Lymph: no cervical, axillary or inguinal adenopathy MSK: gait normal, muscle tone and strength WNL, no joint swelling, effusions, discoloration, crepitus  SKIN: clear, good turgor, color WNL, no rashes, lesions, or ulcerations Neuro: normal mental status, normal strength, sensation, and motion Psych: alert; oriented to person, place and time, normally interactive and not anxious or depressed in appearance. All labs reviewed with patient.  Lipids:    Component Value Date/Time   CHOL 152 07/31/2017 0847   CHOL 161 06/11/2014 0759   TRIG 64.0 07/31/2017 0847   HDL 56.60 07/31/2017 0847  HDL 68 06/11/2014 0759   VLDL 12.8 07/31/2017 0847   CHOLHDL 3 07/31/2017 0847   CBC: CBC Latest Ref Rng & Units 07/31/2017 07/26/2016 07/19/2015  WBC 4.0 - 10.5 K/uL 7.0 6.6 7.4  Hemoglobin 13.0 - 17.0 g/dL 14.5 15.2 14.1  Hematocrit 39.0 - 52.0 % 44.3 46.1 43.5  Platelets 150.0 - 400.0 K/uL 163.0 187.0 115.7    Basic Metabolic Panel:    Component Value Date/Time   NA 140 07/31/2017 0847   K 4.8 07/31/2017 0847   CL 105 07/31/2017 0847   CO2 27 07/31/2017 0847   BUN 20 07/31/2017 0847   CREATININE 0.86 07/31/2017 0847   GLUCOSE 92 07/31/2017 0847   CALCIUM 9.4 07/31/2017 0847   Hepatic Function Latest Ref Rng & Units 07/31/2017 07/26/2016 07/19/2015  Total Protein 6.0 - 8.3 g/dL 6.3 6.6 6.7  Albumin 3.5 - 5.2 g/dL 3.7 3.9 3.8  AST 0 - 37 U/L _0 ALT 0 - 53 U/L _1 Alk Phosphatase 39 - 117 U/L 71 85 70  Total Bilirubin 0.2 - 1.2 mg/dL 0.7  0.7 0.5  Bilirubin, Direct 0.0 - 0.3 mg/dL 0.2 0.1 0.1    Lab Results  Component Value Date   TSH 1.43 07/31/2017   Lab Results  Component Value Date   PSA 0.39 07/31/2017   PSA 0.33 07/26/2016   PSA 0.37 01/26/2014    Assessment and Plan:   Healthcare maintenance  D/c ditropan and trial of flomax  Health Maintenance Exam: The patient's preventative maintenance and recommended screening tests for an annual wellness exam were reviewed in full today. Brought up to date unless services declined.  Counselled on the importance of diet, exercise, and its role in overall health and mortality. The patient's FH and SH was reviewed, including their home life, tobacco status, and drug and alcohol status.  Follow-up in 1 year for physical exam or additional follow-up below.  Follow-up: No follow-ups on file. Or follow-up in 1 year if not noted.  Meds ordered this encounter  Medications  . meclizine (ANTIVERT) 25 MG tablet    Sig: Take 1 tablet (25 mg total) by mouth 3 (three) times daily as needed for dizziness.    Dispense:  30 tablet    Refill:  2  . tamsulosin (FLOMAX) 0.4 MG CAPS capsule    Sig: Take 1 capsule (0.4 mg total) by mouth daily.    Dispense:  90 capsule    Refill:  1   Medications Discontinued During This Encounter  Medication Reason  . meclizine (ANTIVERT) 25 MG tablet Reorder  . oxybutynin (DITROPAN) 5 MG tablet     Signed,  Robel Wuertz T. Erlinda Solinger, MD   Allergies as of 08/08/2017      Reactions   Oxycodone-aspirin Nausea And Vomiting      Medication List        Accurate as of 08/08/17  8:51 AM. Always use your most recent med list.          aspirin 81 MG tablet Take 81 mg by mouth daily.   atorvastatin 40 MG tablet Commonly known as:  LIPITOR TAKE 1 TABLET(40 MG) BY MOUTH DAILY   lisinopril 40 MG tablet Commonly known as:  PRINIVIL,ZESTRIL TAKE 1 TABLET(40 MG) BY MOUTH DAILY   meclizine 25 MG tablet Commonly known as:  ANTIVERT Take 1  tablet (25 mg total) by mouth 3 (three) times daily as needed for dizziness.   multivitamin tablet Take 1 tablet by  mouth daily.   nitroGLYCERIN 0.4 MG SL tablet Commonly known as:  NITROSTAT Place 1 tablet (0.4 mg total) under the tongue every 5 (five) minutes as needed.   PRESERVISION AREDS 2 PO Take 2 capsules by mouth daily.   tamsulosin 0.4 MG Caps capsule Commonly known as:  FLOMAX Take 1 capsule (0.4 mg total) by mouth daily.

## 2017-08-10 ENCOUNTER — Other Ambulatory Visit: Payer: Self-pay | Admitting: Family Medicine

## 2017-08-23 NOTE — Progress Notes (Signed)
I reviewed health advisor's note, was available for consultation, and agree with documentation and plan.  

## 2017-09-11 ENCOUNTER — Other Ambulatory Visit: Payer: Self-pay | Admitting: Family Medicine

## 2017-09-18 DIAGNOSIS — H353110 Nonexudative age-related macular degeneration, right eye, stage unspecified: Secondary | ICD-10-CM | POA: Diagnosis not present

## 2017-09-18 DIAGNOSIS — H43392 Other vitreous opacities, left eye: Secondary | ICD-10-CM | POA: Diagnosis not present

## 2017-09-18 DIAGNOSIS — H43811 Vitreous degeneration, right eye: Secondary | ICD-10-CM | POA: Diagnosis not present

## 2017-09-18 DIAGNOSIS — H353221 Exudative age-related macular degeneration, left eye, with active choroidal neovascularization: Secondary | ICD-10-CM | POA: Diagnosis not present

## 2017-09-18 DIAGNOSIS — H35342 Macular cyst, hole, or pseudohole, left eye: Secondary | ICD-10-CM | POA: Diagnosis not present

## 2017-10-09 ENCOUNTER — Ambulatory Visit: Payer: Self-pay | Admitting: *Deleted

## 2017-10-09 NOTE — Telephone Encounter (Signed)
Patient said that he has been dizzy with a headache over the last week. Patient said that he has had vertigo in the past. He said he is fine when he is sitting down.     Patient is calling to report that he is having increased dizziness. He does have a diagnosis of vertigo- but the medication is not helping him. He states he is getting swimmy headed and has an accompanying headache. This has been going on for 1 week without relief. Appointment made for evaluation. Reason for Disposition . [1] MODERATE dizziness (e.g., interferes with normal activities) AND [2] has NOT been evaluated by physician for this  (Exception: dizziness caused by heat exposure, sudden standing, or poor fluid intake)  Answer Assessment - Initial Assessment Questions 1. DESCRIPTION: "Describe your dizziness."     More like a headache- swimmy headed 2. LIGHTHEADED: "Do you feel lightheaded?" (e.g., somewhat faint, woozy, weak upon standing)     Worse when standing and walking 3. VERTIGO: "Do you feel like either you or the room is spinning or tilting?" (i.e. vertigo)     Patient has had vertigo in the past- medication has not been helping 4. SEVERITY: "How bad is it?"  "Do you feel like you are going to faint?" "Can you stand and walk?"   - MILD - walking normally   - MODERATE - interferes with normal activities (e.g., work, school)    - SEVERE - unable to stand, requires support to walk, feels like passing out now.      moderate 5. ONSET:  "When did the dizziness begin?"     1 week 6. AGGRAVATING FACTORS: "Does anything make it worse?" (e.g., standing, change in head position)     Standing/walking 7. HEART RATE: "Can you tell me your heart rate?" "How many beats in 15 seconds?"  (Note: not all patients can do this)       no 8. CAUSE: "What do you think is causing the dizziness?"     unsure 9. RECURRENT SYMPTOM: "Have you had dizziness before?" If so, ask: "When was the last time?" "What happened that time?"     Yes-  hx vertigo medication not helping 10. OTHER SYMPTOMS: "Do you have any other symptoms?" (e.g., fever, chest pain, vomiting, diarrhea, bleeding)       no 11. PREGNANCY: "Is there any chance you are pregnant?" "When was your last menstrual period?"       n/a  Protocols used: DIZZINESS [facility]

## 2017-10-10 ENCOUNTER — Ambulatory Visit (INDEPENDENT_AMBULATORY_CARE_PROVIDER_SITE_OTHER): Payer: Medicare Other | Admitting: Family Medicine

## 2017-10-10 ENCOUNTER — Encounter: Payer: Self-pay | Admitting: Family Medicine

## 2017-10-10 VITALS — BP 118/72 | HR 82 | Temp 97.6°F | Ht 66.5 in | Wt 251.8 lb

## 2017-10-10 DIAGNOSIS — R51 Headache: Secondary | ICD-10-CM

## 2017-10-10 DIAGNOSIS — R2990 Unspecified symptoms and signs involving the nervous system: Secondary | ICD-10-CM | POA: Diagnosis not present

## 2017-10-10 DIAGNOSIS — G3281 Cerebellar ataxia in diseases classified elsewhere: Secondary | ICD-10-CM | POA: Diagnosis not present

## 2017-10-10 DIAGNOSIS — R29818 Other symptoms and signs involving the nervous system: Secondary | ICD-10-CM | POA: Diagnosis not present

## 2017-10-10 DIAGNOSIS — R519 Headache, unspecified: Secondary | ICD-10-CM

## 2017-10-10 LAB — BASIC METABOLIC PANEL
BUN: 17 mg/dL (ref 6–23)
CO2: 26 mEq/L (ref 19–32)
Calcium: 9.3 mg/dL (ref 8.4–10.5)
Chloride: 106 mEq/L (ref 96–112)
Creatinine, Ser: 1.01 mg/dL (ref 0.40–1.50)
GFR: 75.69 mL/min (ref >60.00)
Glucose, Bld: 89 mg/dL (ref 70–99)
POTASSIUM: 4.1 meq/L (ref 3.5–5.1)
SODIUM: 138 meq/L (ref 135–145)

## 2017-10-10 NOTE — Progress Notes (Signed)
Dr. Frederico Hamman T. Coni Homesley, MD, [facility] [place], [address] Phone: [phone] Fax: [phone]  10/10/2017  Patient: Hunter Young, MRN: [id number], DOB: 03-30-1940, 77 y.o.  Primary Physician:  Owens Loffler, MD   Chief Complaint  Patient presents with  . Dizziness    headache, history of vertigo   Subjective:   Hunter Young is a 77 y.o. very pleasant male patient who presents with the following:  About 2 weeks ago, feels like half drunk and has a headache for 2 weeks. Not as bad as it was. Has a headache more on th elateral side of the head.  History is significant for a prior history of vertigo, but the patient also has coronary disease, cardiomegaly, carotid stenosis, hyperlipidemia, hypertension. Body mass index is 40.02 kg/m.   Feels dizzy with getting up. Meclizine not helping at all.  He did have some dizziness with movement.  His primary problem is his ability to walk which is dramatically changed in the last 2 weeks, and he is having trouble with walking.  Is also had a left-sided headache.  Stopped his flomax and on oxybutynin.   L hand and arm Off balance - stumbling  Past Medical History, Surgical History, Social History, Family History, Problem List, Medications, and Allergies have been reviewed and updated if relevant.  Patient Active Problem List   Diagnosis Date Noted  . Stenosis of right carotid artery 01/29/2014  . Severe obesity (BMI >= 40) ([place]) 04/02/2013  . CERVICAL DISORDER, NOS 10/14/2009  . GERD 12/06/2007  . BPH (benign prostatic hypertrophy) with urinary obstruction 06/25/2007  . OSTEOARTHRITIS 06/25/2007  . CARDIOMEGALY 05/30/2007  . Allergic rhinitis 04/22/2007  . VENOUS INSUFFICIENCY 12/04/2006  . Hyperlipidemia LDL goal <70 12/03/2006  . Essential hypertension 12/03/2006  . Coronary artery disease involving native coronary artery without angina pectoris 12/03/2006  .  DIVERTICULOSIS OF COLON 05/20/2001    Past Medical History:  Diagnosis Date  . ALLERGIC RHINITIS 04/22/2007  . ASTHMA, INTRINSIC NOS 12/27/2006  . BENIGN PROSTATIC HYPERTROPHY, WITH URINARY OBSTRUCTION 06/25/2007  . Cardiomegaly 05/30/2007  . Carotid stenosis 01/29/2014  . CERVICAL DISORDER, NOS 10/14/2009  . CORONARY ARTERY DISEASE 12/03/2006  . DIVERTICULOSIS OF COLON 05/20/2001  . Dyshidrosis 06/25/2007  . GERD 12/06/2007  . HYPERLIPIDEMIA 12/03/2006  . HYPERTENSION 12/03/2006  . INTERNAL HEMORRHOIDS 05/20/2001  . OBESITY, MODERATE 12/05/2008  . OSTEOARTHRITIS 06/25/2007  . VENOUS INSUFFICIENCY 12/04/2006    Past Surgical History:  Procedure Laterality Date  . ANGIOPLASTY     2 stents  . FOOT SURGERY    . JOINT REPLACEMENT     bilat knee  . TONSILLECTOMY      Social History   Socioeconomic History  . Marital status: Married    Spouse name: Not on file  . Number of children: Not on file  . Years of education: Not on file  . Highest education level: Not on file  Occupational History  . Not on file  Social Needs  . Financial resource strain: Not on file  . Food insecurity:    Worry: Not on file    Inability: Not on file  . Transportation needs:    Medical: Not on file    Non-medical: Not on file  Tobacco Use  . Smoking status: Former Smoker    Last attempt to quit: 08/21/1980    Years since quitting: 37.1  . Smokeless tobacco: Never Used  Substance and Sexual  Activity  . Alcohol use: Yes    Alcohol/week: 12.6 oz    Types: 21 Standard drinks or equivalent per week  . Drug use: No  . Sexual activity: Not Currently  Lifestyle  . Physical activity:    Days per week: Not on file    Minutes per session: Not on file  . Stress: Not on file  Relationships  . Social connections:    Talks on phone: Not on file    Gets together: Not on file    Attends religious service: Not on file    Active member of club or organization: Not on file    Attends meetings of clubs or  organizations: Not on file    Relationship status: Not on file  . Intimate partner violence:    Fear of current or ex partner: Not on file    Emotionally abused: Not on file    Physically abused: Not on file    Forced sexual activity: Not on file  Other Topics Concern  . 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       Works part time for [employer].    Runs hub to [place], [place]. 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    In trucking business for a long time.    Family History  Problem Relation Age of Onset  . Colon cancer Mother 41    Allergies  Allergen Reactions  . Oxycodone-Aspirin Nausea And Vomiting    Medication list reviewed and updated in full in [place].   GEN: No acute illnesses, no fevers, chills. GI: No n/v/d, eating normally Pulm: No SOB Interactive and getting along well at home.  Otherwise, ROS is as per the HPI.  Objective:   BP 100/60   Pulse 88   Temp 97.6 F (36.4 C) (Oral)   Ht 5' 6.5" (1.689 m)   Wt 251 lb 12 oz (114.2 kg)   BMI 40.02 kg/m    GEN: WDWN, NAD, Non-toxic, A & O x 3 HEENT: Atraumatic, Normocephalic. Neck supple. No masses, No LAD. Ears and Nose: No external deformity. CV: RRR, No M/G/R. No JVD. No thrill. No extra heart sounds. PULM: CTA B, no wheezes, crackles, rhonchi. No retractions. No resp. distress. No accessory muscle use. ABD: S, NT, ND, +BS. No rebound tenderness. No HSM.  EXTR: No c/c/e  Neuro: CN 2-12 grossly intact. PERRLA. EOMI. Sensation intact throughout. Str 5/5 all extremities. DTR 2+. No clonus. A and o x 4. Romberg neg. Finger nose abnormal on the left. Heel -shin pos. Unable to walk a straight line.   PSYCH: Normally interactive. Conversant. Not depressed or anxious appearing.  Calm demeanor.     Laboratory and Imaging Data: Recent Results (from the past 2160 hour(s))  TSH     Status: None   Collection Time: 07/31/17  8:47 AM  Result Value Ref Range    TSH 1.43 0.35 - 4.50 uIU/mL  Basic Metabolic Panel     Status: None   Collection Time: 07/31/17  8:47 AM  Result Value Ref Range   Sodium 140 135 - 145 mEq/L   Potassium 4.8 3.5 - 5.1 mEq/L   Chloride 105 96 - 112 mEq/L   CO2 27 19 - 32 mEq/L   Glucose, Bld 92 70 - 99 mg/dL   BUN 20 6 - 23 mg/dL   Creatinine, Ser 0.86 0.40 - 1.50 mg/dL   Calcium 9.4  8.4 - 10.5 mg/dL   GFR 91.17 >60.00 mL/min  PSA, Medicare ([place])     Status: None   Collection Time: 07/31/17  8:47 AM  Result Value Ref Range   PSA 0.39 0.10 - 4.00 ng/ml    Comment: Test performed using Access Hybritech PSA Assay, a parmagnetic partical, chemiluminecent immunoassay.  Hepatic Function Panel     Status: None   Collection Time: 07/31/17  8:47 AM  Result Value Ref Range   Total Bilirubin 0.7 0.2 - 1.2 mg/dL   Bilirubin, Direct 0.2 0.0 - 0.3 mg/dL   Alkaline Phosphatase 71 39 - 117 U/L   AST 20 0 - 37 U/L   ALT 15 0 - 53 U/L   Total Protein 6.3 6.0 - 8.3 g/dL   Albumin 3.7 3.5 - 5.2 g/dL  CBC with Differential/Platelet     Status: Abnormal   Collection Time: 07/31/17  8:47 AM  Result Value Ref Range   WBC 7.0 4.0 - 10.5 K/uL   RBC 4.99 4.22 - 5.81 Mil/uL   Hemoglobin 14.5 13.0 - 17.0 g/dL   HCT 44.3 39.0 - 52.0 %   MCV 88.9 78.0 - 100.0 fl   MCHC 32.6 30.0 - 36.0 g/dL   RDW 14.4 11.5 - 15.5 %   Platelets 163.0 150.0 - 400.0 K/uL   Neutrophils Relative % 54.9 43.0 - 77.0 %   Lymphocytes Relative 29.1 12.0 - 46.0 %   Monocytes Relative 12.8 (H) 3.0 - 12.0 %   Eosinophils Relative 2.9 0.0 - 5.0 %   Basophils Relative 0.3 0.0 - 3.0 %   Neutro Abs 3.9 1.4 - 7.7 K/uL   Lymphs Abs 2.1 0.7 - 4.0 K/uL   Monocytes Absolute 0.9 0.1 - 1.0 K/uL   Eosinophils Absolute 0.2 0.0 - 0.7 K/uL   Basophils Absolute 0.0 0.0 - 0.1 K/uL  Lipid Panel     Status: None   Collection Time: 07/31/17  8:47 AM  Result Value Ref Range   Cholesterol 152 0 - 200 mg/dL    Comment: ATP III Classification       Desirable:  < 200 mg/dL                Borderline High:  200 - 239 mg/dL          High:  > = 240 mg/dL   Triglycerides 64.0 0.0 - 149.0 mg/dL    Comment: Normal:  <150 mg/dLBorderline High:  150 - 199 mg/dL   HDL 56.60 >39.00 mg/dL   VLDL 12.8 0.0 - 40.0 mg/dL   LDL Cholesterol 82 0 - 99 mg/dL   Total CHOL/HDL Ratio 3     Comment:                Men          Women1/2 Average Risk     3.4          3.3Average Risk          5.0          4.42X Average Risk          9.6          7.13X Average Risk          15.0          11.0                       NonHDL 95.00     Comment: NOTE:  Non-HDL  goal should be 30 mg/dL higher than patient's LDL goal (i.e. LDL goal of < 70 mg/dL, would have non-HDL goal of < 100 mg/dL)     Assessment and Plan:   Cerebellar ataxia in diseases classified elsewhere ([place]) - Plan: MR Brain W Wo Contrast, Basic metabolic panel  Other symptoms and signs involving the nervous system - Plan: MR Brain W Wo Contrast, Basic metabolic panel  New onset headache - Plan: MR Brain W Wo Contrast, Basic metabolic panel  Abnormal neurological exam - Plan: Basic metabolic panel  Abnormal neurological exam with abnormal gait, ataxia, abnormal finger-nose test, in a patient with high risk and prior known cardiac disease, carotid stenosis, and we will obtain an MRI of the brain with and without contrast to evaluate for neoplasm as well as stroke.  Demyelinating disease would also be in the differential.  Symptoms have been present now for 2 weeks.  Follow-up: No follow-ups on file.  Orders Placed This Encounter  Procedures  . MR Brain W Wo Contrast  . Basic metabolic panel    Signed,  Frederico Hamman T. Jennae Hakeem, MD   Allergies as of 10/10/2017      Reactions   Oxycodone-aspirin Nausea And Vomiting      Medication List        Accurate as of 10/10/17  1:57 PM. Always use your most recent med list.          aspirin 81 MG tablet Take 81 mg by mouth daily.   atorvastatin 40 MG tablet Commonly known as:   LIPITOR TAKE 1 TABLET(40 MG) BY MOUTH DAILY   lisinopril 40 MG tablet Commonly known as:  PRINIVIL,ZESTRIL TAKE 1 TABLET(40 MG) BY MOUTH DAILY   meclizine 25 MG tablet Commonly known as:  ANTIVERT Take 1 tablet (25 mg total) by mouth 3 (three) times daily as needed for dizziness.   multivitamin tablet Take 1 tablet by mouth daily.   nitroGLYCERIN 0.4 MG SL tablet Commonly known as:  NITROSTAT Place 1 tablet (0.4 mg total) under the tongue every 5 (five) minutes as needed.   oxybutynin 5 MG tablet Commonly known as:  DITROPAN Take 5 mg by mouth 3 (three) times daily.   PRESERVISION AREDS 2 PO Take 2 capsules by mouth daily.   tamsulosin 0.4 MG Caps capsule Commonly known as:  FLOMAX Take 1 capsule (0.4 mg total) by mouth daily.

## 2017-10-10 NOTE — Patient Instructions (Signed)
REFERRALS TO SPECIALISTS, SPECIAL TESTS (MRI, CT, ULTRASOUNDS)  MARION or  Anastasiya will help you. ASK CHECK-IN FOR HELP.  Specialist appointment times vary a great deal, based on their schedule / openings. -- Some specialists have very long wait times. (Example. Dermatology)    

## 2017-10-17 ENCOUNTER — Telehealth: Payer: Self-pay

## 2017-10-17 ENCOUNTER — Other Ambulatory Visit: Payer: Self-pay | Admitting: Family Medicine

## 2017-10-17 ENCOUNTER — Ambulatory Visit (HOSPITAL_COMMUNITY)
Admission: RE | Admit: 2017-10-17 | Discharge: 2017-10-17 | Disposition: A | Discharge status: Home / Self Care | Payer: Medicare Other | Source: Ambulatory Visit | Attending: Family Medicine | Admitting: Family Medicine

## 2017-10-17 DIAGNOSIS — R51 Headache: Secondary | ICD-10-CM | POA: Diagnosis not present

## 2017-10-17 DIAGNOSIS — I739 Peripheral vascular disease, unspecified: Secondary | ICD-10-CM | POA: Insufficient documentation

## 2017-10-17 DIAGNOSIS — R519 Headache, unspecified: Secondary | ICD-10-CM

## 2017-10-17 DIAGNOSIS — I639 Cerebral infarction, unspecified: Secondary | ICD-10-CM | POA: Diagnosis not present

## 2017-10-17 DIAGNOSIS — R29818 Other symptoms and signs involving the nervous system: Secondary | ICD-10-CM | POA: Diagnosis not present

## 2017-10-17 DIAGNOSIS — G3281 Cerebellar ataxia in diseases classified elsewhere: Secondary | ICD-10-CM | POA: Insufficient documentation

## 2017-10-17 MED ORDER — CLOPIDOGREL BISULFATE 75 MG PO TABS: 75.0000 mg | ORAL_TABLET | Freq: Every day | ORAL | 3 refills | Status: DC

## 2017-10-17 MED ORDER — GADOBENATE DIMEGLUMINE 529 MG/ML IV SOLN
20.0000 mL | Freq: Once | INTRAVENOUS | Status: AC | PRN
  Administered 2017-10-17: 20 mL via INTRAVENOUS

## 2017-10-17 NOTE — Telephone Encounter (Signed)
Called report for Hunter Young at [facility] for MRI of brain w/wo contrast. Report is in epic and report taken to Dr Serita Grit area.

## 2017-10-17 NOTE — Progress Notes (Signed)
Acute stroke  D/c asa Start plavix  Mr Jeri Cos Wo Contrast  Result Date: 10/17/2017 CLINICAL DATA:  Ataxia gait disturbance, new onset headache. EXAM: MRI HEAD WITHOUT AND WITH CONTRAST TECHNIQUE: Multiplanar, multiecho pulse sequences of the brain and surrounding structures were obtained without and with intravenous contrast. CONTRAST:  57mL MULTIHANCE GADOBENATE DIMEGLUMINE 529 MG/ML IV SOLN COMPARISON:  None. FINDINGS: Brain: Three small (2-3 mm) foci of restricted diffusion RIGHT occipital lobe cortex, corresponding low ADC, consistent with acute infarction. No hemorrhage, mass lesion, or extra-axial fluid. Tiny foci of chronic cerebellar infarction are noted. Generalized atrophy, hydrocephalus ex vacuo. Moderately advanced T2 and FLAIR hyperintensities in the periventricular and subcortical white matter, consistent with small vessel disease. Prominent perivascular spaces. No chronic hemorrhage. Post infusion, no abnormal enhancement of the brain or meninges. Vascular: Flow voids are maintained. Skull and upper cervical spine: Normal marrow signal. Sinuses/Orbits: No significant sinus disease. BILATERAL cataract extraction. Otherwise negative orbits. Other: No middle ear or mastoid fluid. IMPRESSION: Small foci of acute infarction are noted in the RIGHT occipital lobe see discussion above. No proximal vascular occlusion is evident. Shower of emboli is suspected. Generalized atrophy with moderate small vessel disease. No abnormal postcontrast enhancement. These results will be called to the ordering clinician or representative by the Radiologist Assistant, and communication documented in the PACS or zVision Dashboard. Electronically Signed   By: Staci Righter M.D.   On: 10/17/2017 14:02    US carotid Echo

## 2017-10-17 NOTE — Telephone Encounter (Signed)
reviewed

## 2017-11-01 ENCOUNTER — Ambulatory Visit (INDEPENDENT_AMBULATORY_CARE_PROVIDER_SITE_OTHER): Payer: Medicare Other

## 2017-11-01 ENCOUNTER — Other Ambulatory Visit: Payer: Self-pay

## 2017-11-01 ENCOUNTER — Ambulatory Visit (INDEPENDENT_AMBULATORY_CARE_PROVIDER_SITE_OTHER): Payer: Medicare Other | Admitting: *Deleted

## 2017-11-01 VITALS — BP 110/78 | HR 71 | Ht 66.5 in | Wt 249.5 lb

## 2017-11-01 DIAGNOSIS — I639 Cerebral infarction, unspecified: Secondary | ICD-10-CM

## 2017-11-01 DIAGNOSIS — I4891 Unspecified atrial fibrillation: Secondary | ICD-10-CM | POA: Diagnosis not present

## 2017-11-01 MED ORDER — PERFLUTREN LIPID MICROSPHERE
1.0000 mL | INTRAVENOUS | Status: AC | PRN
  Administered 2017-11-01: 2 mL via INTRAVENOUS

## 2017-11-01 MED ORDER — APIXABAN 5 MG PO TABS: 5.0000 mg | ORAL_TABLET | Freq: Two times a day (BID) | ORAL | 0 refills | Status: DC

## 2017-11-01 NOTE — Progress Notes (Signed)
1.) Reason for visit: EKG  2.) Name of MD requesting visit: Arida (DOD)  3.) H&P: The patient is followed by Dr. Rockey Situ for a history of CAD, hypertension, hyperlipidemia, & mild carotid stenosis (0-39 % bilateral 07/24/2016). He was seen by Dr. Edilia Bo (PCP) on 10/10/17 with complaints of a headache and dizziness for 2 weeks prior. He was sent for an MRI of the brain (10/17/17) that showed an acute infarction in the right occipital lobe. He was started on plavix 75 mg once daily at that time and ASA was stopped per the patient. Dr. Edilia Bo ordered a follow up carotid US & echocardiogram. The patient was in our office today for these test to be done. Nursing was notified by the echo tech that the patient appeared to be in atrial fibrillation- echo tech also notified Dr. Fletcher Anon (DOD). 12 lead EKG was performed.  4.) ROS related to problem: The patient states his headache has resolved, but dizziness does persist. He mentioned that he felt more dizzy when he rose from the echo table and started to move around the room. He was able to ambulate to an exam room without difficulty.   5.) Assessment and plan per MD: EKG reviewed with Dr. Fletcher Anon he confirmed the patient is in atrial fibrillation. Orders received from Dr. Fletcher Anon to discontinue plavix and start eliquis 5 mg BID (per MD, he will take his first dose tomorrow morning as he had plavix today). He wants the patient to follow up next week. The patient was made aware of Dr. Tyrell Antonio recommendations and agreeable with the above. I reviewed atrial fibrillation with the patient and the role of Eliquis in regards to this. He voices understanding and is agreeable. He is scheduled to follow up on 11/07/17 at 3:40 pm with Dr. Rockey Situ.

## 2017-11-01 NOTE — Patient Instructions (Addendum)
Medication Instructions: - Your physician has recommended you make the following change in your medication:   1) STOP plavix 2) START eliquis 5 mg- take 1 tablet by mouth TWICE daily (start on 11/02/17 in the morning)  Samples given: Eliquis 5 mg Lot: [id number] Exp: 6/21 # 2 boxes  Labwork: - none ordered  Procedures/Testing: - none ordered  Follow-Up: - in 1 week: Wednesday 11/07/17 at 3:40 pm   Any Additional Special Instructions Will Be Listed Below (If Applicable).     If you need a refill on your cardiac medications before your next appointment, please call your pharmacy.   Atrial Fibrillation Atrial fibrillation is a type of irregular or rapid heartbeat (arrhythmia). In atrial fibrillation, the heart quivers continuously in a chaotic pattern. This occurs when parts of the heart receive disorganized signals that make the heart unable to pump blood normally. This can increase the risk for stroke, heart failure, and other heart-related conditions. There are different types of atrial fibrillation, including:  Paroxysmal atrial fibrillation. This type starts suddenly, and it usually stops on its own shortly after it starts.  Persistent atrial fibrillation. This type often lasts longer than a week. It may stop on its own or with treatment.  Long-lasting persistent atrial fibrillation. This type lasts longer than 12 months.  Permanent atrial fibrillation. This type does not go away.  Talk with your health care provider to learn about the type of atrial fibrillation that you have. What are the causes? This condition is caused by some heart-related conditions or procedures, including:  A heart attack.  Coronary artery disease.  Heart failure.  Heart valve conditions.  High blood pressure.  Inflammation of the sac that surrounds the heart (pericarditis).  Heart surgery.  Certain heart rhythm disorders, such as Wolf-Parkinson-White syndrome.  Other causes  include:  Pneumonia.  Obstructive sleep apnea.  Blockage of an artery in the lungs (pulmonary embolism, or PE).  Lung cancer.  Chronic lung disease.  Thyroid problems, especially if the thyroid is overactive (hyperthyroidism).  Caffeine.  Excessive alcohol use or illegal drug use.  Use of some medicines, including certain decongestants and diet pills.  Sometimes, the cause cannot be found. What increases the risk? This condition is more likely to develop in:  People who are older in age.  People who smoke.  People who have diabetes mellitus.  People who are overweight (obese).  Athletes who exercise vigorously.  What are the signs or symptoms? Symptoms of this condition include:  A feeling that your heart is beating rapidly or irregularly.  A feeling of discomfort or pain in your chest.  Shortness of breath.  Sudden light-headedness or weakness.  Getting tired easily during exercise.  In some cases, there are no symptoms. How is this diagnosed? Your health care provider may be able to detect atrial fibrillation when taking your pulse. If detected, this condition may be diagnosed with:  An electrocardiogram (ECG).  A Holter monitor test that records your heartbeat patterns over a 24-hour period.  Transthoracic echocardiogram (TTE) to evaluate how blood flows through your heart.  Transesophageal echocardiogram (TEE) to view more detailed images of your heart.  A stress test.  Imaging tests, such as a CT scan or chest X-ray.  Blood tests.  How is this treated? The main goals of treatment are to prevent blood clots from forming and to keep your heart beating at a normal rate and rhythm. The type of treatment that you receive depends on many factors, such as  your underlying medical conditions and how you feel when you are experiencing atrial fibrillation. This condition may be treated with:  Medicine to slow down the heart rate, bring the heart's rhythm  back to normal, or prevent clots from forming.  Electrical cardioversion. This is a procedure that resets your heart's rhythm by delivering a controlled, low-energy shock to the heart through your skin.  Different types of ablation, such as catheter ablation, catheter ablation with pacemaker, or surgical ablation. These procedures destroy the heart tissues that send abnormal signals. When the pacemaker is used, it is placed under your skin to help your heart beat in a regular rhythm.  Follow these instructions at home:  Take over-the counter and prescription medicines only as told by your health care provider.  If your health care provider prescribed a blood-thinning medicine (anticoagulant), take it exactly as told. Taking too much blood-thinning medicine can cause bleeding. If you do not take enough blood-thinning medicine, you will not have the protection that you need against stroke and other problems.  Do not use tobacco products, including cigarettes, chewing tobacco, and e-cigarettes. If you need help quitting, ask your health care provider.  If you have obstructive sleep apnea, manage your condition as told by your health care provider.  Do not drink alcohol.  Do not drink beverages that contain caffeine, such as coffee, soda, and tea.  Maintain a healthy weight. Do not use diet pills unless your health care provider approves. Diet pills may make heart problems worse.  Follow diet instructions as told by your health care provider.  Exercise regularly as told by your health care provider.  Keep all follow-up visits as told by your health care provider. This is important. How is this prevented?  Avoid drinking beverages that contain caffeine or alcohol.  Avoid certain medicines, especially medicines that are used for breathing problems.  Avoid certain herbs and herbal medicines, such as those that contain ephedra or ginseng.  Do not use illegal drugs, such as cocaine and  amphetamines.  Do not smoke.  Manage your high blood pressure. Contact a health care provider if:  You notice a change in the rate, rhythm, or strength of your heartbeat.  You are taking an anticoagulant and you notice increased bruising.  You tire more easily when you exercise or exert yourself. Get help right away if:  You have chest pain, abdominal pain, sweating, or weakness.  You feel nauseous.  You notice blood in your vomit, bowel movement, or urine.  You have shortness of breath.  You suddenly have swollen feet and ankles.  You feel dizzy.  You have sudden weakness or numbness of the face, arm, or leg, especially on one side of the body.  You have trouble speaking, trouble understanding, or both (aphasia).  Your face or your eyelid droops on one side. These symptoms may represent a serious problem that is an emergency. Do not wait to see if the symptoms will go away. Get medical help right away. Call your local emergency services (911 in the [place] Do not drive yourself to the hospital. This information is not intended to replace advice given to you by your health care provider. Make sure you discuss any questions you have with your health care provider. Document Released: 03/13/2005 Document Revised: 07/21/2015 Document Reviewed: 07/08/2014 Elsevier Interactive Patient Education  2018 [employer].  Apixaban oral tablets What is this medicine? APIXABAN (a PIX a ban) is an anticoagulant (blood thinner). It is used to lower the  chance of stroke in people with a medical condition called atrial fibrillation. It is also used to treat or prevent blood clots in the lungs or in the veins. This medicine may be used for other purposes; ask your health care provider or pharmacist if you have questions. COMMON BRAND NAME(S): Eliquis What should I tell my health care provider before I take this medicine? They need to know if you have any of these conditions: -bleeding  disorders -bleeding in the brain -blood in your stools (black or tarry stools) or if you have blood in your vomit -history of stomach bleeding -kidney disease -liver disease -mechanical heart valve -an unusual or allergic reaction to apixaban, other medicines, foods, dyes, or preservatives -pregnant or trying to get pregnant -breast-feeding How should I use this medicine? Take this medicine by mouth with a glass of water. Follow the directions on the prescription label. You can take it with or without food. If it upsets your stomach, take it with food. Take your medicine at regular intervals. Do not take it more often than directed. Do not stop taking except on your doctor's advice. Stopping this medicine may increase your risk of a blot clot. Be sure to refill your prescription before you run out of medicine. Talk to your pediatrician regarding the use of this medicine in children. Special care may be needed. Overdosage: If you think you have taken too much of this medicine contact a poison control center or emergency room at once. NOTE: This medicine is only for you. Do not share this medicine with others. What if I miss a dose? If you miss a dose, take it as soon as you can. If it is almost time for your next dose, take only that dose. Do not take double or extra doses. What may interact with this medicine? This medicine may interact with the following: -aspirin and aspirin-like medicines -certain medicines for fungal infections like ketoconazole and itraconazole -certain medicines for seizures like carbamazepine and phenytoin -certain medicines that treat or prevent blood clots like warfarin, enoxaparin, and dalteparin -clarithromycin -NSAIDs, medicines for pain and inflammation, like ibuprofen or naproxen -rifampin -ritonavir -St. John's wort This list may not describe all possible interactions. Give your health care provider a list of all the medicines, herbs, non-prescription  drugs, or dietary supplements you use. Also tell them if you smoke, drink alcohol, or use illegal drugs. Some items may interact with your medicine. What should I watch for while using this medicine? Visit your doctor or health care professional for regular checks on your progress. Notify your doctor or health care professional and seek emergency treatment if you develop breathing problems; changes in vision; chest pain; severe, sudden headache; pain, swelling, warmth in the leg; trouble speaking; sudden numbness or weakness of the face, arm or leg. These can be signs that your condition has gotten worse. If you are going to have surgery or other procedure, tell your doctor that you are taking this medicine. What side effects may I notice from receiving this medicine? Side effects that you should report to your doctor or health care professional as soon as possible: -allergic reactions like skin rash, itching or hives, swelling of the face, lips, or tongue -signs and symptoms of bleeding such as bloody or black, tarry stools; red or dark-brown urine; spitting up blood or brown material that looks like coffee grounds; red spots on the skin; unusual bruising or bleeding from the eye, gums, or nose This list may not describe all  possible side effects. Call your doctor for medical advice about side effects. You may report side effects to FDA at 1-800-FDA-1088. Where should I keep my medicine? Keep out of the reach of children. Store at room temperature between 20 and 25 degrees C (68 and 77 degrees F). Throw away any unused medicine after the expiration date. NOTE: This sheet is a summary. It may not cover all possible information. If you have questions about this medicine, talk to your doctor, pharmacist, or health care provider.  2018 Elsevier/Gold Standard (2015-10-04 11:54:23)

## 2017-11-02 NOTE — Progress Notes (Signed)
This patient came for an echocardiogram but was noted to be in atrial fibrillation with controlled ventricular rate.  Given his recent stroke, I elected to switch him from Plavix to Eliquis.

## 2017-11-07 ENCOUNTER — Encounter: Payer: Self-pay | Admitting: Cardiovascular Disease

## 2017-11-07 ENCOUNTER — Ambulatory Visit: Payer: Medicare Other | Admitting: Cardiovascular Disease

## 2017-11-07 VITALS — BP 90/60 | HR 88 | Ht 66.5 in | Wt 249.2 lb

## 2017-11-07 DIAGNOSIS — I10 Essential (primary) hypertension: Secondary | ICD-10-CM

## 2017-11-07 DIAGNOSIS — I25118 Atherosclerotic heart disease of native coronary artery with other forms of angina pectoris: Secondary | ICD-10-CM

## 2017-11-07 DIAGNOSIS — E785 Hyperlipidemia, unspecified: Secondary | ICD-10-CM

## 2017-11-07 DIAGNOSIS — I739 Peripheral vascular disease, unspecified: Secondary | ICD-10-CM | POA: Diagnosis not present

## 2017-11-07 DIAGNOSIS — I481 Persistent atrial fibrillation: Secondary | ICD-10-CM | POA: Diagnosis not present

## 2017-11-07 DIAGNOSIS — I4819 Other persistent atrial fibrillation: Secondary | ICD-10-CM

## 2017-11-07 DIAGNOSIS — I6310 Cerebral infarction due to embolism of unspecified precerebral artery: Secondary | ICD-10-CM

## 2017-11-07 MED ORDER — METOPROLOL SUCCINATE ER 25 MG PO TB24: 25.0000 mg | ORAL_TABLET | Freq: Every day | ORAL | 3 refills | Status: DC

## 2017-11-07 MED ORDER — APIXABAN 5 MG PO TABS: 5.0000 mg | ORAL_TABLET | Freq: Two times a day (BID) | ORAL | 11 refills | Status: DC

## 2017-11-07 NOTE — Progress Notes (Signed)
Cardiology Office Note  Date:  11/07/2017   ID:  Hunter Young, DOB [birth year], MRN [id number]  PCP:  Owens Loffler, MD   Chief Complaint  Patient presents with  . OTHER    AFIB c/o unbalance worse with exertion. Meds reviewed verbally with pt.    HPI:  Hunter Young is a very pleasant 77 year old gentleman with a history of  CAD stenting to his left circumflex, last catheterization in 2007 showing 40% proximal LAD disease, 70-80% mid RCA disease, nondominant vessel.  40 to 59% right carotid ultrasound in 2014 He presents for routine followup of his coronary artery disease, new diagnosis of atrial fibrillation  Seen in the office with Dr. Edilia Bo 10/10/2017 Had stroke type symptoms  speech, left hand and arm  Was sent for MRI MRI of the brain (10/17/17)  showed an acute infarction in the right occipital lobe Referred for echocardiogram Atrial fib noted on echo 11/01/2017 Started on eliquis 11/01/2017  Reports that he feels well except for chronic dizziness, orthostasis Blood pressure 90/60 on today's visit Meclizine not helping  Results reviewed with him in detail Echo 11/01/2017 - Left ventricle: The cavity size was normal. Wall thickness was increased in a pattern of mild LVH. Systolic function was mildly reduced. The estimated ejection fraction was in the range of 45% to 50%. Diffuse hypokinesis. Regional wall motion abnormalities cannot be excluded. - Mitral valve: There was mild regurgitation. - Left atrium: The atrium was moderately dilated. - Right ventricle: The cavity size was mildly dilated. Systolic function was mildly reduced.  No regular exercise program, weight continues to be an issue Wife who presents with him today reports that he snores Previously had sleep study results unclear He has claustrophobia Denies having significant leg swelling Denies any chest pain concerning for angina  Metoprolol was stopped in the past secondary to bradycardia. Stopped drinking  shots and wine several months ago  EKG personally reviewed by myself on todays visit Shows atrial fibrillation and tachycardia rate 88 bpm rare PVC   PMH:   has a past medical history of ALLERGIC RHINITIS (04/22/2007), ASTHMA, INTRINSIC NOS (12/27/2006), BENIGN PROSTATIC HYPERTROPHY, WITH URINARY OBSTRUCTION (06/25/2007), Cardiomegaly (05/30/2007), Carotid stenosis (01/29/2014), CERVICAL DISORDER, NOS (10/14/2009), CORONARY ARTERY DISEASE (12/03/2006), DIVERTICULOSIS OF COLON (05/20/2001), Dyshidrosis (06/25/2007), GERD (12/06/2007), HYPERLIPIDEMIA (12/03/2006), HYPERTENSION (12/03/2006), INTERNAL HEMORRHOIDS (05/20/2001), OBESITY, MODERATE (12/05/2008), OSTEOARTHRITIS (06/25/2007), and VENOUS INSUFFICIENCY (12/04/2006).  PSH:    Past Surgical History:  Procedure Laterality Date  . ANGIOPLASTY     2 stents  . FOOT SURGERY    . JOINT REPLACEMENT     bilat knee  . TONSILLECTOMY      Current Outpatient Medications  Medication Sig Dispense Refill  . apixaban (ELIQUIS) 5 MG TABS tablet Take 1 tablet (5 mg total) by mouth 2 (two) times daily. 28 tablet 0  . atorvastatin (LIPITOR) 40 MG tablet TAKE 1 TABLET(40 MG) BY MOUTH DAILY 93 tablet 3  . lisinopril (PRINIVIL,ZESTRIL) 40 MG tablet TAKE 1 TABLET(40 MG) BY MOUTH DAILY 90 tablet 1  . Multiple Vitamins-Minerals (PRESERVISION AREDS) TABS Take by mouth. Take 1 tablet by mouth twice daily    . nitroGLYCERIN (NITROSTAT) 0.4 MG SL tablet Place 1 tablet (0.4 mg total) under the tongue every 5 (five) minutes as needed. 25 tablet 6  . oxybutynin (DITROPAN) 5 MG tablet Take 1 tablet (5 mg) by mouth three times a day     No current facility-administered medications for this visit.      Allergies:  Oxycodone-aspirin   Social History:  The patient  reports that he quit smoking about 37 years ago. He has never used smokeless tobacco. He reports that he drinks about 21.0 standard drinks of alcohol per week. He reports that he does not use drugs.   Family History:    family history includes Colon cancer (age of onset: 28) in his mother.    Review of Systems: Review of Systems  Constitutional: Negative.   Respiratory: Negative.   Cardiovascular: Negative.   Gastrointestinal: Negative.   Musculoskeletal: Negative.   Neurological: Negative.   Psychiatric/Behavioral: Negative.   All other systems reviewed and are negative.    PHYSICAL EXAM: VS:  BP 90/60 (BP Location: Left Arm, Patient Position: Sitting, Cuff Size: Large)   Pulse 88   Ht 5' 6.5" (1.689 m)   Wt 249 lb 4 oz (113.1 kg)   BMI 39.63 kg/m  , BMI Body mass index is 39.63 kg/m. Constitutional:  oriented to person, place, and time. No distress. obese HENT:  Head: Normocephalic and atraumatic.  Eyes:  no discharge. No scleral icterus.  Neck: Normal range of motion. Neck supple. No JVD present.  Cardiovascular: irregularly irregularnormal heart sounds and intact distal pulses. Exam reveals no gallop and no friction rub. No edema No murmur heard. Pulmonary/Chest: Effort normal and breath sounds normal. No stridor. No respiratory distress.  no wheezes.  no rales.  no tenderness.  Abdominal: Soft.  no distension.  no tenderness.  Musculoskeletal: Normal range of motion.  no  tenderness or deformity.  Neurological:  normal muscle tone. Coordination normal. No atrophy Skin: Skin is warm and dry. No rash noted. not diaphoretic.  Psychiatric:  normal mood and affect. behavior is normal. Thought content normal.    Recent Labs: 07/31/2017: ALT 15; Hemoglobin 14.5; Platelets 163.0; TSH 1.43 10/10/2017: BUN 17; Creatinine, Ser 1.01; Potassium 4.1; Sodium 138    Lipid Panel Lab Results  Component Value Date   CHOL 152 07/31/2017   HDL 56.60 07/31/2017   LDLCALC 82 07/31/2017   TRIG 64.0 07/31/2017      Wt Readings from Last 3 Encounters:  11/07/17 249 lb 4 oz (113.1 kg)  11/01/17 249 lb 8 oz (113.2 kg)  10/10/17 251 lb 12 oz (114.2 kg)      ASSESSMENT AND PLAN:  Persistent atrial  fibrillation Recommended he hold lisinopril given hypotension We'll add metoprolol succinate 25 mg daily for rate control Continue on eliquis 5 twice a day In 3-4 weeks will meet again to discuss options such as cardioversion or starting antiarrhythmic Long discussion concerning atrial fibrillation Recommended he calls for any worsening shortness of breath, potentially might need Lasix  Hyperlipidemia LDL goal <70 - Plan: EKG 12-Lead Cholesterol is at goal on the current lipid regimen. No changes to the medications were made. stable  Essential hypertension - Plan: EKG 12-Lead Blood pressure low, recommended he stop lisinopril start metoprolol succinate 25 daily  Coronary artery disease involving native coronary artery of native heart without angina pectoris - Plan: EKG 12-Lead Currently  with no symptoms of angina. No further workup at this time. Continue current medication regimen.stable  Stenosis of right carotid artery - Plan: EKG 12-Lead 40 to 50% stenosis on the right October 2014 1-39% stenosis bilaterally August 2019  Severe obesity (BMI >= 40) (HCC) - Plan: EKG 12-Lead We have encouraged continued exercise, careful diet management in an effort to lose weight. Long discussion with patient and his wife Recommended dietary changes and walking program  Total encounter time more than 45 minutes  Greater than 50% was spent in counseling and coordination of care with the patient   Disposition:   F/U  1 month   Orders Placed This Encounter  Procedures  . EKG 12-Lead     Signed, Esmond Plants, M.D., Ph.D. 11/07/2017  [place], [place]

## 2017-11-07 NOTE — Patient Instructions (Addendum)
Medication Instructions:   Please stop the lisinopril  Wait a few days, then start metoprolol one a day  Stay on eliquis twice a day  Labwork:  No new labs needed  Testing/Procedures:  No further testing at this time   Follow-Up: It was a pleasure seeing you in the office today. Please call us if you have new issues that need to be addressed before your next appt.  [phone]  Your physician wants you to follow-up in: 1 month   If you need a refill on your cardiac medications before your next appointment, please call your pharmacy.  For educational health videos Log in to : [email or web address] Or : [email or web address], password : [email or web address]

## 2017-11-08 ENCOUNTER — Telehealth: Payer: Self-pay | Admitting: Cardiovascular Disease

## 2017-11-08 MED ORDER — APIXABAN 5 MG PO TABS: 5.0000 mg | ORAL_TABLET | Freq: Two times a day (BID) | ORAL | 3 refills | Status: DC

## 2017-11-08 NOTE — Telephone Encounter (Signed)
°*  STAT* If patient is at the pharmacy, call can be transferred to refill team.   1. Which medications need to be refilled? (please list name of each medication and dose if known) Eliquis 5 MG  2. Which pharmacy/location (including street and city if local pharmacy) is medication to be sent to? [facility] in [place]  3. Do they need a 30 day or 90 day supply? 30 day   Patient was in office yesterday - states pharmacy did not recieve

## 2017-11-08 NOTE — Telephone Encounter (Signed)
Refill Request.  

## 2017-11-08 NOTE — Telephone Encounter (Signed)
Resent in refill after changing it from  "sample" to "normal" type and resent it in. Called patient and he said he would let us know if any questions or concerns arise.

## 2017-11-08 NOTE — Telephone Encounter (Signed)
Pt verbalized there are no refills at pharmacy he called before calling us and there is nothing there.  Pt verbalized for nurse to call him once refill has been sent.  Please f/u

## 2017-11-13 DIAGNOSIS — H43392 Other vitreous opacities, left eye: Secondary | ICD-10-CM | POA: Diagnosis not present

## 2017-11-13 DIAGNOSIS — Z961 Presence of intraocular lens: Secondary | ICD-10-CM | POA: Diagnosis not present

## 2017-11-13 DIAGNOSIS — H43811 Vitreous degeneration, right eye: Secondary | ICD-10-CM | POA: Diagnosis not present

## 2017-11-13 DIAGNOSIS — H353221 Exudative age-related macular degeneration, left eye, with active choroidal neovascularization: Secondary | ICD-10-CM | POA: Diagnosis not present

## 2017-12-09 NOTE — Progress Notes (Signed)
Cardiology Office Note  Date:  12/11/2017   ID:  Hunter Young, DOB 1941-02-03, MRN [id number]  PCP:  Hunter Loffler, MD   Chief Complaint  Patient presents with  . other    1 month follow up. Meds reviewed by the pt. verbally. "doing well."     HPI:  Hunter Young is a very pleasant 77 year old gentleman with a history of  Stroke Atrial fibrillation CAD stenting to his left circumflex, last catheterization in 2007 showing 40% proximal LAD disease, 70-80% mid RCA disease, nondominant vessel.  40 to 59% right carotid ultrasound in 2014 He presents for routine followup of his coronary artery disease, atrial fibrillation  In general does not know he is in atrial fibrillation Balance problems Also reports having left shoulder pain, allergy unclear  No regular exercise program Legs feel weak, knees are bothering him  Denies any shortness of breath on exertion No leg edema or fluid retention Apart from balance reports that he feels well  Echocardiogram reviewed mildly depressed ejection fraction moderately dilated left atrium Wife previously reported lots of snoring, possible sleep apnea  EKG personally reviewed by myself on todays visit Shows atrial fibrillation ventricular rate 70 bpm PVCs nonspecific ST-T wave abnormality  Other past medical history reviewed Seen in the office with Hunter Young 10/10/2017 Had stroke type symptoms  speech, left hand and arm  Was sent for MRI MRI of the brain (10/17/17)  showed an acute infarction in the right occipital lobe Atrial fib noted on echo 11/01/2017 Started on eliquis 11/01/2017  Results reviewed with him in detail Echo 11/01/2017 - Left ventricle: The cavity size was normal. Wall thickness was increased in a pattern of mild LVH. Systolic function was mildly reduced. The estimated ejection fraction was in the range of 45% to 50%. Diffuse hypokinesis. Regional wall motion abnormalities cannot be excluded. - Mitral valve: There was mild  regurgitation. - Left atrium: The atrium was moderately dilated. - Right ventricle: The cavity size was mildly dilated. Systolic function was mildly reduced.  Metoprolol was stopped in the past secondary to bradycardia. Stopped drinking shots and wine several months ago   PMH:   has a past medical history of ALLERGIC RHINITIS (04/22/2007), ASTHMA, INTRINSIC NOS (12/27/2006), BENIGN PROSTATIC HYPERTROPHY, WITH URINARY OBSTRUCTION (06/25/2007), Cardiomegaly (05/30/2007), Carotid stenosis (01/29/2014), CERVICAL DISORDER, NOS (10/14/2009), CORONARY ARTERY DISEASE (12/03/2006), DIVERTICULOSIS OF COLON (05/20/2001), Dyshidrosis (06/25/2007), GERD (12/06/2007), HYPERLIPIDEMIA (12/03/2006), HYPERTENSION (12/03/2006), INTERNAL HEMORRHOIDS (05/20/2001), OBESITY, MODERATE (12/05/2008), OSTEOARTHRITIS (06/25/2007), and VENOUS INSUFFICIENCY (12/04/2006).  PSH:    Past Surgical History:  Procedure Laterality Date  . ANGIOPLASTY     2 stents  . FOOT SURGERY    . JOINT REPLACEMENT     bilat knee  . TONSILLECTOMY      Current Outpatient Medications  Medication Sig Dispense Refill  . apixaban (ELIQUIS) 5 MG TABS tablet Take 1 tablet (5 mg total) by mouth 2 (two) times daily. 180 tablet 3  . atorvastatin (LIPITOR) 40 MG tablet TAKE 1 TABLET(40 MG) BY MOUTH DAILY 93 tablet 3  . metoprolol succinate (TOPROL XL) 25 MG 24 hr tablet Take 1 tablet (25 mg total) by mouth daily. 90 tablet 3  . Multiple Vitamins-Minerals (PRESERVISION AREDS) TABS Take by mouth. Take 1 tablet by mouth twice daily    . nitroGLYCERIN (NITROSTAT) 0.4 MG SL tablet Place 1 tablet (0.4 mg total) under the tongue every 5 (five) minutes as needed. 25 tablet 6  . oxybutynin (DITROPAN) 5 MG tablet Take 1 tablet (5 mg)  by mouth three times a day     No current facility-administered medications for this visit.      Allergies:   Oxycodone-aspirin   Social History:  The patient  reports that he quit smoking about 37 years ago. He has never used smokeless  tobacco. He reports that he drinks about 21.0 standard drinks of alcohol per week. He reports that he does not use drugs.   Family History:   family history includes Colon cancer (age of onset: 40) in his mother.    Review of Systems: Review of Systems  Constitutional: Negative.   Respiratory: Negative.   Cardiovascular: Negative.   Gastrointestinal: Negative.   Musculoskeletal: Negative.   Neurological: Negative.   Psychiatric/Behavioral: Negative.   All other systems reviewed and are negative.    PHYSICAL EXAM: VS:  BP 120/88 (BP Location: Left Arm, Patient Position: Sitting, Cuff Size: Normal)   Pulse 85   Ht 5' 6.5" (1.689 m)   Wt 250 lb 8 oz (113.6 kg)   BMI 39.83 kg/m  , BMI Body mass index is 39.83 kg/m. Constitutional:  oriented to person, place, and time. No distress. obese HENT:  Head: Normocephalic and atraumatic.  Eyes:  no discharge. No scleral icterus.  Neck: Normal range of motion. Neck supple. No JVD present.  Cardiovascular: irregularly irregularnormal heart sounds and intact distal pulses. Exam reveals no gallop and no friction rub. No edema No murmur heard. Pulmonary/Chest: Effort normal and breath sounds normal. No stridor. No respiratory distress.  no wheezes.  no rales.  no tenderness.  Abdominal: Soft.  no distension.  no tenderness.  Musculoskeletal: Normal range of motion.  no  tenderness or deformity.  Neurological:  normal muscle tone. Coordination normal. No atrophy Skin: Skin is warm and dry. No rash noted. not diaphoretic.  Psychiatric:  normal mood and affect. behavior is normal. Thought content normal.    Recent Labs: 07/31/2017: ALT 15; Hemoglobin 14.5; Platelets 163.0; TSH 1.43 10/10/2017: BUN 17; Creatinine, Ser 1.01; Potassium 4.1; Sodium 138    Lipid Panel Lab Results  Component Value Date   CHOL 152 07/31/2017   HDL 56.60 07/31/2017   LDLCALC 82 07/31/2017   TRIG 64.0 07/31/2017      Wt Readings from Last 3 Encounters:   12/11/17 250 lb 8 oz (113.6 kg)  11/07/17 249 lb 4 oz (113.1 kg)  11/01/17 249 lb 8 oz (113.2 kg)      ASSESSMENT AND PLAN:  Persistent atrial fibrillation Long discussion with him turning treatment options for his atrial fibrillation including cardioversion, medical management, or starting medication such as amiodarone for attempted cardioversion with possible cardioversion down the road He is inclined not to do anything at this time as he feels well with no complaints We will continue current medications including anticoagulation and metoprolol He does not have a way at home to monitor heart rate or blood pressure  Hyperlipidemia LDL goal <70 - Plan: EKG 12-Lead Cholesterol is at goal on the current lipid regimen. No changes to the medications were made.  Stable  Essential hypertension - Plan: EKG 12-Lead  metoprolol succinate 25 daily Blood pressure stable  Coronary artery disease involving native coronary artery of native heart without angina pectoris - Plan: EKG 12-Lead Currently  with no symptoms of angina. No further workup at this time. Continue current medication regimen.stable  Stenosis of right carotid artery - Plan: EKG 12-Lead 40 to 50% stenosis on the right October 2014 1-39% stenosis bilaterally August 2019 Continue Lipitor  Severe obesity (BMI >= 40) (HCC) - Plan: EKG 12-Lead We have encouraged continued exercise, careful diet management in an effort to lose weight. Long discussion with patient and his wife Recommended dietary changes and walking program   Total encounter time more than 25 minutes  Greater than 50% was spent in counseling and coordination of care with the patient   Disposition:   F/U  6 month   No orders of the defined types were placed in this encounter.    Signed, Esmond Plants, M.D., Ph.D. 12/11/2017  [place], [place]

## 2017-12-11 ENCOUNTER — Ambulatory Visit: Payer: Medicare Other | Admitting: Cardiovascular Disease

## 2017-12-11 ENCOUNTER — Encounter: Payer: Self-pay | Admitting: Cardiovascular Disease

## 2017-12-11 VITALS — BP 120/88 | HR 85 | Ht 66.5 in | Wt 250.5 lb

## 2017-12-11 DIAGNOSIS — E785 Hyperlipidemia, unspecified: Secondary | ICD-10-CM

## 2017-12-11 DIAGNOSIS — I6521 Occlusion and stenosis of right carotid artery: Secondary | ICD-10-CM

## 2017-12-11 DIAGNOSIS — I739 Peripheral vascular disease, unspecified: Secondary | ICD-10-CM

## 2017-12-11 DIAGNOSIS — I6310 Cerebral infarction due to embolism of unspecified precerebral artery: Secondary | ICD-10-CM | POA: Diagnosis not present

## 2017-12-11 DIAGNOSIS — I25118 Atherosclerotic heart disease of native coronary artery with other forms of angina pectoris: Secondary | ICD-10-CM

## 2017-12-11 DIAGNOSIS — I481 Persistent atrial fibrillation: Secondary | ICD-10-CM | POA: Diagnosis not present

## 2017-12-11 DIAGNOSIS — I10 Essential (primary) hypertension: Secondary | ICD-10-CM

## 2017-12-11 DIAGNOSIS — I4819 Other persistent atrial fibrillation: Secondary | ICD-10-CM

## 2017-12-11 MED ORDER — APIXABAN 5 MG PO TABS: 5.0000 mg | ORAL_TABLET | Freq: Two times a day (BID) | ORAL | 3 refills | Status: DC

## 2017-12-11 NOTE — Patient Instructions (Signed)

## 2018-01-10 ENCOUNTER — Other Ambulatory Visit: Payer: Self-pay

## 2018-01-10 NOTE — Patient Outreach (Signed)
[facility]) Care Management  01/10/2018  Hunter Young Apr 18, 1940 [id number]   Medication Adherence call to [name] left a message for patient to call back patient is due on Lisinopril 40 mg. Hunter Young is showing past due under [place].   [facility] Direct Dial [phone]  Fax [phone] [email or web address]

## 2018-01-15 DIAGNOSIS — H353221 Exudative age-related macular degeneration, left eye, with active choroidal neovascularization: Secondary | ICD-10-CM | POA: Diagnosis not present

## 2018-01-15 DIAGNOSIS — H43811 Vitreous degeneration, right eye: Secondary | ICD-10-CM | POA: Diagnosis not present

## 2018-01-15 DIAGNOSIS — Z961 Presence of intraocular lens: Secondary | ICD-10-CM | POA: Diagnosis not present

## 2018-01-15 DIAGNOSIS — H353111 Nonexudative age-related macular degeneration, right eye, early dry stage: Secondary | ICD-10-CM | POA: Diagnosis not present

## 2018-01-15 DIAGNOSIS — H43392 Other vitreous opacities, left eye: Secondary | ICD-10-CM | POA: Diagnosis not present

## 2018-01-17 ENCOUNTER — Other Ambulatory Visit: Payer: Self-pay | Admitting: Family Medicine

## 2018-01-17 NOTE — Telephone Encounter (Signed)
Per refilled Oxybutynin was changed to Flomax 08/08/2017.  I spoke with Hunter Young to verify and he states he don't remember being changed to Flomax but would like to try it because he doesn't feel the Oxybutynin is really working.  After further review on chart on visit from 10/10/2017 it looks like Flomax was stopped and Oxybutynin was restarted.  Should we refill Oxybutynin or try a different medication since he doesn't feel like it is working.  Please advise.

## 2018-01-18 MED ORDER — SOLIFENACIN SUCCINATE 5 MG PO TABS: 5.0000 mg | ORAL_TABLET | Freq: Every day | ORAL | 3 refills | Status: DC

## 2018-01-18 NOTE — Telephone Encounter (Signed)
Hunter Young called back.  He states his insurance will cover the Vesicare and Myrbetriq.  He would like to try the Vesicare.  He looked up the Myrbetriq and one of the side effects was that it could raise your blood pressure so he doesn't want to take that one.  Please send Rx to [facility] in [place].

## 2018-01-18 NOTE — Telephone Encounter (Signed)
Flomax is not equivalent.  As far as replacement med for oxybutynin.Marland Kitchen He should check with incurance because other meds are liekly going to be more expensive.. Have him check his formulary for myrbetriq or toviaz, vesicare etc. If he does want to try different medication.

## 2018-01-18 NOTE — Telephone Encounter (Signed)
Hunter Young notified as instructed by telephone.  He will call his insurance to see if they will cover any of the other medications listed.  He did go to pharmacy last night and they gave him a bottle of Flomax.  I advised that he should not take the Flomax since Dr. Lorelei Pont stopped it in July.  He will call me back once he speaks with his insurance.

## 2018-02-28 DIAGNOSIS — M779 Enthesopathy, unspecified: Secondary | ICD-10-CM | POA: Diagnosis not present

## 2018-02-28 DIAGNOSIS — M25512 Pain in left shoulder: Secondary | ICD-10-CM | POA: Diagnosis not present

## 2018-02-28 DIAGNOSIS — M19011 Primary osteoarthritis, right shoulder: Secondary | ICD-10-CM | POA: Diagnosis not present

## 2018-02-28 DIAGNOSIS — M25511 Pain in right shoulder: Secondary | ICD-10-CM | POA: Diagnosis not present

## 2018-03-08 ENCOUNTER — Ambulatory Visit (INDEPENDENT_AMBULATORY_CARE_PROVIDER_SITE_OTHER): Payer: Medicare Other | Admitting: Family Medicine

## 2018-03-08 ENCOUNTER — Encounter: Payer: Self-pay | Admitting: Family Medicine

## 2018-03-08 VITALS — BP 120/76 | HR 76 | Temp 97.7°F | Ht 66.5 in | Wt 246.5 lb

## 2018-03-08 DIAGNOSIS — N3281 Overactive bladder: Secondary | ICD-10-CM | POA: Diagnosis not present

## 2018-03-08 DIAGNOSIS — I639 Cerebral infarction, unspecified: Secondary | ICD-10-CM

## 2018-03-08 DIAGNOSIS — R42 Dizziness and giddiness: Secondary | ICD-10-CM | POA: Diagnosis not present

## 2018-03-08 NOTE — Progress Notes (Signed)
Subjective:    Patient ID: Hunter Young, male    DOB: 11-Mar-1941, 77 y.o.   MRN: [id number]  HPI    77 year old male pt of Dr. Lillie Fragmin with history of  Right occipital lobeCVA in 7/20919 cardiomegaly, stenosis of right carotid CAD, HTN, presents with  conitnued balance issues and headache.  He has noted that after sitting a while.. when standing... has few moments of more significant dizziness, but then is lightheaded during the rest of the day.  Feels balance off because " swimmyheaded'  Mild headachy during the day.  he is functioning at work just fine... symptoms are mild but annoying.  No CP, no SOB, minimal fatigue.  No vertigo. No syncope. No weakness, no numbness. No new facial droop, slurred speech. He feels that vesicare makes him feel dizzy.Marland Kitchen stopped it for a few day and dizziness was better.  Had similar issues prior to CVA earlier in year.. but now returned in last 3 weeks.  Overactive bladder He also notes that vesicare is not helping with his urinary symptoms.    Vesicare was started. In 12/2017? Takes in AM.  Oxybutynin.. helped more with urinary issues, not sure whay changed.    Social History /Family History/Past Medical History reviewed in detail and updated in EMR if needed. Blood pressure 120/76, pulse 76, temperature 97.7 F (36.5 C), temperature source Oral, height 5' 6.5" (1.689 m), weight 246 lb 8 oz (111.8 kg).    Review of Systems  Constitutional: Negative for fatigue and fever.  HENT: Negative for ear pain.   Eyes: Negative for pain.  Respiratory: Negative for cough and shortness of breath.   Cardiovascular: Negative for chest pain, palpitations and leg swelling.  Gastrointestinal: Negative for abdominal pain.  Genitourinary: Negative for dysuria.  Musculoskeletal: Negative for arthralgias.  Neurological: Positive for light-headedness. Negative for syncope and headaches.  Psychiatric/Behavioral: Negative for dysphoric mood.       Objective:     Physical Exam Constitutional:      Appearance: He is well-developed.     Comments: overweight  HENT:     Head: Normocephalic.     Right Ear: Hearing normal.     Left Ear: Hearing normal.     Nose: Nose normal.  Neck:     Thyroid: No thyroid mass or thyromegaly.     Vascular: No carotid bruit.     Trachea: Trachea normal.  Cardiovascular:     Rate and Rhythm: Normal rate and regular rhythm.     Pulses: Normal pulses.     Heart sounds: Heart sounds not distant. No murmur. No friction rub. No gallop.      Comments: No peripheral edema Pulmonary:     Effort: Pulmonary effort is normal. No respiratory distress.     Breath sounds: Normal breath sounds.  Skin:    General: Skin is warm and dry.     Findings: No rash.  Neurological:     Mental Status: He is oriented to person, place, and time.     Cranial Nerves: Facial asymmetry present.     Motor: Motor function is intact. No atrophy.     Coordination: Coordination is intact. Romberg sign negative. Coordination normal.     Gait: Gait normal.     Deep Tendon Reflexes: Reflexes are normal and symmetric.     Comments:  Has longtime facial asymmetry after past  accident and facial surgical repair  Psychiatric:        Speech: Speech normal.  Behavior: Behavior normal.        Thought Content: Thought content normal.           Assessment & Plan:

## 2018-03-08 NOTE — Assessment & Plan Note (Addendum)
Symptoms are mild... triggered by being up on feet.  Pt states has been better when he stopped vesicare for a few days.  Orthostatics nml except increase in heart rate with standing  No clear symptoms of new CVA but high risk given past CVA.  Pt on anticoag.  Will have  Pt stop vesicare.. follow up with PCP in 2 weeks for re-eval. Sooner if new issues

## 2018-03-08 NOTE — Patient Instructions (Addendum)
Stop vesicare. Stay of overactive bladder meds for now.   Call if new neurologic symptoms.

## 2018-04-02 DIAGNOSIS — I639 Cerebral infarction, unspecified: Secondary | ICD-10-CM | POA: Insufficient documentation

## 2018-04-02 NOTE — Progress Notes (Signed)
Dr. Frederico Hamman T. Kazmir Oki, MD, [facility] [place], [address] Phone: [phone] Fax: [phone]  04/03/2018  Patient: Hunter Young, MRN: [id number], DOB: Mar 17, 1941, 78 y.o.  Primary Physician:  Owens Loffler, MD   Chief Complaint  Patient presents with  . Follow-up    Gait Issue   Subjective:   Hunter Young is a 78 y.o. very pleasant male patient who presents with the following:  Hunter Young is a well-known patient with a history of an occipital stroke in July 2019, and he also has a history of coronary disease.  Over the last month he is developed some dizziness, and he saw my partner Dr. Diona Browner about 2 weeks ago, and at that point she stopped his Vesicare.  He is here for a recheck.  Better in terms of dizziness and headaches.  Now when going to walk, balance is off and not doing well.  Feels like going to stumble and fall - not walking well.   We diagnosed his occipital stroke last summer, primarily due to his gait disturbance.  Since that time he is continued to have some problems with his gait, and he does not really think that this is changed all that much compared to then.  He is not having any other symptoms except for some occasional dizziness.  No orthostasis on last office visit.  Labs have been functionally unremarkable recently.  He also recently stopped his oxybutynin, but he continued his Vesicare.  Past Medical History, Surgical History, Social History, Family History, Problem List, Medications, and Allergies have been reviewed and updated if relevant.  Patient Active Problem List   Diagnosis Date Noted  . Occipital stroke ([place]) 04/02/2018    Priority: High  . Cardiomegaly 05/30/2007    Priority: High  . Coronary artery disease involving native coronary artery without angina pectoris 12/03/2006    Priority: High  . Stenosis of right carotid artery 01/29/2014    Priority: Medium  . Severe obesity  (BMI >= 40) ([place]) 04/02/2013    Priority: Medium  . Hyperlipidemia LDL goal <70 12/03/2006    Priority: Medium  . Essential hypertension 12/03/2006    Priority: Medium  . OAB (overactive bladder) 03/08/2018  . CERVICAL DISORDER, NOS 10/14/2009  . GERD 12/06/2007  . BPH (benign prostatic hypertrophy) with urinary obstruction 06/25/2007  . OSTEOARTHRITIS 06/25/2007  . Allergic rhinitis 04/22/2007  . VENOUS INSUFFICIENCY 12/04/2006  . DIVERTICULOSIS OF COLON 05/20/2001    Past Medical History:  Diagnosis Date  . ALLERGIC RHINITIS 04/22/2007  . ASTHMA, INTRINSIC NOS 12/27/2006  . BENIGN PROSTATIC HYPERTROPHY, WITH URINARY OBSTRUCTION 06/25/2007  . Cardiomegaly 05/30/2007  . Carotid stenosis 01/29/2014  . CERVICAL DISORDER, NOS 10/14/2009  . CORONARY ARTERY DISEASE 12/03/2006  . DIVERTICULOSIS OF COLON 05/20/2001  . Dyshidrosis 06/25/2007  . GERD 12/06/2007  . HYPERLIPIDEMIA 12/03/2006  . HYPERTENSION 12/03/2006  . INTERNAL HEMORRHOIDS 05/20/2001  . OBESITY, MODERATE 12/05/2008  . OSTEOARTHRITIS 06/25/2007  . VENOUS INSUFFICIENCY 12/04/2006    Past Surgical History:  Procedure Laterality Date  . ANGIOPLASTY     2 stents  . FOOT SURGERY    . JOINT REPLACEMENT     bilat knee  . TONSILLECTOMY      Social History   Socioeconomic History  . Marital status: Married    Spouse name: Not on file  . Number of children: Not on file  . Years of education: Not on file  . Highest education  level: Not on file  Occupational History  . Not on file  Social Needs  . Financial resource strain: Not on file  . Food insecurity:    Worry: Not on file    Inability: Not on file  . Transportation needs:    Medical: Not on file    Non-medical: Not on file  Tobacco Use  . Smoking status: Former Smoker    Last attempt to quit: 08/21/1980    Years since quitting: 37.6  . Smokeless tobacco: Never Used  Substance and Sexual Activity  . Alcohol use: Yes    Alcohol/week: 21.0 standard drinks    Types: 21  Standard drinks or equivalent per week  . Drug use: No  . Sexual activity: Not Currently  Lifestyle  . Physical activity:    Days per week: Not on file    Minutes per session: Not on file  . Stress: Not on file  Relationships  . Social connections:    Talks on phone: Not on file    Gets together: Not on file    Attends religious service: Not on file    Active member of club or organization: Not on file    Attends meetings of clubs or organizations: Not on file    Relationship status: Not on file  . Intimate partner violence:    Fear of current or ex partner: Not on file    Emotionally abused: Not on file    Physically abused: Not on file    Forced sexual activity: Not on file  Other Topics Concern  . 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       Works part time for [employer].    Runs hub to [place], [place]. 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    In trucking business for a long time.    Family History  Problem Relation Age of Onset  . Colon cancer Mother 49    Allergies  Allergen Reactions  . Oxycodone-Aspirin Nausea And Vomiting    Medication list reviewed and updated in full in [facility].   GEN: No acute illnesses, no fevers, chills. GI: No n/v/d, eating normally Pulm: No SOB Interactive and getting along well at home.  Otherwise, ROS is as per the HPI.  Objective:   BP 108/80   Pulse 70   Temp (!) 97.5 F (36.4 C) (Oral)   Ht 5\' 6"  (1.676 m)   Wt 247 lb 12 oz (112.4 kg)   SpO2 98%   BMI 39.99 kg/m   GEN: WDWN, NAD, Non-toxic, A & O x 3 HEENT: Atraumatic, Normocephalic. Neck supple. No masses, No LAD. Ears and Nose: No external deformity. CV: RRR, No M/G/R. No JVD. No thrill. No extra heart sounds. PULM: CTA B, no wheezes, crackles, rhonchi. No retractions. No resp. distress. No accessory muscle use. EXTR: No c/c/e PSYCH: Normally interactive. Conversant. Not depressed or anxious appearing.  Calm  demeanor.   Neuro: CN 2-12 grossly intact, except baseline R facial drooping post 78 yo trauma. PERRLA. EOMI. Sensation intact throughout. Str 5/5 all extremities. DTR 2+. No clonus. A and o x 4. Romberg neg. Finger nose neg. Heel -shin pos and unable to walk heel to toe without significant gait unsteadiness.     Laboratory and Imaging Data: Mr Jeri Cos IO Contrast  Result Date: 10/17/2017 CLINICAL DATA:  Ataxia gait disturbance, new onset headache. EXAM: MRI HEAD WITHOUT AND WITH  CONTRAST TECHNIQUE: Multiplanar, multiecho pulse sequences of the brain and surrounding structures were obtained without and with intravenous contrast. CONTRAST:  42mL MULTIHANCE GADOBENATE DIMEGLUMINE 529 MG/ML IV SOLN COMPARISON:  None. FINDINGS: Brain: Three small (2-3 mm) foci of restricted diffusion RIGHT occipital lobe cortex, corresponding low ADC, consistent with acute infarction. No hemorrhage, mass lesion, or extra-axial fluid. Tiny foci of chronic cerebellar infarction are noted. Generalized atrophy, hydrocephalus ex vacuo. Moderately advanced T2 and FLAIR hyperintensities in the periventricular and subcortical white matter, consistent with small vessel disease. Prominent perivascular spaces. No chronic hemorrhage. Post infusion, no abnormal enhancement of the brain or meninges. Vascular: Flow voids are maintained. Skull and upper cervical spine: Normal marrow signal. Sinuses/Orbits: No significant sinus disease. BILATERAL cataract extraction. Otherwise negative orbits. Other: No middle ear or mastoid fluid. IMPRESSION: Small foci of acute infarction are noted in the RIGHT occipital lobe see discussion above. No proximal vascular occlusion is evident. Shower of emboli is suspected. Generalized atrophy with moderate small vessel disease. No abnormal postcontrast enhancement. These results will be called to the ordering clinician or representative by the Radiologist Assistant, and communication documented in the PACS or  zVision Dashboard. Electronically Signed   By: Staci Righter M.D.   On: 10/17/2017 14:02    Assessment and Plan:   Occipital stroke ([place]) - Plan: Ambulatory referral to Neurology, Ambulatory referral to Physical Therapy, Basic metabolic panel, CBC with Differential/Platelet, Hepatic function panel, TSH  Dizziness - Plan: Ambulatory referral to Neurology, Ambulatory referral to Physical Therapy, Basic metabolic panel, CBC with Differential/Platelet, Hepatic function panel, TSH  Coronary artery disease involving native coronary artery of native heart without angina pectoris - Plan: Basic metabolic panel, CBC with Differential/Platelet, Hepatic function panel, TSH  Essential hypertension  Hyperlipidemia LDL goal <70  Severe obesity (BMI >= 40) ([place])  Stenosis of right carotid artery  Need for prophylactic vaccination and inoculation against influenza - Plan: Flu Vaccine QUAD 6+ mos PF IM (Fluarix Quad PF)  Ataxia due to recent stroke - Plan: Ambulatory referral to Physical Therapy, Basic metabolic panel, CBC with Differential/Platelet, Hepatic function panel, TSH  I think very likely relates to his prior stroke.  I am good to have him go to neuro rehabilitation at [facility] for gait training and neuro rehab.  She also had a stroke work-up previously and is on appropriate medication.  With his continued symptoms, I am going to consult neurology to get their opinion to see if they can add any other recommendations.  I appreciate their help.  Cut metoprolol dosing in half.  Discontinue Vesicare.  Patient Instructions  Stop the Vesicare.  Cut your metoprolol so that you are only taking 1/2 a tablet a day.  REFERRALS TO SPECIALISTS, SPECIAL TESTS (MRI, CT, ULTRASOUNDS)  [facility] or  [facility] will help you. ASK CHECK-IN FOR HELP.  Specialist appointment times vary a great deal, based on their schedule / openings. -- Some specialists have very long wait times. (Example. Dermatology)        Meds ordered this encounter  Medications  . metoprolol succinate (TOPROL XL) 25 MG 24 hr tablet    Sig: Take 0.5 tablets (12.5 mg total) by mouth daily.    Dispense:  90 tablet    Refill:  3   Orders Placed This Encounter  Procedures  . Flu Vaccine QUAD 6+ mos PF IM (Fluarix Quad PF)  . Basic metabolic panel  . CBC with Differential/Platelet  . Hepatic function panel  . TSH  . Ambulatory referral  to Neurology  . Ambulatory referral to Physical Therapy    Signed,  Frederico Hamman T. Flora Ratz, MD   Outpatient Encounter Medications as of 04/03/2018  Medication Sig  . apixaban (ELIQUIS) 5 MG TABS tablet Take 1 tablet (5 mg total) by mouth 2 (two) times daily.  Marland Kitchen atorvastatin (LIPITOR) 40 MG tablet TAKE 1 TABLET(40 MG) BY MOUTH DAILY  . metoprolol succinate (TOPROL XL) 25 MG 24 hr tablet Take 0.5 tablets (12.5 mg total) by mouth daily.  . Multiple Vitamins-Minerals (PRESERVISION AREDS) TABS Take by mouth. Take 1 tablet by mouth twice daily  . nitroGLYCERIN (NITROSTAT) 0.4 MG SL tablet Place 1 tablet (0.4 mg total) under the tongue every 5 (five) minutes as needed.  . solifenacin (VESICARE) 5 MG tablet Take 1 tablet (5 mg total) by mouth daily.  . [DISCONTINUED] metoprolol succinate (TOPROL XL) 25 MG 24 hr tablet Take 1 tablet (25 mg total) by mouth daily.   No facility-administered encounter medications on file as of 04/03/2018.

## 2018-04-03 ENCOUNTER — Ambulatory Visit (INDEPENDENT_AMBULATORY_CARE_PROVIDER_SITE_OTHER): Payer: Medicare Other | Admitting: Family Medicine

## 2018-04-03 ENCOUNTER — Encounter: Payer: Self-pay | Admitting: Family Medicine

## 2018-04-03 ENCOUNTER — Ambulatory Visit: Payer: Medicare Other | Admitting: Family Medicine

## 2018-04-03 VITALS — BP 108/80 | HR 70 | Temp 97.5°F | Ht 66.0 in | Wt 247.8 lb

## 2018-04-03 DIAGNOSIS — I6521 Occlusion and stenosis of right carotid artery: Secondary | ICD-10-CM

## 2018-04-03 DIAGNOSIS — E785 Hyperlipidemia, unspecified: Secondary | ICD-10-CM

## 2018-04-03 DIAGNOSIS — Z23 Encounter for immunization: Secondary | ICD-10-CM

## 2018-04-03 DIAGNOSIS — I69393 Ataxia following cerebral infarction: Secondary | ICD-10-CM | POA: Diagnosis not present

## 2018-04-03 DIAGNOSIS — I639 Cerebral infarction, unspecified: Secondary | ICD-10-CM

## 2018-04-03 DIAGNOSIS — I10 Essential (primary) hypertension: Secondary | ICD-10-CM

## 2018-04-03 DIAGNOSIS — R42 Dizziness and giddiness: Secondary | ICD-10-CM

## 2018-04-03 DIAGNOSIS — I2510 Atherosclerotic heart disease of native coronary artery without angina pectoris: Secondary | ICD-10-CM | POA: Diagnosis not present

## 2018-04-03 LAB — CBC WITH DIFFERENTIAL/PLATELET
BASOS PCT: 0.5 % (ref 0.0–3.0)
Basophils Absolute: 0 10*3/uL (ref 0.0–0.1)
EOS ABS: 0.1 10*3/uL (ref 0.0–0.7)
EOS PCT: 1.2 % (ref 0.0–5.0)
HEMATOCRIT: 50.5 % (ref 39.0–52.0)
Hemoglobin: 16 g/dL (ref 13.0–17.0)
LYMPHS PCT: 26.1 % (ref 12.0–46.0)
Lymphs Abs: 1.9 10*3/uL (ref 0.7–4.0)
MCHC: 31.7 g/dL (ref 30.0–36.0)
MCV: 89.5 fl (ref 78.0–100.0)
MONO ABS: 0.7 10*3/uL (ref 0.1–1.0)
Monocytes Relative: 9.8 % (ref 3.0–12.0)
Neutro Abs: 4.6 10*3/uL (ref 1.4–7.7)
Neutrophils Relative %: 62.4 % (ref 43.0–77.0)
Platelets: 196 10*3/uL (ref 150.0–400.0)
RBC: 5.64 Mil/uL (ref 4.22–5.81)
RDW: 15.2 % (ref 11.5–15.5)
WBC: 7.4 10*3/uL (ref 4.0–10.5)

## 2018-04-03 LAB — HEPATIC FUNCTION PANEL
ALT: 36 U/L (ref 0–53)
AST: 24 U/L (ref 0–37)
Albumin: 3.7 g/dL (ref 3.5–5.2)
Alkaline Phosphatase: 63 U/L (ref 39–117)
BILIRUBIN DIRECT: 0.2 mg/dL (ref 0.0–0.3)
TOTAL PROTEIN: 6.6 g/dL (ref 6.0–8.3)
Total Bilirubin: 0.5 mg/dL (ref 0.2–1.2)

## 2018-04-03 LAB — TSH: TSH: 1.12 u[IU]/mL (ref 0.35–4.50)

## 2018-04-03 LAB — BASIC METABOLIC PANEL
BUN: 18 mg/dL (ref 6–23)
CHLORIDE: 105 meq/L (ref 96–112)
CO2: 29 meq/L (ref 19–32)
CREATININE: 0.96 mg/dL (ref 0.40–1.50)
Calcium: 9.9 mg/dL (ref 8.4–10.5)
GFR: 80.16 mL/min (ref >60.00)
Glucose, Bld: 87 mg/dL (ref 70–99)
Potassium: 4.5 mEq/L (ref 3.5–5.1)
Sodium: 140 mEq/L (ref 135–145)

## 2018-04-03 MED ORDER — METOPROLOL SUCCINATE ER 25 MG PO TB24: 12.5000 mg | ORAL_TABLET | Freq: Every day | ORAL | 3 refills | Status: DC

## 2018-04-03 NOTE — Patient Instructions (Addendum)
Stop the Vesicare.  Cut your metoprolol so that you are only taking 1/2 a tablet a day.  REFERRALS TO SPECIALISTS, SPECIAL TESTS (MRI, CT, ULTRASOUNDS)  [facility] or  [facility]ya will help you. ASK CHECK-IN FOR HELP.  Specialist appointment times vary a great deal, based on their schedule / openings. -- Some specialists have very long wait times. (Example. Dermatology)

## 2018-04-05 ENCOUNTER — Telehealth: Payer: Self-pay | Admitting: *Deleted

## 2018-04-05 NOTE — Telephone Encounter (Signed)
Lab results discussed with Hunter Young via telephone.  See result note.

## 2018-04-05 NOTE — Telephone Encounter (Signed)
-----   Message from Owens Loffler, MD sent at 04/05/2018  7:35 AM EST ----- Please call (he does not seem to use mychart)  All labs are completely normal Move slowly and safely He should be seeing both Neurology and the neurorehab departments in the near future

## 2018-04-05 NOTE — Telephone Encounter (Signed)
Left message for Hunter Young to return call in regards to his lab results.

## 2018-04-09 DIAGNOSIS — H43811 Vitreous degeneration, right eye: Secondary | ICD-10-CM | POA: Diagnosis not present

## 2018-04-09 DIAGNOSIS — H353111 Nonexudative age-related macular degeneration, right eye, early dry stage: Secondary | ICD-10-CM | POA: Diagnosis not present

## 2018-04-09 DIAGNOSIS — H43392 Other vitreous opacities, left eye: Secondary | ICD-10-CM | POA: Diagnosis not present

## 2018-04-09 DIAGNOSIS — H353221 Exudative age-related macular degeneration, left eye, with active choroidal neovascularization: Secondary | ICD-10-CM | POA: Diagnosis not present

## 2018-04-29 ENCOUNTER — Ambulatory Visit: Payer: Medicare Other | Attending: Family Medicine | Admitting: Physical Therapy

## 2018-04-29 ENCOUNTER — Encounter: Payer: Self-pay | Admitting: Physical Therapy

## 2018-04-29 DIAGNOSIS — R269 Unspecified abnormalities of gait and mobility: Secondary | ICD-10-CM | POA: Insufficient documentation

## 2018-04-29 DIAGNOSIS — M6281 Muscle weakness (generalized): Secondary | ICD-10-CM

## 2018-04-29 DIAGNOSIS — I639 Cerebral infarction, unspecified: Secondary | ICD-10-CM | POA: Insufficient documentation

## 2018-04-29 DIAGNOSIS — R2689 Other abnormalities of gait and mobility: Secondary | ICD-10-CM | POA: Diagnosis not present

## 2018-04-29 NOTE — Therapy (Deleted)
[facility] [address] [place], [place], [address] Phone: [phone]   Fax:  [phone]  Physical Therapy Evaluation  Patient Details  Name: Hunter Young MRN: [id number] Date of Birth: 06-12-1940 No data recorded  Encounter Date: 04/29/2018    Past Medical History:  Diagnosis Date  . ALLERGIC RHINITIS 04/22/2007  . ASTHMA, INTRINSIC NOS 12/27/2006  . BENIGN PROSTATIC HYPERTROPHY, WITH URINARY OBSTRUCTION 06/25/2007  . Cardiomegaly 05/30/2007  . Carotid stenosis 01/29/2014  . CERVICAL DISORDER, NOS 10/14/2009  . CORONARY ARTERY DISEASE 12/03/2006  . DIVERTICULOSIS OF COLON 05/20/2001  . Dyshidrosis 06/25/2007  . GERD 12/06/2007  . HYPERLIPIDEMIA 12/03/2006  . HYPERTENSION 12/03/2006  . INTERNAL HEMORRHOIDS 05/20/2001  . OBESITY, MODERATE 12/05/2008  . OSTEOARTHRITIS 06/25/2007  . VENOUS INSUFFICIENCY 12/04/2006    Past Surgical History:  Procedure Laterality Date  . ANGIOPLASTY     2 stents  . FOOT SURGERY    . JOINT REPLACEMENT     bilat knee  . TONSILLECTOMY      There were no vitals filed for this visit.   Subjective Assessment - 04/29/18 1100    Subjective  Pt. states his legs bother him.  Pt. reports he has concerns with his balance.  Pt. discussed that he drinks 3-4 bourbon per day.  Pt. reports feeling dizzy and off balance in morning.      Pertinent History  TKA (R 2003 and L 2005)     How long can you sit comfortably?  long time (except he has to go to bathroom often)    How long can you stand comfortably?  <15 minutes (balance concerns).    How long can you walk comfortably?  walking less recently      Patient Stated Goals  Improve LE muscle strength/ independence with walking.      Currently in Pain?  Yes    Pain Location  Back    Pain Orientation  Lower         [facility] PT Assessment - 04/29/18 0001      Balance Screen   Has the patient fallen in the past 6 months  No    Has the patient had a decrease in activity  level because of a fear of falling?   Yes        Standing lumbar flexion/ extension/ lateral flexion L and R/ rotation L and R (limited 50%)- pt. Reports feeling unsteady, not dizzy with lumbar assessment.              Objective measurements completed on examination: See above findings.                             Patient will benefit from skilled therapeutic intervention in order to improve the following deficits and impairments:     Visit Diagnosis: No diagnosis found.     Problem List Patient Active Problem List   Diagnosis Date Noted  . Occipital stroke ([place]) 04/02/2018  . OAB (overactive bladder) 03/08/2018  . Stenosis of right carotid artery 01/29/2014  . Severe obesity (BMI >= 40) ([place]) 04/02/2013  . CERVICAL DISORDER, NOS 10/14/2009  . GERD 12/06/2007  . BPH (benign prostatic hypertrophy) with urinary obstruction 06/25/2007  . OSTEOARTHRITIS 06/25/2007  . Cardiomegaly 05/30/2007  . Allergic rhinitis 04/22/2007  . VENOUS INSUFFICIENCY 12/04/2006  . Hyperlipidemia LDL goal <70 12/03/2006  . Essential hypertension 12/03/2006  . Coronary artery disease involving native  coronary artery without angina pectoris 12/03/2006  . DIVERTICULOSIS OF COLON 05/20/2001    [name] 04/29/2018, 11:18 AM  [facility] [address] [place], [place], [address] Phone: [phone]   Fax:  [phone]  Name: Hunter Young MRN: [id number] Date of Birth: [birth year]

## 2018-04-29 NOTE — Patient Instructions (Signed)
Access Code: [email or web address]  URL: [email or web address]  Date: 04/29/2018  Prepared by: Dorcas Carrow   Exercises  Side to Side Weight Shift with Counter Support - 15 reps - 2 sets - 1x daily - 7x weekly  Standing March with Unilateral Counter Support - 15 reps - 2 sets - 1x daily - 7x weekly  Standing Tandem Balance with Counter Support - 10 reps - 2 sets - 30 hold - 1x daily - 7x weekly

## 2018-04-29 NOTE — Therapy (Signed)
[facility] [address] [place], [place], [address] Phone: [phone]   Fax:  [phone]  Physical Therapy Evaluation  Patient Details  Name: Hunter Young MRN: [id number] Date of Birth: February 22, 1941 Referring Provider (PT): Dr. Edilia Bo   Encounter Date: 04/29/2018   Treatment: 1 of 9.  Recert date: 05/02/2033 5974 to 68    Past Medical History:  Diagnosis Date  . ALLERGIC RHINITIS 04/22/2007  . ASTHMA, INTRINSIC NOS 12/27/2006  . BENIGN PROSTATIC HYPERTROPHY, WITH URINARY OBSTRUCTION 06/25/2007  . Cardiomegaly 05/30/2007  . Carotid stenosis 01/29/2014  . CERVICAL DISORDER, NOS 10/14/2009  . CORONARY ARTERY DISEASE 12/03/2006  . DIVERTICULOSIS OF COLON 05/20/2001  . Dyshidrosis 06/25/2007  . GERD 12/06/2007  . HYPERLIPIDEMIA 12/03/2006  . HYPERTENSION 12/03/2006  . INTERNAL HEMORRHOIDS 05/20/2001  . OBESITY, MODERATE 12/05/2008  . OSTEOARTHRITIS 06/25/2007  . VENOUS INSUFFICIENCY 12/04/2006    Past Surgical History:  Procedure Laterality Date  . ANGIOPLASTY     2 stents  . FOOT SURGERY    . JOINT REPLACEMENT     bilat knee  . TONSILLECTOMY      There were no vitals filed for this visit.   Pt. states his legs feel weak and has concerns with his balance. Pt. discussed that he drinks 3-4 bourbon per day but has been trying to decrease his alcohol intake. Pt. reports feeling dizzy and off balance in morning and takes 5-10 minutes to subside sitting up before he beings walking. Pt. is retired and lives sedentary lifestyle but states that his wife would like him to be more active.      Gait: Pt. Ambulates with L antalgic gait with R lateral lean and B knee valgus. Pt. Reports some dizziness and feels like he is about to fall when ambulating > 20 feet in clinic.    AROM: Standing lumbar flexion/ extension/ lateral flexion L and R/ rotation L and R (limited 50%)- pt. Reports feeling unsteady, not dizzy with lumbar assessment.     MMT: Hip flexion: R 4/5, L 4+/5 Hip abduction: R 5/5, L 5/5 Hip adduction: R 5/5, L 5/5 Knee ext: R 5/5, L 5/5 Knee flexion: R 5/5, L 5/5 DF: R 4+/5, L 4/5  Flexibility: Decr. Flexibility noted in B HS  Balance Assessment: Berg: 44/56 Tandem balance: unable to hold >10 sec.   Objective measurements completed on examination: See above findings.         PT Long Term Goals - 04/29/18 1248      PT LONG TERM GOAL #1   Title  Pt. will increase FOTO score to >56 in order to improve functional mobility without loss of balance during ADLs.    Baseline  Initial FOTO: 48    Time  4    Period  Weeks    Status  New    Target Date  05/27/18      PT LONG TERM GOAL #2   Title  Pt. will be able to ambulate for >6 minutes without reporting LE weakness to improve functional mobility and community walking.    Baseline  Pt. reports weakness after ~ 1 minute of ambulating in clinic on eval.    Time  4    Period  Weeks    Status  New    Target Date  05/27/18      PT LONG TERM GOAL #3   Title  Pt. will be able to score > 50 on the Berg assessment in order  to promote safe community ambulation without AD.    Baseline  Berg on eval: 44    Time  4    Period  Weeks    Status  New    Target Date  05/27/18      PT LONG TERM GOAL #4   Title  Pt. will be independent with HEP to promote LE strength/ endurance and safe balance with functional activities.    Baseline  Pt. was prescribed HEP on eval which will be progressed into independent HEP.    Time  4    Period  Weeks    Status  New    Target Date  05/27/18       Pt. is a pleasant 78 y/o male with a hx of occipital stroke (10/10/2017). Since then, pt. has noticed decreased balance and strength in B LE and "fear of falling" when ambulating. Pt. demonstrated limited spine AROM by 50% and some dizziness reported. Pt. has grossly 5/5 MMT B LE strength except for hip flexion (R 4/5, L 4+/5) and DF (R 4+/5, L 4/5). Pt. performed 44/56 on Berg  balance assessment so PT educated/ instructed pt. on proper use of SPC. Pt. was able to ambulate 5 laps in clinic with SPC (L hand to decr. R lateral lean when ambulating) and reported that "he felt like his balance was better." PT gave standing counter balance HEP as well as beginning to ambulate in community with SPC. Pt. will benefit from skilled PT services in order to improve overall balance and gait to promote safe and functional ADLs/ ambulation.      Patient will benefit from skilled therapeutic intervention in order to improve the following deficits and impairments:  Abnormal gait, Decreased coordination, Decreased mobility, Dizziness, Postural dysfunction, Decreased activity tolerance, Decreased endurance, Difficulty walking, Decreased balance, Decreased strength, Pain, Decreased range of motion  Visit Diagnosis: Occipital stroke ([facility])  Gait difficulty  Balance problems  Muscle weakness (generalized)     Problem List Patient Active Problem List   Diagnosis Date Noted  . Occipital stroke ([place]) 04/02/2018  . OAB (overactive bladder) 03/08/2018  . Stenosis of right carotid artery 01/29/2014  . Severe obesity (BMI >= 40) ([place]) 04/02/2013  . CERVICAL DISORDER, NOS 10/14/2009  . GERD 12/06/2007  . BPH (benign prostatic hypertrophy) with urinary obstruction 06/25/2007  . OSTEOARTHRITIS 06/25/2007  . Cardiomegaly 05/30/2007  . Allergic rhinitis 04/22/2007  . VENOUS INSUFFICIENCY 12/04/2006  . Hyperlipidemia LDL goal <70 12/03/2006  . Essential hypertension 12/03/2006  . Coronary artery disease involving native coronary artery without angina pectoris 12/03/2006  . DIVERTICULOSIS OF COLON 05/20/2001   [name], PT, DPT # [id number] [facility], [place] 05/01/2018, 12:29 PM  [facility] [address] [place], [place], [address] Phone: [phone]   Fax:  [phone]  Name: Hunter Young MRN: [id number] Date of Birth:  04-15-1940

## 2018-05-06 ENCOUNTER — Encounter: Payer: Medicare Other | Admitting: Physical Therapy

## 2018-05-13 DIAGNOSIS — H903 Sensorineural hearing loss, bilateral: Secondary | ICD-10-CM | POA: Insufficient documentation

## 2018-05-13 DIAGNOSIS — W57XXXS Bitten or stung by nonvenomous insect and other nonvenomous arthropods, sequela: Secondary | ICD-10-CM | POA: Diagnosis not present

## 2018-05-13 DIAGNOSIS — R42 Dizziness and giddiness: Secondary | ICD-10-CM | POA: Diagnosis not present

## 2018-05-13 DIAGNOSIS — H9313 Tinnitus, bilateral: Secondary | ICD-10-CM | POA: Diagnosis not present

## 2018-05-13 DIAGNOSIS — S30861S Insect bite (nonvenomous) of abdominal wall, sequela: Secondary | ICD-10-CM | POA: Diagnosis not present

## 2018-05-13 DIAGNOSIS — L814 Other melanin hyperpigmentation: Secondary | ICD-10-CM | POA: Diagnosis not present

## 2018-05-13 DIAGNOSIS — L570 Actinic keratosis: Secondary | ICD-10-CM | POA: Diagnosis not present

## 2018-05-13 DIAGNOSIS — L821 Other seborrheic keratosis: Secondary | ICD-10-CM | POA: Diagnosis not present

## 2018-05-20 DIAGNOSIS — R42 Dizziness and giddiness: Secondary | ICD-10-CM | POA: Diagnosis not present

## 2018-05-20 DIAGNOSIS — Z8673 Personal history of transient ischemic attack (TIA), and cerebral infarction without residual deficits: Secondary | ICD-10-CM | POA: Diagnosis not present

## 2018-06-05 DIAGNOSIS — M5417 Radiculopathy, lumbosacral region: Secondary | ICD-10-CM | POA: Diagnosis not present

## 2018-06-05 DIAGNOSIS — R262 Difficulty in walking, not elsewhere classified: Secondary | ICD-10-CM | POA: Diagnosis not present

## 2018-06-07 DIAGNOSIS — R262 Difficulty in walking, not elsewhere classified: Secondary | ICD-10-CM | POA: Diagnosis not present

## 2018-06-07 DIAGNOSIS — M5417 Radiculopathy, lumbosacral region: Secondary | ICD-10-CM | POA: Diagnosis not present

## 2018-06-11 DIAGNOSIS — M5417 Radiculopathy, lumbosacral region: Secondary | ICD-10-CM | POA: Diagnosis not present

## 2018-06-11 DIAGNOSIS — R262 Difficulty in walking, not elsewhere classified: Secondary | ICD-10-CM | POA: Diagnosis not present

## 2018-06-13 DIAGNOSIS — R262 Difficulty in walking, not elsewhere classified: Secondary | ICD-10-CM | POA: Diagnosis not present

## 2018-06-13 DIAGNOSIS — M5417 Radiculopathy, lumbosacral region: Secondary | ICD-10-CM | POA: Diagnosis not present

## 2018-06-20 DIAGNOSIS — M5417 Radiculopathy, lumbosacral region: Secondary | ICD-10-CM | POA: Diagnosis not present

## 2018-06-20 DIAGNOSIS — R262 Difficulty in walking, not elsewhere classified: Secondary | ICD-10-CM | POA: Diagnosis not present

## 2018-06-24 DIAGNOSIS — R262 Difficulty in walking, not elsewhere classified: Secondary | ICD-10-CM | POA: Diagnosis not present

## 2018-06-24 DIAGNOSIS — M5417 Radiculopathy, lumbosacral region: Secondary | ICD-10-CM | POA: Diagnosis not present

## 2018-06-25 DIAGNOSIS — Z8673 Personal history of transient ischemic attack (TIA), and cerebral infarction without residual deficits: Secondary | ICD-10-CM | POA: Diagnosis not present

## 2018-06-25 DIAGNOSIS — R42 Dizziness and giddiness: Secondary | ICD-10-CM | POA: Diagnosis not present

## 2018-06-27 DIAGNOSIS — R262 Difficulty in walking, not elsewhere classified: Secondary | ICD-10-CM | POA: Diagnosis not present

## 2018-06-27 DIAGNOSIS — M5417 Radiculopathy, lumbosacral region: Secondary | ICD-10-CM | POA: Diagnosis not present

## 2018-07-02 DIAGNOSIS — R262 Difficulty in walking, not elsewhere classified: Secondary | ICD-10-CM | POA: Diagnosis not present

## 2018-07-02 DIAGNOSIS — M5417 Radiculopathy, lumbosacral region: Secondary | ICD-10-CM | POA: Diagnosis not present

## 2018-07-04 DIAGNOSIS — M5417 Radiculopathy, lumbosacral region: Secondary | ICD-10-CM | POA: Diagnosis not present

## 2018-07-04 DIAGNOSIS — R262 Difficulty in walking, not elsewhere classified: Secondary | ICD-10-CM | POA: Diagnosis not present

## 2018-07-15 ENCOUNTER — Telehealth: Payer: Self-pay

## 2018-07-15 ENCOUNTER — Other Ambulatory Visit: Payer: Self-pay

## 2018-07-15 ENCOUNTER — Ambulatory Visit (INDEPENDENT_AMBULATORY_CARE_PROVIDER_SITE_OTHER): Payer: Medicare Other | Admitting: Family Medicine

## 2018-07-15 ENCOUNTER — Telehealth: Payer: Self-pay | Admitting: Family Medicine

## 2018-07-15 ENCOUNTER — Encounter: Payer: Self-pay | Admitting: Family Medicine

## 2018-07-15 ENCOUNTER — Ambulatory Visit
Admission: RE | Admit: 2018-07-15 | Discharge: 2018-07-15 | Disposition: A | Discharge status: Home / Self Care | Payer: Medicare Other | Source: Ambulatory Visit | Attending: Family Medicine | Admitting: Family Medicine

## 2018-07-15 VITALS — BP 104/66 | HR 100 | Temp 98.4°F | Ht 66.0 in | Wt 254.2 lb

## 2018-07-15 DIAGNOSIS — M7989 Other specified soft tissue disorders: Secondary | ICD-10-CM | POA: Diagnosis not present

## 2018-07-15 DIAGNOSIS — M79604 Pain in right leg: Secondary | ICD-10-CM | POA: Diagnosis not present

## 2018-07-15 DIAGNOSIS — R2241 Localized swelling, mass and lump, right lower limb: Secondary | ICD-10-CM | POA: Diagnosis not present

## 2018-07-15 MED ORDER — FUROSEMIDE 20 MG PO TABS: 20.0000 mg | ORAL_TABLET | Freq: Every day | ORAL | 0 refills | Status: DC

## 2018-07-15 NOTE — Telephone Encounter (Signed)
Received a call report about pts US Venous RLE. IMPRESSION: No evidence of right lower extremity DVT.  Nonspecific subcutaneous fluid collection in the calf. Differential diagnosis includes hematoma, inflammatory process, or ruptured Baker's cyst. Routing high priority to office.

## 2018-07-15 NOTE — Progress Notes (Addendum)
Kelsie Zaborowski T. Devlin Mcveigh, MD Primary Care and [place] at [facility] [place], [address] Phone: [phone]  FAX: [place] - 78 y.o. male  MRN [id number]  Date of Birth: 12-08-1940  Visit Date: 07/15/2018  PCP: Owens Loffler, MD  Referred by: Owens Loffler, MD  Chief Complaint  Patient presents with  . Leg Swelling    Right   Subjective:   Hunter Young is a 78 y.o. very pleasant male patient who presents with the following:  He presents with some acute leg pain and swelling with concern for possible DVT.  He is already on eliquis for a prior occipital stroke.  Some pain and swelling and hard and tender about 1 month ago, but about 1 week ago started swelling a lot.   Some icy hot.    Currently, he is having some significant swelling, basically isolated to the right lower extremity, and significant pain, 8/10 in the posterior aspect of the calf.  Past Medical History, Surgical History, Social History, Family History, Problem List, Medications, and Allergies have been reviewed and updated if relevant.  Patient Active Problem List   Diagnosis Date Noted  . Occipital stroke ([place]) 04/02/2018    Priority: High  . Cardiomegaly 05/30/2007    Priority: High  . Coronary artery disease involving native coronary artery without angina pectoris 12/03/2006    Priority: High  . Stenosis of right carotid artery 01/29/2014    Priority: Medium  . Severe obesity (BMI >= 40) ([place]) 04/02/2013    Priority: Medium  . Hyperlipidemia LDL goal <70 12/03/2006    Priority: Medium  . Essential hypertension 12/03/2006    Priority: Medium  . OAB (overactive bladder) 03/08/2018  . CERVICAL DISORDER, NOS 10/14/2009  . GERD 12/06/2007  . BPH (benign prostatic hypertrophy) with urinary obstruction 06/25/2007  . OSTEOARTHRITIS 06/25/2007  . Allergic rhinitis 04/22/2007  . VENOUS INSUFFICIENCY 12/04/2006  .  DIVERTICULOSIS OF COLON 05/20/2001    Past Medical History:  Diagnosis Date  . ALLERGIC RHINITIS 04/22/2007  . ASTHMA, INTRINSIC NOS 12/27/2006  . BENIGN PROSTATIC HYPERTROPHY, WITH URINARY OBSTRUCTION 06/25/2007  . Cardiomegaly 05/30/2007  . Carotid stenosis 01/29/2014  . CERVICAL DISORDER, NOS 10/14/2009  . CORONARY ARTERY DISEASE 12/03/2006  . DIVERTICULOSIS OF COLON 05/20/2001  . Dyshidrosis 06/25/2007  . GERD 12/06/2007  . HYPERLIPIDEMIA 12/03/2006  . HYPERTENSION 12/03/2006  . INTERNAL HEMORRHOIDS 05/20/2001  . OBESITY, MODERATE 12/05/2008  . OSTEOARTHRITIS 06/25/2007  . VENOUS INSUFFICIENCY 12/04/2006    Past Surgical History:  Procedure Laterality Date  . ANGIOPLASTY     2 stents  . FOOT SURGERY    . JOINT REPLACEMENT     bilat knee  . TONSILLECTOMY      Social History   Socioeconomic History  . Marital status: Married    Spouse name: Not on file  . Number of children: Not on file  . Years of education: Not on file  . Highest education level: Not on file  Occupational History  . Not on file  Social Needs  . Financial resource strain: Not on file  . Food insecurity:    Worry: Not on file    Inability: Not on file  . Transportation needs:    Medical: Not on file    Non-medical: Not on file  Tobacco Use  . Smoking status: Former Smoker    Last attempt to quit: 08/21/1980    Years since quitting: 37.9  .  Smokeless tobacco: Never Used  Substance and Sexual Activity  . Alcohol use: Yes    Alcohol/week: 21.0 standard drinks    Types: 21 Standard drinks or equivalent per week  . Drug use: No  . Sexual activity: Not Currently  Lifestyle  . Physical activity:    Days per week: Not on file    Minutes per session: Not on file  . Stress: Not on file  Relationships  . Social connections:    Talks on phone: Not on file    Gets together: Not on file    Attends religious service: Not on file    Active member of club or organization: Not on file    Attends meetings of clubs  or organizations: Not on file    Relationship status: Not on file  . Intimate partner violence:    Fear of current or ex partner: Not on file    Emotionally abused: Not on file    Physically abused: Not on file    Forced sexual activity: Not on file  Other Topics Concern  . 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       Works part time for [employer].    Runs hub to [place], [place]. 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    In trucking business for a long time.    Family History  Problem Relation Age of Onset  . Colon cancer Mother 21    Allergies  Allergen Reactions  . Oxycodone-Aspirin Nausea And Vomiting    Medication list reviewed and updated in full in [place].   GEN: No acute illnesses, no fevers, chills. GI: No n/v/d, eating normally Pulm: No SOB Interactive and getting along well at home.  Otherwise, ROS is as per the HPI.  Objective:   BP 104/66   Pulse 100   Temp 98.4 F (36.9 C) (Oral)   Ht 5\' 6"  (1.676 m)   Wt 254 lb 4 oz (115.3 kg)   SpO2 96%   BMI 41.04 kg/m   GEN: WDWN, NAD, Non-toxic, A & O x 3 HEENT: Atraumatic, Normocephalic. Neck supple. No masses, No LAD. Ears and Nose: No external deformity. CV: RRR, No M/G/R. No JVD. No thrill. No extra heart sounds. PULM: CTA B, no wheezes, crackles, rhonchi. No retractions. No resp. distress. No accessory muscle use. EXTR: No c/c/2+ LE edema on the R  HOMAN"S TEST is pos on the r and neg on the L  NEURO Normal gait.  PSYCH: Normally interactive. Conversant. Not depressed or anxious appearing.  Calm demeanor.     Laboratory and Imaging Data:  Assessment and Plan:   Right leg pain - Plan: US Venous Img Lower Unilateral Right  Right leg swelling - Plan: US Venous Img Lower Unilateral Right  High level of concern for potential lower extremity DVT.  Even more concerning in a patient who is already taking Eliquis 5 mg p.o. twice daily.  Stat  ultrasound of the right lower extremity.  If this is positive, transition to 10 mg of Eliquis p.o. twice daily for 10 days.  In this setting, with a DVT on top of Eliquis twice daily, my plan will be to consult vascular surgery.  Follow-up: No follow-ups on file.  Meds ordered this encounter  Medications  . furosemide (LASIX) 20 MG tablet    Sig: Take 1 tablet (20 mg total) by mouth daily. In the morning  Dispense:  15 tablet    Refill:  0   Orders Placed This Encounter  Procedures  . US Venous Img Lower Unilateral Right    Signed,  Fancy Dunkley T. Cinda Hara, MD   Outpatient Encounter Medications as of 07/15/2018  Medication Sig  . apixaban (ELIQUIS) 5 MG TABS tablet Take 1 tablet (5 mg total) by mouth 2 (two) times daily.  Marland Kitchen atorvastatin (LIPITOR) 40 MG tablet TAKE 1 TABLET(40 MG) BY MOUTH DAILY  . metoprolol succinate (TOPROL XL) 25 MG 24 hr tablet Take 0.5 tablets (12.5 mg total) by mouth daily.  . Multiple Vitamins-Minerals (PRESERVISION AREDS) TABS Take by mouth. Take 1 tablet by mouth twice daily  . nitroGLYCERIN (NITROSTAT) 0.4 MG SL tablet Place 1 tablet (0.4 mg total) under the tongue every 5 (five) minutes as needed.  . furosemide (LASIX) 20 MG tablet Take 1 tablet (20 mg total) by mouth daily. In the morning   No facility-administered encounter medications on file as of 07/15/2018.

## 2018-07-15 NOTE — Addendum Note (Signed)
Addended by: Owens Loffler on: 07/15/2018 04:53 PM   Modules accepted: Orders

## 2018-07-15 NOTE — Telephone Encounter (Signed)
On 07/13/18 pt started with swelling and pain in lower rt leg,pain level 8-9.no CP or SOB or redness. No fever, cough; no travel and no known exposure to covid or flu. Pt already has appt with Dr Lorelei Pont 07/15/18 at 9:40.FYI to Dr Lorelei Pont.

## 2018-07-15 NOTE — Telephone Encounter (Signed)
noted 

## 2018-07-16 NOTE — Telephone Encounter (Signed)
Known previously handled.

## 2018-07-22 ENCOUNTER — Encounter: Payer: Self-pay | Admitting: Family Medicine

## 2018-07-22 ENCOUNTER — Other Ambulatory Visit: Payer: Self-pay

## 2018-07-22 ENCOUNTER — Ambulatory Visit (INDEPENDENT_AMBULATORY_CARE_PROVIDER_SITE_OTHER): Payer: Medicare Other | Admitting: Family Medicine

## 2018-07-22 VITALS — BP 108/60 | HR 94 | Temp 98.5°F | Ht 66.0 in | Wt 251.5 lb

## 2018-07-22 DIAGNOSIS — M79604 Pain in right leg: Secondary | ICD-10-CM

## 2018-07-22 DIAGNOSIS — S8011XA Contusion of right lower leg, initial encounter: Secondary | ICD-10-CM | POA: Diagnosis not present

## 2018-07-22 DIAGNOSIS — M7989 Other specified soft tissue disorders: Secondary | ICD-10-CM | POA: Diagnosis not present

## 2018-07-22 MED ORDER — TRAMADOL HCL 50 MG PO TABS: 50.0000 mg | ORAL_TABLET | Freq: Three times a day (TID) | ORAL | 0 refills | Status: DC | PRN

## 2018-07-22 NOTE — Patient Instructions (Signed)
Hematoma  A hematoma is a collection of blood. A hematoma can happen:  · Under the skin.  · In an organ.  · In a body space.  · In a joint space.  · In other tissues.  The blood can thicken (clot) to form a lump that you can see and feel. The lump is often hard and may become sore and tender. The lump can be very small or very big. Most hematomas get better in a few days to weeks. However, some hematomas may be serious and need medical care.  What are the causes?  This condition is caused by:  · An injury.  · Blood that leaks under the skin.  · Problems from surgeries.  · Medical conditions that cause bleeding or bruising.  What increases the risk?  You are more likely to develop this condition if:  · You are an older adult.  · You use medicines that thin your blood.  What are the signs or symptoms?  Symptoms depend on where the hematoma is in your body.  · If the hematoma is under the skin, there is:  ? A firm lump on the body.  ? Pain and tenderness in the area.  ? Bruising. The skin above the lump may be blue, dark blue, purple-red, or yellowish.  · If the hematoma is deep in the tissues or body spaces, there may be:  ? Blood in the stomach. This may cause pain in the belly (abdomen), weakness, passing out (fainting), and shortness of breath.  ? Blood in the head. This may cause a headache, weakness, trouble speaking or understanding speech, or passing out.  How is this diagnosed?  This condition is diagnosed based on:  · Your medical history.  · A physical exam.  · Imaging tests, such as ultrasound or CT scan.  · Blood tests.  How is this treated?  Treatment depends on the cause, size, and location of the hematoma. Treatment may include:  · Doing nothing. Many hematomas go away on their own without treatment.  · Surgery or close monitoring. This may be needed for large hematomas or hematomas that affect the body's organs.  · Medicines. These may be given if a medical condition caused the hematoma.  Follow these  instructions at home:  Managing pain, stiffness, and swelling    · If told, put ice on the area.  ? Put ice in a plastic bag.  ? Place a towel between your skin and the bag.  ? Leave the ice on for 20 minutes, 2-3 times a day for the first two days.  · If told, put heat on the affected area after putting ice on the area for two days. Use the heat source that your doctor tells you to use. This could be a moist heat pack or a heating pad. To do this:  ? Place a towel between your skin and the heat source.  ? Leave the heat on for 20-30 minutes.  ? Remove the heat if your skin turns bright red. This is very important if you are unable to feel pain, heat, or cold. You may have a greater risk of getting burned.  · Raise (elevate) the affected area above the level of your heart while you are sitting or lying down.  · Wrap the affected area with an elastic bandage, if told by your doctor. Do not wrap the bandage too tightly.  · If your hematoma is on a leg or foot and   is painful, your doctor may give you crutches. Use them as told by your doctor.  General instructions  · Take over-the-counter and prescription medicines only as told by your doctor.  · Keep all follow-up visits as told by your doctor. This is important.  Contact a doctor if:  · You have a fever.  · The swelling or bruising gets worse.  · You start to get more hematomas.  Get help right away if:  · Your pain gets worse.  · Your pain is not getting better with medicine.  · Your skin over the hematoma breaks or starts to bleed.  · Your hematoma is in your chest or belly and you:  ? Pass out.  ? Feel weak.  ? Become short of breath.  · You have a hematoma on your scalp that is caused by a fall or injury, and you:  ? Have a headache that gets worse.  ? Have trouble speaking or understanding speech.  ? Become less alert or you pass out.  Summary  · A hematoma is a collection of blood in any part of your body.  · Most hematomas get better on their own in a few days  to weeks. Some may need medical care.  · Follow instructions from your doctor about how to care for your hematoma.  · Contact a doctor if the swelling or bruising gets worse, or if you are short of breath.  This information is not intended to replace advice given to you by your health care provider. Make sure you discuss any questions you have with your health care provider.  Document Released: 04/20/2004 Document Revised: 08/16/2017 Document Reviewed: 08/16/2017  Elsevier Interactive Patient Education © 2019 Elsevier Inc.

## 2018-07-22 NOTE — Progress Notes (Signed)
Hunter Meroney T. Lakesia Dahle, MD Primary Care and [place] at [facility] [place], [address] Phone: [phone]   FAX: [place] - 78 y.o. male   MRN [id number]   Date of Birth: 09/05/1940  Visit Date: 07/22/2018   PCP: Owens Loffler, MD   Referred by: Owens Loffler, MD  Chief Complaint  Patient presents with   Foot Swelling    Right   Leg Swelling    Right   Subjective:   Hunter Young is a 78 y.o. very pleasant male patient who presents with the following:  Ongoing R leg swelling in a setting of fairly large hematoma.  Now he has some relatively extensive bruising in the lower extremity.  He still having pain.  He is quite frustrated.  9.5 x 2.3  X 7.0 cm hematoma  eliquis    Past Medical History, Surgical History, Social History, Family History, Problem List, Medications, and Allergies have been reviewed and updated if relevant.  Patient Active Problem List   Diagnosis Date Noted   Occipital stroke ([place]) 04/02/2018    Priority: High   Cardiomegaly 05/30/2007    Priority: High   Coronary artery disease involving native coronary artery without angina pectoris 12/03/2006    Priority: High   Stenosis of right carotid artery 01/29/2014    Priority: Medium   Severe obesity (BMI >= 40) ([place]) 04/02/2013    Priority: Medium   Hyperlipidemia LDL goal <70 12/03/2006    Priority: Medium   Essential hypertension 12/03/2006    Priority: Medium   OAB (overactive bladder) 03/08/2018   CERVICAL DISORDER, NOS 10/14/2009   GERD 12/06/2007   BPH (benign prostatic hypertrophy) with urinary obstruction 06/25/2007   OSTEOARTHRITIS 06/25/2007   Allergic rhinitis 04/22/2007   VENOUS INSUFFICIENCY 12/04/2006   DIVERTICULOSIS OF COLON 05/20/2001    Past Medical History:  Diagnosis Date   ALLERGIC RHINITIS 04/22/2007   ASTHMA, INTRINSIC NOS 12/27/2006   BENIGN PROSTATIC HYPERTROPHY, WITH  URINARY OBSTRUCTION 06/25/2007   Cardiomegaly 05/30/2007   Carotid stenosis 01/29/2014   CERVICAL DISORDER, NOS 10/14/2009   CORONARY ARTERY DISEASE 12/03/2006   DIVERTICULOSIS OF COLON 05/20/2001   Dyshidrosis 06/25/2007   GERD 12/06/2007   HYPERLIPIDEMIA 12/03/2006   HYPERTENSION 12/03/2006   INTERNAL HEMORRHOIDS 05/20/2001   OBESITY, MODERATE 12/05/2008   OSTEOARTHRITIS 06/25/2007   VENOUS INSUFFICIENCY 12/04/2006    Past Surgical History:  Procedure Laterality Date   ANGIOPLASTY     2 stents   FOOT SURGERY     JOINT REPLACEMENT     bilat knee   TONSILLECTOMY      Social History   Socioeconomic History   Marital status: Married    Spouse name: Not on file   Number of children: Not on file   Years of education: Not on file   Highest education level: Not on file  Occupational History   Not on file  Social Needs   Financial resource strain: Not on file   Food insecurity:    Worry: Not on file    Inability: Not on file   Transportation needs:    Medical: Not on file    Non-medical: Not on file  Tobacco Use   Smoking status: Former Smoker    Last attempt to quit: 08/21/1980    Years since quitting: 37.9   Smokeless tobacco: Never Used  Substance and Sexual Activity   Alcohol use: Yes    Alcohol/week: 21.0  standard drinks    Types: 21 Standard drinks or equivalent per week   Drug use: No   Sexual activity: Not Currently  Lifestyle   Physical activity:    Days per week: Not on file    Minutes per session: Not on file   Stress: Not on file  Relationships   Social connections:    Talks on phone: Not on file    Gets together: Not on file    Attends religious service: Not on file    Active member of club or organization: Not on file    Attends meetings of clubs or organizations: Not on file    Relationship status: Not on file   Intimate partner violence:    Fear of current or ex partner: Not on file    Emotionally abused: Not on file     Physically abused: Not on file    Forced sexual activity: Not on file  Other Topics Concern   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       Works part time for [employer].    Runs hub to [place], . 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    In trucking business for a long time.    Family History  Problem Relation Age of Onset   Colon cancer Mother 58    Allergies  Allergen Reactions   Oxycodone-Aspirin Nausea And Vomiting    Medication list reviewed and updated in full in [place].   GEN: No acute illnesses, no fevers, chills. GI: No n/v/d, eating normally Pulm: No SOB Interactive and getting along well at home.  Otherwise, ROS is as per the HPI.  Objective:   BP 108/60    Pulse 94    Temp 98.5 F (36.9 C) (Oral)    Ht 5\' 6"  (1.676 m)    Wt 251 lb 8 oz (114.1 kg)    BMI 40.59 kg/m   GEN: WDWN, NAD, Non-toxic, A & O x 3 HEENT: Atraumatic, Normocephalic. Neck supple. No masses, No LAD. Ears and Nose: No external deformity. NEURO Normal gait.  PSYCH: Normally interactive. Conversant. Not depressed or anxious appearing.  Calm demeanor.   1-2+ lower extremity edema.  He does have some tenderness with squeezing of the calf as well as around the ankle.  There is some 2+ pedal edema as well.  Ecchymosis is present mostly distally.  Laboratory and Imaging Data:  Assessment and Plan:   Hematoma of leg, right, initial encounter  Right leg pain  Right leg swelling  >25 minutes spent in face to face time with patient, >50% spent in counselling or coordination of care   He is extremely frustrated.  I explained what a hematoma was multiple times in the office, and he required multiple explanations, and I hope that he understood me.  Treatment in this case would be watchful waiting only and no active intervention.  This should resolve on its own in 6 to 8 weeks.  He is quite frustrated.  Recommended Tylenol as well as  tramadol as needed.  I reviewed some basic limitations with him as well.   Patient Instructions  Hematoma A hematoma is a collection of blood. A hematoma can happen:  Under the skin.  In an organ.  In a body space.  In a joint space.  In other tissues. The blood can thicken (clot) to form a lump that you can see and feel.  The lump is often hard and may become sore and tender. The lump can be very small or very big. Most hematomas get better in a few days to weeks. However, some hematomas may be serious and need medical care. What are the causes? This condition is caused by:  An injury.  Blood that leaks under the skin.  Problems from surgeries.  Medical conditions that cause bleeding or bruising. What increases the risk? You are more likely to develop this condition if:  You are an older adult.  You use medicines that thin your blood. What are the signs or symptoms? Symptoms depend on where the hematoma is in your body.  If the hematoma is under the skin, there is: ? A firm lump on the body. ? Pain and tenderness in the area. ? Bruising. The skin above the lump may be blue, dark blue, purple-red, or yellowish.  If the hematoma is deep in the tissues or body spaces, there may be: ? Blood in the stomach. This may cause pain in the belly (abdomen), weakness, passing out (fainting), and shortness of breath. ? Blood in the head. This may cause a headache, weakness, trouble speaking or understanding speech, or passing out. How is this diagnosed? This condition is diagnosed based on:  Your medical history.  A physical exam.  Imaging tests, such as ultrasound or CT scan.  Blood tests. How is this treated? Treatment depends on the cause, size, and location of the hematoma. Treatment may include:  Doing nothing. Many hematomas go away on their own without treatment.  Surgery or close monitoring. This may be needed for large hematomas or hematomas that affect the  body's organs.  Medicines. These may be given if a medical condition caused the hematoma. Follow these instructions at home: Managing pain, stiffness, and swelling   If told, put ice on the area. ? Put ice in a plastic bag. ? Place a towel between your skin and the bag. ? Leave the ice on for 20 minutes, 2-3 times a day for the first two days.  If told, put heat on the affected area after putting ice on the area for two days. Use the heat source that your doctor tells you to use. This could be a moist heat pack or a heating pad. To do this: ? Place a towel between your skin and the heat source. ? Leave the heat on for 20-30 minutes. ? Remove the heat if your skin turns bright red. This is very important if you are unable to feel pain, heat, or cold. You may have a greater risk of getting burned.  Raise (elevate) the affected area above the level of your heart while you are sitting or lying down.  Wrap the affected area with an elastic bandage, if told by your doctor. Do not wrap the bandage too tightly.  If your hematoma is on a leg or foot and is painful, your doctor may give you crutches. Use them as told by your doctor. General instructions  Take over-the-counter and prescription medicines only as told by your doctor.  Keep all follow-up visits as told by your doctor. This is important. Contact a doctor if:  You have a fever.  The swelling or bruising gets worse.  You start to get more hematomas. Get help right away if:  Your pain gets worse.  Your pain is not getting better with medicine.  Your skin over the hematoma breaks or starts to bleed.  Your hematoma is in  your chest or belly and you: ? Pass out. ? Feel weak. ? Become short of breath.  You have a hematoma on your scalp that is caused by a fall or injury, and you: ? Have a headache that gets worse. ? Have trouble speaking or understanding speech. ? Become less alert or you pass out. Summary  A hematoma  is a collection of blood in any part of your body.  Most hematomas get better on their own in a few days to weeks. Some may need medical care.  Follow instructions from your doctor about how to care for your hematoma.  Contact a doctor if the swelling or bruising gets worse, or if you are short of breath. This information is not intended to replace advice given to you by your health care provider. Make sure you discuss any questions you have with your health care provider. Document Released: 04/20/2004 Document Revised: 08/16/2017 Document Reviewed: 08/16/2017 Elsevier Interactive Patient Education  2019 [employer].     Follow-up: No follow-ups on file.  Meds ordered this encounter  Medications   traMADol (ULTRAM) 50 MG tablet    Sig: Take 1 tablet (50 mg total) by mouth every 8 (eight) hours as needed for moderate pain.    Dispense:  20 tablet    Refill:  0    Signed,  Obbie Lewallen T. Jaine Estabrooks, MD   Outpatient Encounter Medications as of 07/22/2018  Medication Sig   apixaban (ELIQUIS) 5 MG TABS tablet Take 1 tablet (5 mg total) by mouth 2 (two) times daily.   atorvastatin (LIPITOR) 40 MG tablet TAKE 1 TABLET(40 MG) BY MOUTH DAILY   metoprolol succinate (TOPROL XL) 25 MG 24 hr tablet Take 0.5 tablets (12.5 mg total) by mouth daily.   Multiple Vitamins-Minerals (PRESERVISION AREDS) TABS Take by mouth. Take 1 tablet by mouth twice daily   nitroGLYCERIN (NITROSTAT) 0.4 MG SL tablet Place 1 tablet (0.4 mg total) under the tongue every 5 (five) minutes as needed.   traMADol (ULTRAM) 50 MG tablet Take 1 tablet (50 mg total) by mouth every 8 (eight) hours as needed for moderate pain.   [DISCONTINUED] furosemide (LASIX) 20 MG tablet Take 1 tablet (20 mg total) by mouth daily. In the morning   No facility-administered encounter medications on file as of 07/22/2018.

## 2018-08-13 ENCOUNTER — Ambulatory Visit: Payer: Medicare Other

## 2018-08-15 ENCOUNTER — Encounter: Payer: Medicare Other | Admitting: Family Medicine

## 2018-08-27 DIAGNOSIS — H353221 Exudative age-related macular degeneration, left eye, with active choroidal neovascularization: Secondary | ICD-10-CM | POA: Diagnosis not present

## 2018-08-27 DIAGNOSIS — H43811 Vitreous degeneration, right eye: Secondary | ICD-10-CM | POA: Diagnosis not present

## 2018-08-27 DIAGNOSIS — H43392 Other vitreous opacities, left eye: Secondary | ICD-10-CM | POA: Diagnosis not present

## 2018-08-27 DIAGNOSIS — Z961 Presence of intraocular lens: Secondary | ICD-10-CM | POA: Diagnosis not present

## 2018-08-27 DIAGNOSIS — H35342 Macular cyst, hole, or pseudohole, left eye: Secondary | ICD-10-CM | POA: Diagnosis not present

## 2018-09-14 ENCOUNTER — Other Ambulatory Visit: Payer: Self-pay | Admitting: Family Medicine

## 2018-09-23 ENCOUNTER — Other Ambulatory Visit: Payer: Self-pay | Admitting: Family Medicine

## 2018-10-11 NOTE — Progress Notes (Signed)
Cardiology Office Note  Date:  10/15/2018   ID:  Hunter Young, DOB Apr 03, 1940, MRN [id number]  PCP:  Owens Loffler, MD   Chief Complaint  Patient presents with  . Other    6 month follow up. patient denies chest pain and SOB. Meds reviewed verbally with patient.     HPI:  Hunter Young is a very pleasant 78 year old gentleman with a history of  Stroke Permanent Atrial fibrillation CAD stenting to his left circumflex, last catheterization in 2007 showing 40% proximal LAD disease, 70-80% mid RCA disease, nondominant vessel.  <39% b/l carotid disease in 2019 Periodic ETOH abuse He presents for routine followup of his coronary artery disease, atrial fibrillation  In general does not know he is in atrial fibrillation Balance problems Shearon Stalls at times  No regular exercise program Legs feel weak, knee pain  Denies any shortness of breath on exertion, gives out with walking No leg edema or fluid retention  Echocardiogram 10/2017  reviewed mildly depressed ejection fraction 45 to 50%  moderately dilated left atrium  snoring, possible sleep apnea  EKG personally 10/2017 Shows atrial fibrillation ventricular rate 86 bpm PVCs nonspecific ST-T wave abnormality  Other past medical history reviewed Seen in the office with Dr. Edilia Bo 10/10/2017 Had stroke type symptoms  speech, left hand and arm  Was sent for MRI MRI of the brain (10/17/17)  showed an acute infarction in the right occipital lobe Atrial fib noted on echo 11/01/2017 Started on eliquis 11/01/2017   PMH:   has a past medical history of ALLERGIC RHINITIS (04/22/2007), ASTHMA, INTRINSIC NOS (12/27/2006), BENIGN PROSTATIC HYPERTROPHY, WITH URINARY OBSTRUCTION (06/25/2007), Cardiomegaly (05/30/2007), Carotid stenosis (01/29/2014), CERVICAL DISORDER, NOS (10/14/2009), CORONARY ARTERY DISEASE (12/03/2006), DIVERTICULOSIS OF COLON (05/20/2001), Dyshidrosis (06/25/2007), GERD (12/06/2007), HYPERLIPIDEMIA (12/03/2006), HYPERTENSION (12/03/2006),  INTERNAL HEMORRHOIDS (05/20/2001), OBESITY, MODERATE (12/05/2008), OSTEOARTHRITIS (06/25/2007), and VENOUS INSUFFICIENCY (12/04/2006).  PSH:    Past Surgical History:  Procedure Laterality Date  . ANGIOPLASTY     2 stents  . FOOT SURGERY    . JOINT REPLACEMENT     bilat knee  . TONSILLECTOMY      Current Outpatient Medications  Medication Sig Dispense Refill  . apixaban (ELIQUIS) 5 MG TABS tablet Take 1 tablet (5 mg total) by mouth 2 (two) times daily. 180 tablet 3  . atorvastatin (LIPITOR) 40 MG tablet TAKE 1 TABLET(40 MG) BY MOUTH DAILY 93 tablet 0  . metoprolol succinate (TOPROL XL) 25 MG 24 hr tablet Take 0.5 tablets (12.5 mg total) by mouth daily. 90 tablet 3  . Multiple Vitamins-Minerals (PRESERVISION AREDS) TABS Take by mouth. Take 1 tablet by mouth twice daily    . nitroGLYCERIN (NITROSTAT) 0.4 MG SL tablet Place 1 tablet (0.4 mg total) under the tongue every 5 (five) minutes as needed. 25 tablet 6   No current facility-administered medications for this visit.      Allergies:   Oxycodone-aspirin   Social History:  The patient  reports that he quit smoking about 38 years ago. He has never used smokeless tobacco. He reports current alcohol use of about 21.0 standard drinks of alcohol per week. He reports that he does not use drugs.   Family History:   family history includes Colon cancer (age of onset: 39) in his mother.    Review of Systems: Review of Systems  Constitutional: Negative.   HENT: Negative.   Respiratory: Negative.   Cardiovascular: Negative.   Gastrointestinal: Negative.   Musculoskeletal: Negative.   Neurological: Negative.   Psychiatric/Behavioral:  Negative.   All other systems reviewed and are negative.    PHYSICAL EXAM: VS:  BP 130/66 (BP Location: Left Arm, Patient Position: Sitting, Cuff Size: Normal)   Pulse 86   Ht 5\' 6"  (1.676 m)   Wt 250 lb 4 oz (113.5 kg)   BMI 40.39 kg/m  , BMI Body mass index is 40.39 kg/m. Constitutional:  oriented  to person, place, and time. No distress.  HENT:  Head: Grossly normal Eyes:  no discharge. No scleral icterus.  Neck: No JVD, no carotid bruits  Cardiovascular: Irregularly irregular , no murmurs appreciated Pulmonary/Chest: Clear to auscultation bilaterally, no wheezes or rails Abdominal: Soft.  no distension.  no tenderness.  Musculoskeletal: Normal range of motion Neurological:  normal muscle tone. Coordination normal. No atrophy Skin: Skin warm and dry Psychiatric: normal affect, pleasant   Recent Labs: 04/03/2018: ALT 36; BUN 18; Creatinine, Ser 0.96; Hemoglobin 16.0; Platelets 196.0; Potassium 4.5; Sodium 140; TSH 1.12    Lipid Panel Lab Results  Component Value Date   CHOL 152 07/31/2017   HDL 56.60 07/31/2017   LDLCALC 82 07/31/2017   TRIG 64.0 07/31/2017      Wt Readings from Last 3 Encounters:  10/15/18 250 lb 4 oz (113.5 kg)  07/22/18 251 lb 8 oz (114.1 kg)  07/15/18 254 lb 4 oz (115.3 kg)      ASSESSMENT AND PLAN:  Persistent atrial fibrillation On prior clinic visit had discussion concerning management of his atrial fibrillation, he preferred medical management with rate control No medication changes made  Hyperlipidemia LDL goal <70 - Plan: EKG 12-Lead Cholesterol reasonable, no changes made  Essential hypertension - Plan: EKG 12-Lead  metoprolol succinate 25 daily Blood pressure and heart rate stable  Coronary artery disease involving native coronary artery of native heart without angina pectoris - Plan: EKG 12-Lead Denies anginal symptoms, no testing at this time  Stenosis of right carotid artery - Plan: EKG 12-Lead 40 to 50% stenosis on the right October 2014 1-39% stenosis bilaterally August 2019 Continue Lipitor, goal LDL preferably less than 70  Severe obesity (BMI >= 40) ([place]) - Plan: EKG 12-Lead Recommended dietary changes and walking program Walking program   Total encounter time more than 25 minutes  Greater than 50% was spent in  counseling and coordination of care with the patient   Disposition:   F/U  12 month   Orders Placed This Encounter  Procedures  . EKG 12-Lead     Signed, Esmond Plants, M.D., Ph.D. 10/15/2018  [place], [place]

## 2018-10-14 ENCOUNTER — Telehealth: Payer: Self-pay | Admitting: Cardiovascular Disease

## 2018-10-14 NOTE — Telephone Encounter (Signed)

## 2018-10-15 ENCOUNTER — Encounter: Payer: Self-pay | Admitting: Cardiovascular Disease

## 2018-10-15 ENCOUNTER — Ambulatory Visit (INDEPENDENT_AMBULATORY_CARE_PROVIDER_SITE_OTHER): Payer: Medicare Other | Admitting: Cardiovascular Disease

## 2018-10-15 ENCOUNTER — Other Ambulatory Visit: Payer: Self-pay

## 2018-10-15 VITALS — BP 130/66 | HR 86 | Ht 66.0 in | Wt 250.2 lb

## 2018-10-15 DIAGNOSIS — I4819 Other persistent atrial fibrillation: Secondary | ICD-10-CM

## 2018-10-15 DIAGNOSIS — I739 Peripheral vascular disease, unspecified: Secondary | ICD-10-CM

## 2018-10-15 DIAGNOSIS — I25118 Atherosclerotic heart disease of native coronary artery with other forms of angina pectoris: Secondary | ICD-10-CM | POA: Diagnosis not present

## 2018-10-15 DIAGNOSIS — I6310 Cerebral infarction due to embolism of unspecified precerebral artery: Secondary | ICD-10-CM | POA: Diagnosis not present

## 2018-10-15 DIAGNOSIS — E785 Hyperlipidemia, unspecified: Secondary | ICD-10-CM

## 2018-10-15 DIAGNOSIS — I10 Essential (primary) hypertension: Secondary | ICD-10-CM

## 2018-10-15 DIAGNOSIS — I6521 Occlusion and stenosis of right carotid artery: Secondary | ICD-10-CM

## 2018-10-15 NOTE — Patient Instructions (Signed)

## 2018-10-16 ENCOUNTER — Other Ambulatory Visit: Payer: Self-pay | Admitting: Family Medicine

## 2018-10-16 DIAGNOSIS — Z79899 Other long term (current) drug therapy: Secondary | ICD-10-CM

## 2018-10-16 DIAGNOSIS — E785 Hyperlipidemia, unspecified: Secondary | ICD-10-CM

## 2018-10-18 ENCOUNTER — Other Ambulatory Visit: Payer: Self-pay

## 2018-10-18 ENCOUNTER — Other Ambulatory Visit (INDEPENDENT_AMBULATORY_CARE_PROVIDER_SITE_OTHER): Payer: Medicare Other

## 2018-10-18 DIAGNOSIS — Z79899 Other long term (current) drug therapy: Secondary | ICD-10-CM | POA: Diagnosis not present

## 2018-10-18 DIAGNOSIS — R739 Hyperglycemia, unspecified: Secondary | ICD-10-CM

## 2018-10-18 DIAGNOSIS — E785 Hyperlipidemia, unspecified: Secondary | ICD-10-CM | POA: Diagnosis not present

## 2018-10-18 LAB — CBC WITH DIFFERENTIAL/PLATELET
Basophils Absolute: 0 10*3/uL (ref 0.0–0.1)
Basophils Relative: 0.3 % (ref 0.0–3.0)
Eosinophils Absolute: 0.1 10*3/uL (ref 0.0–0.7)
Eosinophils Relative: 1.8 % (ref 0.0–5.0)
HCT: 46.8 % (ref 39.0–52.0)
Hemoglobin: 15.1 g/dL (ref 13.0–17.0)
Lymphocytes Relative: 34.5 % (ref 12.0–46.0)
Lymphs Abs: 2.4 10*3/uL (ref 0.7–4.0)
MCHC: 32.2 g/dL (ref 30.0–36.0)
MCV: 90.5 fl (ref 78.0–100.0)
Monocytes Absolute: 0.6 10*3/uL (ref 0.1–1.0)
Monocytes Relative: 8.5 % (ref 3.0–12.0)
Neutro Abs: 3.8 10*3/uL (ref 1.4–7.7)
Neutrophils Relative %: 54.9 % (ref 43.0–77.0)
Platelets: 176 10*3/uL (ref 150.0–400.0)
RBC: 5.17 Mil/uL (ref 4.22–5.81)
RDW: 14.2 % (ref 11.5–15.5)
WBC: 6.9 10*3/uL (ref 4.0–10.5)

## 2018-10-18 LAB — HEPATIC FUNCTION PANEL
ALT: 39 U/L (ref 0–53)
AST: 27 U/L (ref 0–37)
Albumin: 3.9 g/dL (ref 3.5–5.2)
Alkaline Phosphatase: 76 U/L (ref 39–117)
Bilirubin, Direct: 0.1 mg/dL (ref 0.0–0.3)
Total Bilirubin: 0.4 mg/dL (ref 0.2–1.2)
Total Protein: 6.2 g/dL (ref 6.0–8.3)

## 2018-10-18 LAB — BASIC METABOLIC PANEL
BUN: 23 mg/dL (ref 6–23)
CO2: 27 mEq/L (ref 19–32)
Calcium: 9.4 mg/dL (ref 8.4–10.5)
Chloride: 105 mEq/L (ref 96–112)
Creatinine, Ser: 1.03 mg/dL (ref 0.40–1.50)
GFR: 69.44 mL/min (ref >60.00)
Glucose, Bld: 145 mg/dL — ABNORMAL HIGH (ref 70–99)
Potassium: 4.1 mEq/L (ref 3.5–5.1)
Sodium: 140 mEq/L (ref 135–145)

## 2018-10-18 LAB — LIPID PANEL
Cholesterol: 142 mg/dL (ref 0–200)
HDL: 44.3 mg/dL (ref >39.00)
LDL Cholesterol: 82 mg/dL (ref 0–99)
NonHDL: 98.13
Total CHOL/HDL Ratio: 3
Triglycerides: 83 mg/dL (ref 0.0–149.0)
VLDL: 16.6 mg/dL (ref 0.0–40.0)

## 2018-10-18 LAB — HEMOGLOBIN A1C: Hgb A1c MFr Bld: 6.1 % (ref 4.6–6.5)

## 2018-10-20 NOTE — Progress Notes (Signed)
Ithzel Fedorchak T. Ryleeann Urquiza, MD Primary Care and [place] at [facility] [place], [address] Phone: [phone]  FAX: [place] - 78 y.o. male  MRN [id number]  Date of Birth: 12-31-1940  Visit Date: 10/23/2018  PCP: Owens Loffler, MD  Referred by: Owens Loffler, MD  Chief Complaint  Patient presents with  . Medicare Wellness   Patient Care Team: Owens Loffler, MD as PCP - General (Family Medicine) Minna Merritts, MD as Consulting Physician (Cardiology) Subjective:   WADSWORTH SKOLNICK is a 78 y.o. pleasant patient who presents for a medicare wellness examination and Complete Physical Exam.  Preventative Health Maintenance Visit:  Health Maintenance Summary Reviewed and updated, unless pt declines services.  Tobacco History Reviewed. Alcohol: No concerns, no excessive use Exercise Habits: Some activity, rec at least 30 mins 5 times a week STD concerns: no risk or activity to increase risk Drug Use: None Encouraged self-testicular check  Overactive bladder - 3-4 times in the night. Some issues with his stream.  Incompletely emptying.  PSA has been normal.     Health Maintenance  Topic Date Due  . COLONOSCOPY  08/26/2017  . TETANUS/TDAP  07/23/2018  . INFLUENZA VACCINE  10/26/2018  . PNA vac Low Risk Adult  Completed    Immunization History  Administered Date(s) Administered  . Hepatitis B 07/22/2008  . Influenza Split 01/19/2011, 12/21/2011  . Influenza Whole 01/10/2007, 12/25/2008  . Influenza, High Dose Seasonal PF 01/10/2017  . Influenza,inj,Quad PF,6+ Mos 12/16/2012, 01/28/2014, 02/11/2015, 11/25/2015, 04/03/2018  . Pneumococcal Conjugate-13 01/28/2014  . Pneumococcal Polysaccharide-23 03/27/2004, 01/19/2011  . Td 03/27/1998, 07/22/2008    Patient Active Problem List   Diagnosis Date Noted  . Occipital stroke ([place]) 04/02/2018    Priority: High  . Cardiomegaly 05/30/2007   Priority: High  . Coronary artery disease involving native coronary artery without angina pectoris 12/03/2006    Priority: High  . Stenosis of right carotid artery 01/29/2014    Priority: Medium  . Severe obesity (BMI >= 40) ([place]) 04/02/2013    Priority: Medium  . Hyperlipidemia LDL goal <70 12/03/2006    Priority: Medium  . Essential hypertension 12/03/2006    Priority: Medium  . OAB (overactive bladder) 03/08/2018  . CERVICAL DISORDER, NOS 10/14/2009  . GERD 12/06/2007  . BPH (benign prostatic hypertrophy) with urinary obstruction 06/25/2007  . OSTEOARTHRITIS 06/25/2007  . Allergic rhinitis 04/22/2007  . VENOUS INSUFFICIENCY 12/04/2006  . DIVERTICULOSIS OF COLON 05/20/2001   Past Medical History:  Diagnosis Date  . ALLERGIC RHINITIS 04/22/2007  . ASTHMA, INTRINSIC NOS 12/27/2006  . BENIGN PROSTATIC HYPERTROPHY, WITH URINARY OBSTRUCTION 06/25/2007  . Cardiomegaly 05/30/2007  . Carotid stenosis 01/29/2014  . CERVICAL DISORDER, NOS 10/14/2009  . CORONARY ARTERY DISEASE 12/03/2006  . DIVERTICULOSIS OF COLON 05/20/2001  . Dyshidrosis 06/25/2007  . GERD 12/06/2007  . HYPERLIPIDEMIA 12/03/2006  . HYPERTENSION 12/03/2006  . INTERNAL HEMORRHOIDS 05/20/2001  . OBESITY, MODERATE 12/05/2008  . OSTEOARTHRITIS 06/25/2007  . VENOUS INSUFFICIENCY 12/04/2006   Past Surgical History:  Procedure Laterality Date  . ANGIOPLASTY     2 stents  . FOOT SURGERY    . JOINT REPLACEMENT     bilat knee  . TONSILLECTOMY     Social History   Socioeconomic History  . Marital status: Married    Spouse name: Not on file  . Number of children: Not on file  . Years of education: Not on file  .  Highest education level: Not on file  Occupational History  . Not on file  Social Needs  . Financial resource strain: Not on file  . Food insecurity    Worry: Not on file    Inability: Not on file  . Transportation needs    Medical: Not on file    Non-medical: Not on file  Tobacco Use  . Smoking status: Former  Smoker    Quit date: 08/21/1980    Years since quitting: 38.1  . Smokeless tobacco: Never Used  Substance and Sexual Activity  . Alcohol use: Yes    Alcohol/week: 21.0 standard drinks    Types: 21 Standard drinks or equivalent per week  . Drug use: No  . Sexual activity: Not Currently  Lifestyle  . Physical activity    Days per week: Not on file    Minutes per session: Not on file  . Stress: Not on file  Relationships  . Social Herbalist on phone: Not on file    Gets together: Not on file    Attends religious service: Not on file    Active member of club or organization: Not on file    Attends meetings of clubs or organizations: Not on file    Relationship status: Not on file  . Intimate partner violence    Fear of current or ex partner: Not on file    Emotionally abused: Not on file    Physically abused: Not on file    Forced sexual activity: Not on file  Other Topics Concern  . 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       Works part time for [employer].    Runs hub to 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    In trucking business for a long time.   Family History  Problem Relation Age of Onset  . Colon cancer Mother 2   Allergies  Allergen Reactions  . Oxycodone-Aspirin Nausea And Vomiting    Medication list has been reviewed and updated.   General: Denies fever, chills, sweats. No significant weight loss. Eyes: Denies blurring,significant itching ENT: Denies earache, sore throat, and hoarseness. Cardiovascular: Denies chest pains, palpitations, dyspnea on exertion Respiratory: Denies cough, dyspnea at rest,wheeezing Breast: no concerns about lumps GI: Denies nausea, vomiting, diarrhea, constipation, change in bowel habits, abdominal pain, melena, hematochezia GU: Denies penile discharge, ED, urinary flow / outflow problems. No STD concerns. Musculoskeletal: Denies back pain, joint pain Derm:  Denies rash, itching Neuro: Denies  paresthesias, frequent falls, frequent headaches Psych: Denies depression, anxiety Endocrine: Denies cold intolerance, heat intolerance, polydipsia Heme: Denies enlarged lymph nodes Allergy: No hayfever  Objective:   BP 118/78   Pulse 84   Temp 97.8 F (36.6 C) (Temporal)   Ht 5' 6.5" (1.689 m)   Wt 252 lb 4 oz (114.4 kg)   SpO2 97%   BMI 40.10 kg/m  Fall Risk  10/23/2018 07/31/2017 07/26/2016 07/26/2015 01/28/2014  Falls in the past year? 1 No No Yes No  Number falls in past yr: 0 - - 1 -  Injury with Fall? 0 - - Yes -   Ideal Body Weight: Weight in (lb) to have BMI = 25: 156.9  Hearing Screening   Method: Audiometry   125Hz  250Hz  500Hz  1000Hz  2000Hz  3000Hz  4000Hz  6000Hz  8000Hz   Right ear:   20 20 20   0    Left ear:  20 20 20  20     Vision Screening Comments: Eye Exam with [facility] Jacklynn Ganong at [facility] on 08/27/2018 Depression screen [facility] 2/9 10/23/2018 07/31/2017 07/26/2016 07/26/2015 01/28/2014  Decreased Interest 0 0 0 0 0  Down, Depressed, Hopeless 0 0 0 0 0  PHQ - 2 Score 0 0 0 0 0  Altered sleeping - 0 - - -  Tired, decreased energy - 0 - - -  Change in appetite - 0 - - -  Feeling bad or failure about yourself  - 0 - - -  Trouble concentrating - 0 - - -  Moving slowly or fidgety/restless - 0 - - -  Suicidal thoughts - 0 - - -  PHQ-9 Score - 0 - - -  Difficult doing work/chores - Not difficult at all - - -     GEN: well developed, well nourished, no acute distress Eyes: conjunctiva and lids normal, PERRLA, EOMI ENT: TM clear, nares clear, oral exam WNL Neck: supple, no lymphadenopathy, no thyromegaly, no JVD Pulm: clear to auscultation and percussion, respiratory effort normal CV: regular rate and rhythm, S1-S2, no murmur, rub or gallop, no bruits, peripheral pulses normal and symmetric, no cyanosis, clubbing, edema or varicosities GI: soft, non-tender; no hepatosplenomegaly, masses; active bowel sounds all quadrants GU: no hernia, testicular mass,  penile discharge Lymph: no cervical, axillary or inguinal adenopathy MSK: gait normal, muscle tone and strength WNL, no joint swelling, effusions, discoloration, crepitus  SKIN: clear, good turgor, color WNL, no rashes, lesions, or ulcerations Neuro: normal mental status, normal strength, sensation, and motion Psych: alert; oriented to person, place and time, normally interactive and not anxious or depressed in appearance.  All labs reviewed with patient. Results for orders placed or performed in visit on 10/18/18  Hemoglobin A1c  Result Value Ref Range   Hgb A1c MFr Bld 6.1 4.6 - 6.5 %    Assessment and Plan:     ICD-10-CM   1. Healthcare maintenance  Z00.00    Weight loss  Flomax for increased urination.  If this doesn't work then overactive bladder medication  Health Maintenance Exam: The patient's preventative maintenance and recommended screening tests for an annual wellness exam were reviewed in full today. Brought up to date unless services declined.  Counselled on the importance of diet, exercise, and its role in overall health and mortality. The patient's FH and SH was reviewed, including their home life, tobacco status, and drug and alcohol status.  Follow-up in 1 year for physical exam or additional follow-up below.  I have personally reviewed the Medicare Annual Wellness questionnaire and have noted 1. The patient's medical and social history 2. Their use of alcohol, tobacco or illicit drugs 3. Their current medications and supplements 4. The patient's functional ability including ADL's, fall risks, home safety risks and hearing or visual             impairment. 5. Diet and physical activities 6. Evidence for depression or mood disorders 7. Reviewed Updated provider list, see scanned forms and [facility] Snapshot.  8. Reviewed whether or not the patient has HCPOA or living will, and discussed what this means with the patient.  Recommended he bring in a copy for his chart  in [facility].  The patients weight, height, BMI and visual acuity have been recorded in the chart I have made referrals, counseling and provided education to the patient based review of the above and I have provided the pt with a written personalized care plan for preventive  services.  I have provided the patient with a copy of your personalized plan for preventive services. Instructed to take the time to review along with their updated medication list.  Follow-up: No follow-ups on file. Or follow-up in 1 year if not noted.  No future appointments.  No orders of the defined types were placed in this encounter.  There are no discontinued medications. No orders of the defined types were placed in this encounter.   Signed,  Maud Deed. Jannelly Bergren, MD   Allergies as of 10/23/2018      Reactions   Oxycodone-aspirin Nausea And Vomiting      Medication List       Accurate as of October 23, 2018 10:01 AM. If you have any questions, ask your nurse or doctor.        apixaban 5 MG Tabs tablet Commonly known as: Eliquis Take 1 tablet (5 mg total) by mouth 2 (two) times daily.   atorvastatin 40 MG tablet Commonly known as: LIPITOR TAKE 1 TABLET(40 MG) BY MOUTH DAILY   metoprolol succinate 25 MG 24 hr tablet Commonly known as: Toprol XL Take 0.5 tablets (12.5 mg total) by mouth daily.   nitroGLYCERIN 0.4 MG SL tablet Commonly known as: NITROSTAT Place 1 tablet (0.4 mg total) under the tongue every 5 (five) minutes as needed.   PreserVision AREDS Tabs Take by mouth. Take 1 tablet by mouth twice daily

## 2018-10-23 ENCOUNTER — Other Ambulatory Visit: Payer: Self-pay

## 2018-10-23 ENCOUNTER — Encounter: Payer: Self-pay | Admitting: Family Medicine

## 2018-10-23 ENCOUNTER — Ambulatory Visit (INDEPENDENT_AMBULATORY_CARE_PROVIDER_SITE_OTHER): Payer: Medicare Other | Admitting: Family Medicine

## 2018-10-23 VITALS — BP 118/78 | HR 84 | Temp 97.8°F | Ht 66.5 in | Wt 252.2 lb

## 2018-10-23 DIAGNOSIS — Z0000 Encounter for general adult medical examination without abnormal findings: Secondary | ICD-10-CM

## 2018-10-23 MED ORDER — TAMSULOSIN HCL 0.4 MG PO CAPS: 0.4000 mg | ORAL_CAPSULE | Freq: Every day | ORAL | 3 refills | Status: DC

## 2018-10-29 ENCOUNTER — Telehealth: Payer: Self-pay | Admitting: Family Medicine

## 2018-10-29 DIAGNOSIS — N3281 Overactive bladder: Secondary | ICD-10-CM

## 2018-10-29 NOTE — Telephone Encounter (Signed)
Needs to give it more time. If response is inadequate after 2 to 4 weeks, may increase to 0.8 mg once daily.

## 2018-10-29 NOTE — Telephone Encounter (Signed)
Patient called today and stated that on July 29th he was prescribed a medication for an over active bladder. He stated that he has been keeping track of this medication since he started taking it on the 29th and he does not believe it is working.  Patient stated he gets up to go to the bathroom at night at least 6 times or more every night .  Patient really did not want to wait until tomorrow for Dr Lorelei Pont to return to have this addressed.   Is this something you could look into and give the patient some advice ?    Patient's C/B # [phone]

## 2018-10-29 NOTE — Telephone Encounter (Signed)
Mr. Shrum notified as instructed by telephone.  Patient states understanding.  He did ask what was a good time to take this medication.  Per Dr. Diona Browner, patient was instructed to take it in the evening.

## 2018-11-11 NOTE — Telephone Encounter (Signed)
Patient called and said he continued the medication and it hasn't helped. Patient would like it increased. Patient takes the pill before he goes to bed and he goes to the bathroom 6-7 times before he gets up. Patient uses [facility]. Patient said he has plenty of medication.

## 2018-11-12 NOTE — Addendum Note (Signed)
Addended by: Carter Kitten on: 11/12/2018 09:25 AM   Modules accepted: Orders

## 2018-11-12 NOTE — Telephone Encounter (Signed)
He can increase it to 2 tablets, but if that does not work in a week, then we should try something else

## 2018-11-12 NOTE — Telephone Encounter (Signed)
Hunter Young notified as instructed by telephone.  Patient states understanding.  Medication list updated.

## 2018-11-19 NOTE — Telephone Encounter (Signed)
Thank you :)

## 2018-11-19 NOTE — Telephone Encounter (Signed)
I have entered referral.  Charmaine will call Mr. Riopel back today.

## 2018-11-19 NOTE — Telephone Encounter (Signed)
Best number [phone]  Pt called tamsulosin is not helping with his bladder  [facility] [place]  Pt would like to know what he needs to do now

## 2018-11-19 NOTE — Telephone Encounter (Signed)
Hunter Young notified as instructed by telephone.  He is agreeable to referral and prefers to go to [place].  He is going out of town and would like to be contacted today if possible about the appointment.

## 2018-11-19 NOTE — Telephone Encounter (Signed)
Patient called back and stated that he is needing to talk to someone today about scheduling appt.  He needs this taken care of as soon as possbile

## 2018-11-19 NOTE — Telephone Encounter (Signed)
Urological consulation recommended - let me know if he would prefer Lantana or gso

## 2018-11-19 NOTE — Addendum Note (Signed)
Addended by: Carter Kitten on: 11/19/2018 03:30 PM   Modules accepted: Orders

## 2018-11-22 ENCOUNTER — Other Ambulatory Visit: Payer: Self-pay

## 2018-11-22 ENCOUNTER — Encounter: Payer: Self-pay | Admitting: Urology

## 2018-11-22 ENCOUNTER — Other Ambulatory Visit
Admission: RE | Admit: 2018-11-22 | Discharge: 2018-11-22 | Disposition: A | Discharge status: Home / Self Care | Payer: Medicare Other | Attending: Urology | Admitting: Urology

## 2018-11-22 ENCOUNTER — Other Ambulatory Visit: Payer: Self-pay | Admitting: Cardiovascular Disease

## 2018-11-22 ENCOUNTER — Ambulatory Visit: Payer: Medicare Other | Admitting: Urology

## 2018-11-22 VITALS — BP 110/71 | HR 78

## 2018-11-22 DIAGNOSIS — N401 Enlarged prostate with lower urinary tract symptoms: Secondary | ICD-10-CM

## 2018-11-22 DIAGNOSIS — R3912 Poor urinary stream: Secondary | ICD-10-CM | POA: Diagnosis not present

## 2018-11-22 DIAGNOSIS — N3281 Overactive bladder: Secondary | ICD-10-CM | POA: Insufficient documentation

## 2018-11-22 DIAGNOSIS — R0683 Snoring: Secondary | ICD-10-CM | POA: Diagnosis not present

## 2018-11-22 DIAGNOSIS — R351 Nocturia: Secondary | ICD-10-CM

## 2018-11-22 LAB — URINALYSIS, COMPLETE (UACMP) WITH MICROSCOPIC
Bacteria, UA: NONE SEEN
Bilirubin Urine: NEGATIVE
Glucose, UA: NEGATIVE mg/dL
Hgb urine dipstick: NEGATIVE
Ketones, ur: NEGATIVE mg/dL
Leukocytes,Ua: NEGATIVE
Nitrite: NEGATIVE
Protein, ur: NEGATIVE mg/dL
RBC / HPF: NONE SEEN RBC/hpf (ref 0–5)
Specific Gravity, Urine: 1.015 (ref 1.005–1.030)
Squamous Epithelial / HPF: NONE SEEN (ref 0–5)
pH: 5.5 (ref 5.0–8.0)

## 2018-11-22 LAB — BLADDER SCAN AMB NON-IMAGING

## 2018-11-22 MED ORDER — FINASTERIDE 5 MG PO TABS: 5.0000 mg | ORAL_TABLET | Freq: Every day | ORAL | 8 refills | Status: DC

## 2018-11-22 NOTE — Telephone Encounter (Signed)
Please review for refill. Thank you! 

## 2018-11-22 NOTE — Progress Notes (Signed)
11/22/2018 10:21 AM   Hunter Young 02-18-1941 [id number]  Referring provider: Owens Loffler, MD [address] [place],  [place] [address]  Chief Complaint  Patient presents with  . Over Active Bladder    New patient    HPI: 78 year old male who presents today for further evaluation of urinary symptoms.  His primary complaint today is nocturia.  This is been going on for years but is worsened over the past year.  He reports that he sleeps about 4 hours and then he is up every 30 minutes or so, 7 or 8 times sometimes for the rest of the night.  This is very disruptive.  He has trouble getting back to sleep.  He also reports that he sometimes has trouble starting a stream and seems weaker over the previous several years.  He occasionally has daytime urgency and frequency.  He denies urge incontinence other than on a rare occasion when he is washing his hands with warm water.  No dysuria or gross hematuria.  No UTIs.  UA today is unremarkable.  He does snore.  He wakes up fatigued.  He is never had a sleep study.  In terms of behavior, he drinks mostly water throughout the day.  He starts having a cocktail in the evenings at 5 PM up until 6:30 PM.  He reports that he drinks 2 ounces of bourbon with 2 ounces of water x3.  He then has dinner around 630 and drinks another Coke or tea with dinner.  He goes to bed around 8:30 PM.  He denies any lower extremity edema.  He is not on any diuretics.  He is currently on Flomax.  He is started on this medication a few months ago when he was having urinary symptoms.  This is most recently doubled.  He reports that this made no difference in his urinary symptoms whatsoever.  He also reports having dizziness from this.  His most recent PSA was 0.37 on 01/2014.  He declined rectal exam today.   IPSS    Row Name 11/22/18 0900         International Prostate Symptom Score   How often have you had the sensation of not emptying your  bladder?  Not at All     How often have you had to urinate less than every two hours?  About half the time     How often have you found you stopped and started again several times when you urinated?  Not at All     How often have you found it difficult to postpone urination?  Not at All     How often have you had a weak urinary stream?  Almost always     How often have you had to strain to start urination?  Not at All     How many times did you typically get up at night to urinate?  5 Times     Total IPSS Score  13       Quality of Life due to urinary symptoms   If you were to spend the rest of your life with your urinary condition just the way it is now how would you feel about that?  Unhappy        Score:  1-7 Mild 8-19 Moderate 20-35 Severe    PMH: Past Medical History:  Diagnosis Date  . ALLERGIC RHINITIS 04/22/2007  . ASTHMA, INTRINSIC NOS 12/27/2006  . BENIGN PROSTATIC HYPERTROPHY, WITH URINARY OBSTRUCTION  06/25/2007  . Cardiomegaly 05/30/2007  . Carotid stenosis 01/29/2014  . CERVICAL DISORDER, NOS 10/14/2009  . CORONARY ARTERY DISEASE 12/03/2006  . DIVERTICULOSIS OF COLON 05/20/2001  . Dyshidrosis 06/25/2007  . GERD 12/06/2007  . HYPERLIPIDEMIA 12/03/2006  . HYPERTENSION 12/03/2006  . INTERNAL HEMORRHOIDS 05/20/2001  . OBESITY, MODERATE 12/05/2008  . OSTEOARTHRITIS 06/25/2007  . VENOUS INSUFFICIENCY 12/04/2006    Surgical History: Past Surgical History:  Procedure Laterality Date  . ANGIOPLASTY     2 stents  . FOOT SURGERY    . JOINT REPLACEMENT     bilat knee  . TONSILLECTOMY      Home Medications:  Allergies as of 11/22/2018      Reactions   Oxycodone-aspirin Nausea And Vomiting      Medication List       Accurate as of November 22, 2018 10:21 AM. If you have any questions, ask your nurse or doctor.        apixaban 5 MG Tabs tablet Commonly known as: Eliquis Take 1 tablet (5 mg total) by mouth 2 (two) times daily.   atorvastatin 40 MG tablet Commonly known as:  LIPITOR TAKE 1 TABLET(40 MG) BY MOUTH DAILY   metoprolol succinate 25 MG 24 hr tablet Commonly known as: Toprol XL Take 0.5 tablets (12.5 mg total) by mouth daily.   nitroGLYCERIN 0.4 MG SL tablet Commonly known as: NITROSTAT Place 1 tablet (0.4 mg total) under the tongue every 5 (five) minutes as needed.   PreserVision AREDS Tabs Take by mouth. Take 1 tablet by mouth twice daily   tamsulosin 0.4 MG Caps capsule Commonly known as: FLOMAX Take 0.8 mg by mouth.       Allergies:  Allergies  Allergen Reactions  . Oxycodone-Aspirin Nausea And Vomiting    Family History: Family History  Problem Relation Age of Onset  . Colon cancer Mother 11    Social History:  reports that he quit smoking about 38 years ago. He has never used smokeless tobacco. He reports current alcohol use of about 21.0 standard drinks of alcohol per week. He reports that he does not use drugs.  ROS: UROLOGY Frequent Urination?: Yes Hard to postpone urination?: Yes Burning/pain with urination?: No Get up at night to urinate?: Yes Leakage of urine?: Yes Urine stream starts and stops?: No Trouble starting stream?: No Do you have to strain to urinate?: No Blood in urine?: No Urinary tract infection?: No Sexually transmitted disease?: No Injury to kidneys or bladder?: No Painful intercourse?: No Weak stream?: Yes Erection problems?: No Penile pain?: No  Gastrointestinal Nausea?: No Vomiting?: No Indigestion/heartburn?: No Diarrhea?: No Constipation?: No  Constitutional Fever: No Night sweats?: No Weight loss?: No Fatigue?: No  Skin Skin rash/lesions?: No Itching?: No  Eyes Blurred vision?: No Double vision?: No  Ears/Nose/Throat Sore throat?: No Sinus problems?: No  Hematologic/Lymphatic Swollen glands?: No Easy bruising?: No  Cardiovascular Leg swelling?: No Chest pain?: No  Respiratory Cough?: No Shortness of breath?: No  Endocrine Excessive thirst?: No   Musculoskeletal Back pain?: No Joint pain?: No  Neurological Headaches?: No Dizziness?: No  Psychologic Depression?: No Anxiety?: No  Physical Exam: Blood pressure 110/71, pulse 78. Constitutional:  Alert and oriented, No acute distress. HEENT: [place] AT, moist mucus membranes.  Trachea midline, no masses. Cardiovascular: No clubbing, cyanosis, or edema. Respiratory: Normal respiratory effort, no increased work of breathing. GI: Abdomen is soft, nontender, nondistended, no abdominal masses Skin: No rashes, bruises or suspicious lesions. Neurologic: Grossly intact, no focal deficits, moving  all 4 extremities. Psychiatric: Normal mood and affect.  Laboratory Data: Lab Results  Component Value Date   WBC 6.9 10/18/2018   HGB 15.1 10/18/2018   HCT 46.8 10/18/2018   MCV 90.5 10/18/2018   PLT 176.0 10/18/2018    Lab Results  Component Value Date   CREATININE 1.03 10/18/2018    Lab Results  Component Value Date   PSA 0.39 07/31/2017   PSA 0.33 07/26/2016   PSA 0.37 01/26/2014    Lab Results  Component Value Date   HGBA1C 6.1 10/18/2018    Urinalysis Component     Latest Ref Rng & Units 11/22/2018  Color, Urine     YELLOW YELLOW  Appearance     CLEAR CLEAR  Specific Gravity, Urine     1.005 - 1.030 1.015  pH     5.0 - 8.0 5.5  Glucose, UA     NEGATIVE mg/dL NEGATIVE  Hgb urine dipstick     NEGATIVE NEGATIVE  Bilirubin Urine     NEGATIVE NEGATIVE  Ketones, ur     NEGATIVE mg/dL NEGATIVE  Protein     NEGATIVE mg/dL NEGATIVE  Nitrite     NEGATIVE NEGATIVE  Leukocytes,Ua     NEGATIVE NEGATIVE  Squamous Epithelial / LPF     0 - 5 NONE SEEN  WBC, UA     0 - 5 WBC/hpf 0-5  RBC / HPF     0 - 5 RBC/hpf NONE SEEN  Bacteria, UA     NONE SEEN NONE SEEN    Pertinent Imaging: Results for orders placed or performed in visit on 11/22/18  BLADDER SCAN AMB NON-IMAGING  Result Value Ref Range   Scan Result 38ml     Assessment & Plan:    1. Benign  prostatic hyperplasia with nocturia Worsening primarily nighttime symptoms but also has obstructive daytime symptoms  We discussed today that her symptoms are likely multifactorial related to behavior (drinking close to bedtime), possible underlying BPH, possible undiagnosed untreated sleep apnea, amongst others.  Strongly recommend abstaining from beverages 4 hours prior to bed which may help somewhat.  In addition to the above, based on his habitus and snoring, I am strongly suspicious that he has undiagnosed untreated sleep apnea.  I have recommended that he follow-up with Dr. Edilia Bo for referral for sleep study.  I will send him a note in regard to this.  We will also treat for presumed prostatomegaly (rectal exam declined today) and start finasteride.  He had no benefit from Flomax and it does make him dizzy this of abdomen stop this medication.  Understands that the onset of this medications relatively slow, up to 6 months until you reach her maximal effect.  Adequate bladder emptying.  No evidence of infection is contributing factor. - Urinalysis, Complete w Microscopic; Future - BLADDER SCAN AMB NON-IMAGING  2. Weak urinary stream As above, adequate emptying  3. Snoring Recommend sleep study   Return in about 6 months (around 05/25/2019) for IPSS/ PVR.  Hollice Espy, MD  [facility] [address], [place], [place] [address] [phone]

## 2018-11-22 NOTE — Telephone Encounter (Signed)
Eliquis 5mg  refill request received, pt is 78yrs old, weight-114.4kg, Crea-1.03 on 10/18/2018, Diagnosis-Afib, and last seen by [facility] on 10/15/2018. Will send in refill to requested pharmacy.

## 2018-11-27 ENCOUNTER — Other Ambulatory Visit: Payer: Self-pay | Admitting: Family Medicine

## 2018-11-27 DIAGNOSIS — G4733 Obstructive sleep apnea (adult) (pediatric): Secondary | ICD-10-CM

## 2018-11-27 NOTE — Progress Notes (Signed)
Done per urology recs

## 2018-12-14 ENCOUNTER — Other Ambulatory Visit: Payer: Self-pay | Admitting: Family Medicine

## 2018-12-17 ENCOUNTER — Other Ambulatory Visit: Payer: Self-pay | Admitting: Cardiovascular Disease

## 2018-12-17 ENCOUNTER — Telehealth: Payer: Self-pay

## 2018-12-17 NOTE — Telephone Encounter (Signed)
Pt said he went to [facility] in [place] today and pt picked up atorvastatin 40 mg; pt said had 5 things in bag and did not notice until got home he only got # 12 of Atorvastatin 40 mg. I called [facility] in [place] and spoke with Clair Gulling a pharmacist and Clair Gulling could not explain why pt only got # 12 of atorvastatin. Clair Gulling said to have pt come back to [facility] in [place] at 5 PM and he would have fixed the problem for the pt. Pt voiced understanding was appreciative.

## 2018-12-19 ENCOUNTER — Other Ambulatory Visit: Payer: Self-pay | Admitting: *Deleted

## 2018-12-19 MED ORDER — METOPROLOL SUCCINATE ER 25 MG PO TB24: 25.0000 mg | ORAL_TABLET | Freq: Every day | ORAL | 3 refills | Status: DC

## 2018-12-26 DIAGNOSIS — H43811 Vitreous degeneration, right eye: Secondary | ICD-10-CM | POA: Diagnosis not present

## 2018-12-26 DIAGNOSIS — Z961 Presence of intraocular lens: Secondary | ICD-10-CM | POA: Diagnosis not present

## 2018-12-26 DIAGNOSIS — H43392 Other vitreous opacities, left eye: Secondary | ICD-10-CM | POA: Diagnosis not present

## 2018-12-26 DIAGNOSIS — H35342 Macular cyst, hole, or pseudohole, left eye: Secondary | ICD-10-CM | POA: Diagnosis not present

## 2018-12-26 DIAGNOSIS — H353221 Exudative age-related macular degeneration, left eye, with active choroidal neovascularization: Secondary | ICD-10-CM | POA: Diagnosis not present

## 2018-12-30 ENCOUNTER — Telehealth: Payer: Self-pay | Admitting: Cardiovascular Disease

## 2018-12-30 NOTE — Telephone Encounter (Signed)
Please call to discuss Eliquis. States his cost has increased for this medication. He would like to know if there is an alternative.

## 2018-12-30 NOTE — Telephone Encounter (Signed)
I spoke to the patient who is concerned because his Eliquis has increased from $47/month to $125.76/month.  He is inquiring if he could change to something different or only take 1 per day, or seek other options for payment.  Please advise, thank you

## 2018-12-31 NOTE — Telephone Encounter (Signed)
Attempted to call the patient. No answer- I left a message to please call back.  

## 2018-12-31 NOTE — Telephone Encounter (Signed)
Sounds like he has fallen into the donut hole Eliquis is only a twice a day, not a once a day medication He can call his insurance and see if xarelto might be cheaper If not a candidate for patient assistance, may need to change to warfarin

## 2018-12-31 NOTE — Telephone Encounter (Signed)
I spoke with the patient and advised him of Dr. Donivan Scull recommendations. He states he will check with his insurance on the cost of Xarelto.  I have advised him not to be surprised if there is not much difference in cost. He will follow back up with [place] after calling his insurance.   He is also aware that warfarin is an option that is much less expensive, but also requires blood draws and watching his diet.

## 2019-05-29 NOTE — Progress Notes (Signed)
05/30/2019 9:16 AM   Hunter Young 06/10/1940 [id number]  Referring provider: Owens Loffler, MD [address] [place],   [address]  Chief Complaint  Patient presents with  . Benign Prostatic Hypertrophy    56mo follow up    HPI: Hunter Young is a 79 yo white M with a hx of obstructive symptoms including nocturia returns today for a 6 month f/u for further evaluation.   He was started on finasteride for presumed BPH with no estimated prostate size over the last 6 months. He stopped taking it a week ago and made no difference. He reports that it did not help with his stream and experiences nocturia 8-9x.   His voiding diary shows that he goes to bathroom 9-11 times over a 24 hour period. Most of the time is in the early morning hours.   Pt drinks 1 cup of coffee per day and does not drink tea. He quit drinking 4 hours prior to bed and drinks water mostly throughout the day along with diet coke (2-3). He also drinks 2 alcoholic beverages a day.   Referred him to sleep study last visit but pt did not know f/u with plan.  His most recent PSA from 07/2017 was 0.39. Previous PSA 0.37 from 01/2014. PVR, 0 mL.   IPSS    Row Name 05/30/19 0900         International Prostate Symptom Score   How often have you had the sensation of not emptying your bladder?  Less than half the time     How often have you had to urinate less than every two hours?  Almost always     How often have you found you stopped and started again several times when you urinated?  Less than 1 in 5 times     How often have you found it difficult to postpone urination?  Not at All     How often have you had a weak urinary stream?  Almost always     How often have you had to strain to start urination?  Not at All     How many times did you typically get up at night to urinate?  5 Times     Total IPSS Score  18       Quality of Life due to urinary symptoms   If you were to spend the rest of your life with  your urinary condition just the way it is now how would you feel about that?  Unhappy        Score:  1-7 Mild 8-19 Moderate 20-35 Severe  PMH: Past Medical History:  Diagnosis Date  . ALLERGIC RHINITIS 04/22/2007  . ASTHMA, INTRINSIC NOS 12/27/2006  . BENIGN PROSTATIC HYPERTROPHY, WITH URINARY OBSTRUCTION 06/25/2007  . Cardiomegaly 05/30/2007  . Carotid stenosis 01/29/2014  . CERVICAL DISORDER, NOS 10/14/2009  . CORONARY ARTERY DISEASE 12/03/2006  . DIVERTICULOSIS OF COLON 05/20/2001  . Dyshidrosis 06/25/2007  . GERD 12/06/2007  . HYPERLIPIDEMIA 12/03/2006  . HYPERTENSION 12/03/2006  . INTERNAL HEMORRHOIDS 05/20/2001  . OBESITY, MODERATE 12/05/2008  . OSTEOARTHRITIS 06/25/2007  . VENOUS INSUFFICIENCY 12/04/2006    Surgical History: Past Surgical History:  Procedure Laterality Date  . ANGIOPLASTY     2 stents  . FOOT SURGERY    . JOINT REPLACEMENT     bilat knee  . TONSILLECTOMY      Home Medications:  Allergies as of 05/30/2019      Reactions  Oxycodone-aspirin Nausea And Vomiting      Medication List       Accurate as of May 30, 2019  9:16 AM. If you have any questions, ask your nurse or doctor.        atorvastatin 40 MG tablet Commonly known as: LIPITOR TAKE 1 TABLET(40 MG) BY MOUTH DAILY   Eliquis 5 MG Tabs tablet Generic drug: apixaban TAKE 1 TABLET(5 MG) BY MOUTH TWICE DAILY   finasteride 5 MG tablet Commonly known as: PROSCAR Take 1 tablet (5 mg total) by mouth daily.   metoprolol succinate 25 MG 24 hr tablet Commonly known as: Toprol XL Take 1 tablet (25 mg total) by mouth daily.   nitroGLYCERIN 0.4 MG SL tablet Commonly known as: NITROSTAT Place 1 tablet (0.4 mg total) under the tongue every 5 (five) minutes as needed.   PreserVision AREDS Tabs Take by mouth. Take 1 tablet by mouth twice daily       Allergies:  Allergies  Allergen Reactions  . Oxycodone-Aspirin Nausea And Vomiting    Family History: Family History  Problem Relation Age of  Onset  . Colon cancer Mother 16    Social History:  reports that he quit smoking about 38 years ago. He has never used smokeless tobacco. He reports current alcohol use of about 21.0 standard drinks of alcohol per week. He reports that he does not use drugs.   Physical Exam: BP (!) 142/71   Pulse 90   Ht 5\' 8"  (1.727 m)   Wt 255 lb (115.7 kg)   BMI 38.77 kg/m   Constitutional:  Alert and oriented, No acute distress. HEENT: [place] AT, moist mucus membranes.  Trachea midline, no masses. Cardiovascular: No clubbing, cyanosis, or edema. Respiratory: Normal respiratory effort, no increased work of breathing. Skin: No rashes, bruises or suspicious lesions. Neurologic: Grossly intact, no focal deficits, moving all 4 extremities. Psychiatric: Normal mood and affect.  Laboratory Data:   Pertinent Imaging: Results for orders placed or performed in visit on 05/30/19  BLADDER SCAN AMB NON-IMAGING  Result Value Ref Range   Scan Result 48ml    Assessment & Plan:    1. BPH with nocturia  Adequate emptying of bladder, PVR 0 mL   Started on finasteride with no estimated prostate size over the last 6 months Medication did not make difference, nocturia 8-9x; okay to discontinue  Continues to Snore and disruptive sleep; strongly feel that there is a component of undiagnosed untreated sleep apnea contributing to his nighttime symptoms especially.  We will send another message to his primary care physician, unclear why he never had the sleep study done.  Patient does not seem to recall that we ever discussed this nor does he ever mention that it was addressed by his primary care despite documentation.  Educated pt on behavior modification including diet and not drinking diet coke  Recommended Myrbetriq to help with frequency; given Myrbetriq 50 mg daily samples  Call back regarding medication response otherwise see Carlis Stable for f/u  2. Snoring Recommended pt last time to undergo sleep study  but pt did not f/u with plan  Educated pt about sleep study and possibility of sleep apnea; strongly recommended pt to do a sleep study  Patient advised to call after trial of Myrbetriq, we will devise follow-up plan based on how he responds this medication  [name] [place], [place] [address] [phone]  I, Lucas Mallow, am acting as a Education administrator for  Dr. Hollice Espy,  I have reviewed the above documentation for accuracy and completeness, and I agree with the above.   Hollice Espy, MD

## 2019-05-30 ENCOUNTER — Ambulatory Visit (INDEPENDENT_AMBULATORY_CARE_PROVIDER_SITE_OTHER): Payer: Medicare Other | Admitting: Urology

## 2019-05-30 ENCOUNTER — Encounter: Payer: Self-pay | Admitting: Urology

## 2019-05-30 ENCOUNTER — Other Ambulatory Visit: Payer: Self-pay

## 2019-05-30 VITALS — BP 142/71 | HR 90 | Ht 68.0 in | Wt 255.0 lb

## 2019-05-30 DIAGNOSIS — R351 Nocturia: Secondary | ICD-10-CM | POA: Diagnosis not present

## 2019-05-30 DIAGNOSIS — N401 Enlarged prostate with lower urinary tract symptoms: Secondary | ICD-10-CM

## 2019-05-30 LAB — BLADDER SCAN AMB NON-IMAGING

## 2019-06-05 ENCOUNTER — Telehealth: Payer: Self-pay | Admitting: Family Medicine

## 2019-06-05 NOTE — Chronic Care Management (AMB) (Signed)
  Chronic Care Management   Note  06/05/2019 Name: HARALD MIAO MRN: [id number] DOB: [birth year]  NEWEL CLEERE is a 79 y.o. year old male who is a primary care patient of Copland, Frederico Hamman, MD. I reached out to [name] by phone today in response to a referral sent by Mr. Cherie Khera Cazeau's PCP, Owens Loffler, MD.   Mr. Pichette was given information about Chronic Care Management services today including:  1. CCM service includes personalized support from designated clinical staff supervised by his physician, including individualized plan of care and coordination with other care providers 2. 24/7 contact phone numbers for assistance for urgent and routine care needs. 3. Service will only be billed when office clinical staff spend 20 minutes or more in a month to coordinate care. 4. Only one practitioner may furnish and bill the service in a calendar month. 5. The patient may stop CCM services at any time (effective at the end of the month) by phone call to the office staff.   Patient agreed to services and verbal consent obtained.   Follow up plan:   Raynicia Dukes [employer] Scheduler

## 2019-06-11 DIAGNOSIS — H903 Sensorineural hearing loss, bilateral: Secondary | ICD-10-CM | POA: Diagnosis not present

## 2019-06-23 ENCOUNTER — Other Ambulatory Visit: Payer: Self-pay

## 2019-06-23 ENCOUNTER — Ambulatory Visit: Payer: Medicare Other

## 2019-06-23 ENCOUNTER — Telehealth: Payer: Self-pay

## 2019-06-23 ENCOUNTER — Telehealth: Payer: Self-pay | Admitting: Urology

## 2019-06-23 DIAGNOSIS — I10 Essential (primary) hypertension: Secondary | ICD-10-CM

## 2019-06-23 DIAGNOSIS — E785 Hyperlipidemia, unspecified: Secondary | ICD-10-CM

## 2019-06-23 DIAGNOSIS — I2510 Atherosclerotic heart disease of native coronary artery without angina pectoris: Secondary | ICD-10-CM

## 2019-06-23 DIAGNOSIS — H905 Unspecified sensorineural hearing loss: Secondary | ICD-10-CM | POA: Diagnosis not present

## 2019-06-23 MED ORDER — MIRABEGRON ER 50 MG PO TB24: 50.0000 mg | ORAL_TABLET | Freq: Every day | ORAL | 11 refills | Status: DC

## 2019-06-23 NOTE — Telephone Encounter (Signed)
Prescription sent in to [facility] as requested. Patient aware.

## 2019-06-23 NOTE — Telephone Encounter (Signed)
Please sign referral

## 2019-06-23 NOTE — Telephone Encounter (Signed)
I would like to request a referral for RAFFERTY ELSBERRY to chronic care management pharmacy services for the following conditions:   Essential hypertension, benign  [I10]  Coronary artery disease involving native coronary artery without angina pectoris [I25.10]  [name], PharmD Clinical Pharmacist [facility] [phone]

## 2019-06-23 NOTE — Telephone Encounter (Signed)
Patient called the office today about his samples of 50mg  Myrbetriq.  He only has 2 pills left and is going out of town tomorrow.    I spoke to Judson Roch in [place] and she is going to have more samples at the front desk for him to pick up.  Patient reports that he has seem some improvement in his symptoms over the last 4-5 days.  He would like to continue to try the medication.  Please send a prescription to the [facility] in [place] for him to pick up when he gets back in town.

## 2019-06-23 NOTE — Chronic Care Management (AMB) (Signed)
Chronic Care Management Pharmacy  Name: WAYMOND STOLTENBERG  MRN: [id number] DOB: [birth year]  Chief Complaint/ HPI  [name],  79 y.o., male presents for their Initial CCM visit with the clinical pharmacist via telephone.  PCP : Owens Loffler, MD  Their chronic conditions include: hypertension, hyperlipidemia, CAD, allergic rhinitis, GERD, osteoarthritis, BPH, AFIB  Patient concerns: frequent urination is still a problem (referred to urologist), picked up more samples from urology today, plans to pick up Myrbetriq from [facility] once samples run out  Office Visits:  10/23/18: [facility] - weight loss, Flomax for increased urination  Consult Visit:  05/30/19: BPH - tried finasteride for 6 months, no difference in symptoms of weak stream, nocturia 8-9x, recommend sleep study, start Myrbetriq 50 mg samples - 1 daily   11/22/18: BPH - start finasteride   10/15/18: Cardiology - permanent AFIB/CAD, continue current meds  Allergies  Allergen Reactions  . Oxycodone-Aspirin Nausea And Vomiting   Medications: Outpatient Encounter Medications as of 06/23/2019  Medication Sig  . atorvastatin (LIPITOR) 40 MG tablet TAKE 1 TABLET(40 MG) BY MOUTH DAILY  . ELIQUIS 5 MG TABS tablet TAKE 1 TABLET(5 MG) BY MOUTH TWICE DAILY  . metoprolol succinate (TOPROL XL) 25 MG 24 hr tablet Take 1 tablet (25 mg total) by mouth daily.  . Multiple Vitamins-Minerals (PRESERVISION AREDS) TABS Take by mouth. Take 1 tablet by mouth twice daily  . nitroGLYCERIN (NITROSTAT) 0.4 MG SL tablet Place 1 tablet (0.4 mg total) under the tongue every 5 (five) minutes as needed.   No facility-administered encounter medications on file as of 06/23/2019.   Current Diagnosis/Assessment: Goals    . DIET - EAT MORE FRUITS AND VEGETABLES     Starting 07/31/2017, I will continue to eat 4-5 servings of fresh fruits and vegetables.     . Eat more fruits and vegetables     Starting 07/26/2016, I will continue to eat 3-4 servings of fresh  fruits and vegetables.     . Pharmacy Care Plan     CARE PLAN ENTRY  Current Barriers:  . Chronic Disease Management support, education, and care coordination needs related to hypertension, hyperlipidemia, CAD, allergic rhinitis, GERD, osteoarthritis, BPH, AFIB  Pharmacist Clinical Goal(s):  . Reduce urinary frequency. Recommend continuing to limit coffee, soda, and alcohol intake. Please contact our office or urologist if symptoms not improved over the next 4 weeks.  . Remain up to date on vaccinations. Recommend tetanus (Tdap) and shingles 2-dose vaccine series (Shingrix) from local pharmacy.  . Reduce cost burden of Xarelto. Will mail patient assistance forms. . Improve cholesterol with dietary changes. See attached handout on cholesterol content in foods. Adhere to heart healthy diet.  . Maintain blood pressure within goal of less than 140/90 mmHg. Recommend checking blood pressure in the morning before breakfast and coffee. Please call if any readings above 140/90 mmHg.   Interventions: . Comprehensive medication review performed.  Patient Self Care Activities:  . Self administers medications as prescribed  Initial goal documentation      Hypertension   CMP Latest Ref Rng & Units 10/18/2018 04/03/2018 10/10/2017  Glucose 70 - 99 mg/dL 145(H) 87 89  BUN 6 - 23 mg/dL 23 18 17   Creatinine 0.40 - 1.50 mg/dL 1.03 0.96 1.01  Sodium 135 - 145 mEq/L 140 140 138  Potassium 3.5 - 5.1 mEq/L 4.1 4.5 4.1  Chloride 96 - 112 mEq/L 105 105 106  CO2 19 - 32 mEq/L 27 29 26   Calcium 8.4 -  10.5 mg/dL 9.4 9.9 9.3  Total Protein 6.0 - 8.3 g/dL 6.2 6.6 -  Total Bilirubin 0.2 - 1.2 mg/dL 0.4 0.5 -  Alkaline Phos 39 - 117 U/L 76 63 -  AST 0 - 37 U/L 27 24 -  ALT 0 - 53 U/L 39 36 -   Office blood pressures are: BP Readings from Last 3 Encounters:  05/30/19 (!) 142/71  11/22/18 110/71  10/23/18 118/78   BP goal < 140/90 mmHg Patient has failed these meds in the past: none  Patient checks BP  at home infrequently Patient home BP readings are ranging: none reported   Patient is currently controlled on the following medications:   No pharmacotherapy  Metoprolol succinate 25 mg - 1 tablet daily (not taking - pt unsure when it was discontinued)  We discussed: has arm cuff monitor, recommend check before medications; urology referred for sleep study, need to follow up on this  Plan: Continue current medications; Recommend checking blood pressure before medications a few days this week to assess.    Hyperlipidemia/CAD   Lipid Panel     Component Value Date/Time   CHOL 142 10/18/2018 0801   CHOL 161 06/11/2014 0759   TRIG 83.0 10/18/2018 0801   HDL 44.30 10/18/2018 0801   HDL 68 06/11/2014 0759   CHOLHDL 3 10/18/2018 0801   VLDL 16.6 10/18/2018 0801   LDLCALC 82 10/18/2018 0801   LDLCALC 80 06/11/2014 0759   LABVLDL 13 06/11/2014 0759    The ASCVD Risk score (Goff DC Jr., et al., 2013) failed to calculate for the following reasons:   The 2013 ASCVD risk score is only valid for ages 59 to 36   The patient has a prior MI or stroke diagnosis   LDL goal < 70  Patient has failed these meds in past: none  Patient is currently uncontrolled on the following medications:   Atorvastatin 40 mg - 1 tablet daily  Nitroglycerin 0.4 mg SL - 1 tablet PRN (hasn't taken in a long time)  We discussed: would like to achieve weight goal of 220 lbs, not exercising due to leg pain/back pain; trying to cut down on potatoes and breads; limit red meat   Plan: Continue current medications; Mail handout on cholesterol content in foods.   AFIB   CBC Latest Ref Rng & Units 10/18/2018 04/03/2018 07/31/2017  WBC 4.0 - 10.5 K/uL 6.9 7.4 7.0  Hemoglobin 13.0 - 17.0 g/dL 15.1 16.0 14.5  Hematocrit 39.0 - 52.0 % 46.8 50.5 44.3  Platelets 150.0 - 400.0 K/uL 176.0 196.0 163.0   CHADSVASc: 5 (HTN +1, stroke +2, age > 55 +2)  Patient has failed these meds in past: none  Patient is currently controlled  on the following medications:   Eliquis 5 mg - 1 tablet BID (pays $50 - $100 per month, until donut hole)  Metoprolol succinate 25 mg - 1 tablet daily (not taking)  We discussed: risks versus benefits, wanted to know if he could change from BID to daily due to cost, asked about stroke risk, reviewed CHADSVASc risk factors and purpose of BID dosing, also discussed pt assistance, will mail Eliquis form and requirements   Plan: Continue current medications; Mail Eliquis patient assistance forms.   BPH   Symptoms: weak stream, urinating every 2 hours throughout day and every 1.5-2 hours at night; Keeps a record of frequency since February - 4 days ago symptoms reduced from every 1 hour to every 2 hours during day  Patient  has failed these meds in past: tamsulosin, finasteride  Patient is currently uncontrolled on the following medications:   Myrbetriq 50 mg - 1 daily (started May 30, 2019)  Fluid intake: cut down over the past 2 weeks, previously 1-2 cups coffee, 2-3 bottles of water and at 5 PM - 2-3, 3 ounce bourbons with 4 oz of water; now down to 1 cup coffee in AM, no alcohol in past 3-4 days, no sodas, 1-2 bottles of water daily  We discussed: likely correlation between evening alcohol intake and urination; pt plans to continue to drink less alcohol; may take up to 8 weeks to receive full benefit from Myrbetriq  Plan: Continue current medications; Continue to limit caffeine and alcohol intake especially after lunch.  Vaccines   Reviewed and discussed patient's vaccination history.    Immunization History  Administered Date(s) Administered  . Hepatitis B 07/22/2008  . Influenza Split 01/19/2011, 12/21/2011  . Influenza Whole 01/10/2007, 12/25/2008  . Influenza, High Dose Seasonal PF 01/10/2017  . Influenza,inj,Quad PF,6+ Mos 12/16/2012, 01/28/2014, 02/11/2015, 11/25/2015, 04/03/2018  . Pneumococcal Conjugate-13 01/28/2014  . Pneumococcal Polysaccharide-23 03/27/2004, 01/19/2011   . Td 03/27/1998, 07/22/2008   Plan: Recommended tetanus and shingles vaccine. Reports received both doses of the COVID-19 vaccine.   Medication Management  OTCs: multivitamin - 1 daily (Centrum Silver) (PreserVision Areds 1 BID), Tylenol for headaches  Pharmacy/Benefits: [facility]  Adherence: no concerns  Affordability: offered PAP for Eliquis, pt would like to discuss with Dr. Rockey Situ, send requirements   CCM Follow Up:  09/23/19 at 2:00 PM (telephone)  [name], PharmD Clinical Pharmacist [facility] at [facility] [phone]

## 2019-06-25 NOTE — Patient Instructions (Signed)
Dear Hunter Young,  It was a pleasure meeting you during our initial appointment on June 23, 2019. Below is a summary of the goals we discussed and components of chronic care management. Please contact me anytime with questions or concerns.   Visit Information  Goals Addressed            This Visit's Progress   . Pharmacy Care Plan       CARE PLAN ENTRY  Current Barriers:  . Chronic Disease Management support, education, and care coordination needs related to hypertension, hyperlipidemia, CAD, allergic rhinitis, GERD, osteoarthritis, BPH, AFIB  Pharmacist Clinical Goal(s):  . Reduce urinary frequency. Recommend continuing to limit coffee, soda, and alcohol intake. Please contact our office or urologist if symptoms not improved over the next 4 weeks.  . Remain up to date on vaccinations. Recommend tetanus (Tdap) and shingles 2-dose vaccine series (Shingrix) from local pharmacy.  . Reduce cost burden of Xarelto. Will mail patient assistance forms. . Improve cholesterol with dietary changes. See attached handout on cholesterol content in foods. Adhere to heart healthy diet.  . Maintain blood pressure within goal of less than 140/90 mmHg. Recommend checking blood pressure in the morning before breakfast and coffee. Please call if any readings above 140/90 mmHg.   Interventions: . Comprehensive medication review performed.  Patient Self Care Activities:  . Self administers medications as prescribed  Initial goal documentation       Hunter Young was given information about Chronic Care Management services today including:  1. CCM service includes personalized support from designated clinical staff supervised by his physician, including individualized plan of care and coordination with other care providers 2. 24/7 contact phone numbers for assistance for urgent and routine care needs. 3. Standard insurance, coinsurance, copays and deductibles apply for chronic care management only during  months in which we provide at least 20 minutes of these services. Most insurances cover these services at 100%, however patients may be responsible for any copay, coinsurance and/or deductible if applicable. This service may help you avoid the need for more expensive face-to-face services. 4. Only one practitioner may furnish and bill the service in a calendar month. 5. The patient may stop CCM services at any time (effective at the end of the month) by phone call to the office staff.  Patient agreed to services and verbal consent obtained.   The patient verbalized understanding of instructions provided today and agreed to receive a mailed copy of patient instruction and/or educational materials. Telephone follow up appointment with pharmacy team member scheduled for: 09/23/19 at 2:00 PM (telephone)  [name], PharmD Clinical Pharmacist [facility] at [facility] [phone]   Cholesterol Content in Foods Cholesterol is a waxy, fat-like substance that helps to carry fat in the blood. The body needs cholesterol in small amounts, but too much cholesterol can cause damage to the arteries and heart. Most people should eat less than 200 milligrams (mg) of cholesterol a day. Foods with cholesterol  Cholesterol is found in animal-based foods, such as meat, seafood, and dairy. Generally, low-fat dairy and lean meats have less cholesterol than full-fat dairy and fatty meats. The milligrams of cholesterol per serving (mg per serving) of common cholesterol-containing foods are listed below. Meat and other proteins  Egg -- one large whole egg has 186 mg.  Veal shank -- 4 oz has 141 mg.  Lean ground [place] (93% lean) -- 4 oz has 118 mg.  Fat-trimmed lamb loin -- 4 oz has 106  mg.  Lean ground beef (90% lean) -- 4 oz has 100 mg.  Lobster -- 3.5 oz has 90 mg.  Pork loin chops -- 4 oz has 86 mg.  Canned salmon -- 3.5 oz has 83 mg.  Fat-trimmed beef top loin -- 4 oz has 78  mg.  Frankfurter -- 1 frank (3.5 oz) has 77 mg.  Crab -- 3.5 oz has 71 mg.  Roasted chicken without skin, white meat -- 4 oz has 66 mg.  Light bologna -- 2 oz has 45 mg.  Deli-cut [place] -- 2 oz has 31 mg.  Canned tuna -- 3.5 oz has 31 mg.  Berniece Salines -- 1 oz has 29 mg.  Oysters and mussels (raw) -- 3.5 oz has 25 mg.  Mackerel -- 1 oz has 22 mg.  Trout -- 1 oz has 20 mg.  Pork sausage -- 1 link (1 oz) has 17 mg.  Salmon -- 1 oz has 16 mg.  Tilapia -- 1 oz has 14 mg. Dairy  Soft-serve ice cream --  cup (4 oz) has 103 mg.  Whole-milk yogurt -- 1 cup (8 oz) has 29 mg.  Cheddar cheese -- 1 oz has 28 mg.  American cheese -- 1 oz has 28 mg.  Whole milk -- 1 cup (8 oz) has 23 mg.  2% milk -- 1 cup (8 oz) has 18 mg.  Cream cheese -- 1 tablespoon (Tbsp) has 15 mg.  Cottage cheese --  cup (4 oz) has 14 mg.  Low-fat (1%) milk -- 1 cup (8 oz) has 10 mg.  Sour cream -- 1 Tbsp has 8.5 mg.  Low-fat yogurt -- 1 cup (8 oz) has 8 mg.  Nonfat Greek yogurt -- 1 cup (8 oz) has 7 mg.  Half-and-half cream -- 1 Tbsp has 5 mg. Fats and oils  Cod liver oil -- 1 tablespoon (Tbsp) has 82 mg.  Butter -- 1 Tbsp has 15 mg.  Lard -- 1 Tbsp has 14 mg.  Bacon grease -- 1 Tbsp has 14 mg.  Mayonnaise -- 1 Tbsp has 5-10 mg.  Margarine -- 1 Tbsp has 3-10 mg. Exact amounts of cholesterol in these foods may vary depending on specific ingredients and brands. Foods without cholesterol Most plant-based foods do not have cholesterol unless you combine them with a food that has cholesterol. Foods without cholesterol include:  Grains and cereals.  Vegetables.  Fruits.  Vegetable oils, such as olive, canola, and sunflower oil.  Legumes, such as peas, beans, and lentils.  Nuts and seeds.  Egg whites. Summary  The body needs cholesterol in small amounts, but too much cholesterol can cause damage to the arteries and heart.  Most people should eat less than 200 milligrams (mg) of  cholesterol a day. This information is not intended to replace advice given to you by your health care provider. Make sure you discuss any questions you have with your health care provider. Document Revised: 02/23/2017 Document Reviewed: 11/07/2016 Elsevier Patient Education  [place].

## 2019-06-26 ENCOUNTER — Other Ambulatory Visit: Payer: Self-pay | Admitting: Family Medicine

## 2019-06-26 DIAGNOSIS — I10 Essential (primary) hypertension: Secondary | ICD-10-CM

## 2019-06-26 DIAGNOSIS — I2510 Atherosclerotic heart disease of native coronary artery without angina pectoris: Secondary | ICD-10-CM

## 2019-06-26 NOTE — Telephone Encounter (Signed)
Apologies on delay - I am out of town.

## 2019-07-03 ENCOUNTER — Emergency Department: Payer: Medicare Other

## 2019-07-03 ENCOUNTER — Other Ambulatory Visit: Payer: Self-pay

## 2019-07-03 ENCOUNTER — Encounter: Payer: Self-pay | Admitting: Emergency Medicine

## 2019-07-03 ENCOUNTER — Emergency Department
Admission: EM | Admit: 2019-07-03 | Discharge: 2019-07-03 | Disposition: A | Discharge status: Home / Self Care | Payer: Medicare Other | Attending: Emergency Medicine | Admitting: Emergency Medicine

## 2019-07-03 ENCOUNTER — Telehealth: Payer: Self-pay

## 2019-07-03 DIAGNOSIS — R079 Chest pain, unspecified: Secondary | ICD-10-CM | POA: Diagnosis not present

## 2019-07-03 DIAGNOSIS — R0789 Other chest pain: Secondary | ICD-10-CM | POA: Insufficient documentation

## 2019-07-03 DIAGNOSIS — Z5321 Procedure and treatment not carried out due to patient leaving prior to being seen by health care provider: Secondary | ICD-10-CM | POA: Diagnosis not present

## 2019-07-03 DIAGNOSIS — R0602 Shortness of breath: Secondary | ICD-10-CM | POA: Insufficient documentation

## 2019-07-03 LAB — BASIC METABOLIC PANEL
Anion gap: 8 (ref 5–15)
BUN: 19 mg/dL (ref 8–23)
CO2: 25 mmol/L (ref 22–32)
Calcium: 9.7 mg/dL (ref 8.9–10.3)
Chloride: 104 mmol/L (ref 98–111)
Creatinine, Ser: 1.01 mg/dL (ref 0.61–1.24)
GFR calc Af Amer: >60 mL/min (ref >60)
GFR calc non Af Amer: >60 mL/min (ref >60)
Glucose, Bld: 108 mg/dL — ABNORMAL HIGH (ref 70–99)
Potassium: 4.6 mmol/L (ref 3.5–5.1)
Sodium: 137 mmol/L (ref 135–145)

## 2019-07-03 LAB — CBC
HCT: 45.8 % (ref 39.0–52.0)
Hemoglobin: 15.1 g/dL (ref 13.0–17.0)
MCH: 29 pg (ref 26.0–34.0)
MCHC: 33 g/dL (ref 30.0–36.0)
MCV: 88.1 fL (ref 80.0–100.0)
Platelets: 200 10*3/uL (ref 150–400)
RBC: 5.2 MIL/uL (ref 4.22–5.81)
RDW: 14.3 % (ref 11.5–15.5)
WBC: 11.3 10*3/uL — ABNORMAL HIGH (ref 4.0–10.5)
nRBC: 0 % (ref 0.0–0.2)

## 2019-07-03 LAB — TROPONIN I (HIGH SENSITIVITY)
Troponin I (High Sensitivity): 12 ng/L (ref <18)
Troponin I (High Sensitivity): 10 ng/L (ref <18)

## 2019-07-03 MED ORDER — SODIUM CHLORIDE 0.9% FLUSH: 3.0000 mL | Freq: Once | INTRAVENOUS | Status: DC

## 2019-07-03 NOTE — Telephone Encounter (Signed)
He has CAD and CHF. To ER

## 2019-07-03 NOTE — ED Notes (Signed)
Pt wanting to leave. Ambulatory without distress.  Alert and oriented.

## 2019-07-03 NOTE — ED Triage Notes (Signed)
Pt via pov from home with chest pain x 3-4 days. Pt reports that it got better for a while yesterday and then was worse last night and today. Pain is on left side, intensity is variable. Denies n/v; states sometimes he has had some sob with it. Pt alert & oriented, nad noted.

## 2019-07-03 NOTE — Telephone Encounter (Signed)
Pt called the triage line stating he has had chest pain for 3 days. He has taken Alka Seltzer and omeprazole with no relief. Denies nausea, jaw pain, arm pain, or injury. Says he has had a headache. Asked to see Dr Lorelei Pont today. He has a cardiologist (Dr Rockey Situ). He has not called them.  I spoke to Dr Lorelei Pont to see what he would recommend: ER or visit with [place] today.  Dr Lorelei Pont stated that with his medical history, he needs to go to the ER. I advised pt what Dr Lorelei Pont recommended. He will go to [facility].

## 2019-07-17 ENCOUNTER — Emergency Department
Admission: EM | Admit: 2019-07-17 | Discharge: 2019-07-17 | Disposition: A | Discharge status: Home / Self Care | Payer: Medicare Other | Attending: Emergency Medicine | Admitting: Emergency Medicine

## 2019-07-17 ENCOUNTER — Other Ambulatory Visit: Payer: Self-pay

## 2019-07-17 ENCOUNTER — Emergency Department: Payer: Medicare Other

## 2019-07-17 ENCOUNTER — Telehealth: Payer: Self-pay

## 2019-07-17 DIAGNOSIS — Z7901 Long term (current) use of anticoagulants: Secondary | ICD-10-CM | POA: Insufficient documentation

## 2019-07-17 DIAGNOSIS — J45909 Unspecified asthma, uncomplicated: Secondary | ICD-10-CM | POA: Insufficient documentation

## 2019-07-17 DIAGNOSIS — R1010 Upper abdominal pain, unspecified: Secondary | ICD-10-CM | POA: Diagnosis not present

## 2019-07-17 DIAGNOSIS — Z79899 Other long term (current) drug therapy: Secondary | ICD-10-CM | POA: Insufficient documentation

## 2019-07-17 DIAGNOSIS — Z87891 Personal history of nicotine dependence: Secondary | ICD-10-CM | POA: Diagnosis not present

## 2019-07-17 DIAGNOSIS — I10 Essential (primary) hypertension: Secondary | ICD-10-CM | POA: Diagnosis not present

## 2019-07-17 DIAGNOSIS — R079 Chest pain, unspecified: Secondary | ICD-10-CM | POA: Diagnosis not present

## 2019-07-17 DIAGNOSIS — Z955 Presence of coronary angioplasty implant and graft: Secondary | ICD-10-CM | POA: Diagnosis not present

## 2019-07-17 DIAGNOSIS — I2510 Atherosclerotic heart disease of native coronary artery without angina pectoris: Secondary | ICD-10-CM | POA: Insufficient documentation

## 2019-07-17 DIAGNOSIS — Z96653 Presence of artificial knee joint, bilateral: Secondary | ICD-10-CM | POA: Diagnosis not present

## 2019-07-17 DIAGNOSIS — K859 Acute pancreatitis without necrosis or infection, unspecified: Secondary | ICD-10-CM | POA: Diagnosis not present

## 2019-07-17 DIAGNOSIS — R0789 Other chest pain: Secondary | ICD-10-CM | POA: Diagnosis not present

## 2019-07-17 DIAGNOSIS — I4891 Unspecified atrial fibrillation: Secondary | ICD-10-CM | POA: Diagnosis not present

## 2019-07-17 LAB — BASIC METABOLIC PANEL
Anion gap: 11 (ref 5–15)
BUN: 22 mg/dL (ref 8–23)
CO2: 21 mmol/L — ABNORMAL LOW (ref 22–32)
Calcium: 9.1 mg/dL (ref 8.9–10.3)
Chloride: 100 mmol/L (ref 98–111)
Creatinine, Ser: 1.18 mg/dL (ref 0.61–1.24)
GFR calc Af Amer: >60 mL/min (ref >60)
GFR calc non Af Amer: 58 mL/min — ABNORMAL LOW (ref >60)
Glucose, Bld: 101 mg/dL — ABNORMAL HIGH (ref 70–99)
Potassium: 4.6 mmol/L (ref 3.5–5.1)
Sodium: 132 mmol/L — ABNORMAL LOW (ref 135–145)

## 2019-07-17 LAB — CBC
HCT: 47 % (ref 39.0–52.0)
Hemoglobin: 15.2 g/dL (ref 13.0–17.0)
MCH: 28.6 pg (ref 26.0–34.0)
MCHC: 32.3 g/dL (ref 30.0–36.0)
MCV: 88.3 fL (ref 80.0–100.0)
Platelets: 196 10*3/uL (ref 150–400)
RBC: 5.32 MIL/uL (ref 4.22–5.81)
RDW: 14.3 % (ref 11.5–15.5)
WBC: 13.1 10*3/uL — ABNORMAL HIGH (ref 4.0–10.5)
nRBC: 0 % (ref 0.0–0.2)

## 2019-07-17 LAB — HEPATIC FUNCTION PANEL
ALT: 83 U/L — ABNORMAL HIGH (ref 0–44)
AST: 61 U/L — ABNORMAL HIGH (ref 15–41)
Albumin: 3.2 g/dL — ABNORMAL LOW (ref 3.5–5.0)
Alkaline Phosphatase: 140 U/L — ABNORMAL HIGH (ref 38–126)
Bilirubin, Direct: 0.1 mg/dL (ref 0.0–0.2)
Indirect Bilirubin: 0.6 mg/dL (ref 0.3–0.9)
Total Bilirubin: 0.7 mg/dL (ref 0.3–1.2)
Total Protein: 6.7 g/dL (ref 6.5–8.1)

## 2019-07-17 LAB — LIPASE, BLOOD: Lipase: 86 U/L — ABNORMAL HIGH (ref 11–51)

## 2019-07-17 LAB — TROPONIN I (HIGH SENSITIVITY)
Troponin I (High Sensitivity): 13 ng/L (ref <18)
Troponin I (High Sensitivity): 11 ng/L (ref <18)

## 2019-07-17 MED ORDER — SODIUM CHLORIDE 0.9% FLUSH: 3.0000 mL | Freq: Once | INTRAVENOUS | Status: DC

## 2019-07-17 MED ORDER — TRAMADOL HCL 50 MG PO TABS: 50.0000 mg | ORAL_TABLET | Freq: Four times a day (QID) | ORAL | 0 refills | Status: DC | PRN

## 2019-07-17 NOTE — ED Provider Notes (Signed)
[facility] Emergency Department Provider Note  Time seen: 5:49 PM  I have reviewed the triage vital signs and the nursing notes.   HISTORY  Chief Complaint Chest Pain   HPI Hunter Young is a 79 y.o. male with a past medical history of cardiomegaly, gastric reflux, A. fib on Eliquis, hypertension, hyperlipidemia, presents to the emergency department for left-sided chest and abdominal discomfort.  According to the patient over the past for 5 weeks he has been experiencing intermittent discomfort in his left-sided chest and abdomen.  Patient states he was seen in the emergency department 07/03/2019 for evaluation but left before being seen after a very long wait.  Patient states he continued to have discomfort he called his doctor who recommended he come back to the emergency department for evaluation.  Denies any fever.  Denies shortness of breath.  States very minimal discomfort at this time and left chest and left abdomen.  Patient does admit to daily alcohol use.  Denies any dysuria vomiting or diarrhea.  No cough.   Past Medical History:  Diagnosis Date  . ALLERGIC RHINITIS 04/22/2007  . ASTHMA, INTRINSIC NOS 12/27/2006  . BENIGN PROSTATIC HYPERTROPHY, WITH URINARY OBSTRUCTION 06/25/2007  . Cardiomegaly 05/30/2007  . Carotid stenosis 01/29/2014  . CERVICAL DISORDER, NOS 10/14/2009  . CORONARY ARTERY DISEASE 12/03/2006  . DIVERTICULOSIS OF COLON 05/20/2001  . Dyshidrosis 06/25/2007  . GERD 12/06/2007  . HYPERLIPIDEMIA 12/03/2006  . HYPERTENSION 12/03/2006  . INTERNAL HEMORRHOIDS 05/20/2001  . OBESITY, MODERATE 12/05/2008  . OSTEOARTHRITIS 06/25/2007  . VENOUS INSUFFICIENCY 12/04/2006    Patient Active Problem List   Diagnosis Date Noted  . Occipital stroke ([place]) 04/02/2018  . OAB (overactive bladder) 03/08/2018  . Stenosis of right carotid artery 01/29/2014  . Severe obesity (BMI >= 40) ([place]) 04/02/2013  . CERVICAL DISORDER, NOS 10/14/2009  . GERD 12/06/2007  . BPH  (benign prostatic hypertrophy) with urinary obstruction 06/25/2007  . OSTEOARTHRITIS 06/25/2007  . Cardiomegaly 05/30/2007  . Allergic rhinitis 04/22/2007  . VENOUS INSUFFICIENCY 12/04/2006  . Hyperlipidemia LDL goal <70 12/03/2006  . Essential hypertension 12/03/2006  . Coronary artery disease involving native coronary artery without angina pectoris 12/03/2006  . DIVERTICULOSIS OF COLON 05/20/2001    Past Surgical History:  Procedure Laterality Date  . ANGIOPLASTY     2 stents  . FOOT SURGERY    . JOINT REPLACEMENT     bilat knee  . TONSILLECTOMY      Prior to Admission medications   Medication Sig Start Date End Date Taking? Authorizing Provider  atorvastatin (LIPITOR) 40 MG tablet TAKE 1 TABLET(40 MG) BY MOUTH DAILY 12/16/18   [facility], Frederico Hamman, MD  ELIQUIS 5 MG TABS tablet TAKE 1 TABLET(5 MG) BY MOUTH TWICE DAILY 11/22/18   Minna Merritts, MD  metoprolol succinate (TOPROL XL) 25 MG 24 hr tablet Take 1 tablet (25 mg total) by mouth daily. Patient not taking: Reported on 06/25/2019 12/19/18   Minna Merritts, MD  mirabegron ER (MYRBETRIQ) 50 MG TB24 tablet Take 1 tablet (50 mg total) by mouth daily. 06/23/19   Debroah Loop, PA-C  Multiple Vitamins-Minerals (PRESERVISION AREDS) TABS Take by mouth. Take 1 tablet by mouth twice daily    [provider]  nitroGLYCERIN (NITROSTAT) 0.4 MG SL tablet Place 1 tablet (0.4 mg total) under the tongue every 5 (five) minutes as needed. 08/12/12   Minna Merritts, MD    Allergies  Allergen Reactions  . Oxycodone-Aspirin Nausea And Vomiting  Family History  Problem Relation Age of Onset  . Colon cancer Mother 29    Social History Social History   Tobacco Use  . Smoking status: Former Smoker    Quit date: 08/21/1980    Years since quitting: 38.9  . Smokeless tobacco: Never Used  Substance Use Topics  . Alcohol use: Yes    Comment: 14  . Drug use: No    Review of Systems Constitutional: Negative for  fever. Cardiovascular: Intermittent left chest pain Respiratory: Negative for shortness of breath. Gastrointestinal: Intermittent left abdominal discomfort.  Negative for vomiting or diarrhea Genitourinary: Negative for urinary compaints Musculoskeletal: Negative for musculoskeletal complaints Neurological: Negative for headache All other ROS negative  ____________________________________________   PHYSICAL EXAM:  VITAL SIGNS: ED Triage Vitals  Enc Vitals Group     BP 07/17/19 1601 (!) 127/100     Pulse Rate 07/17/19 1601 92     Resp 07/17/19 1601 (!) 21     Temp 07/17/19 1601 97.8 F (36.6 C)     Temp src --      SpO2 07/17/19 1601 96 %     Weight 07/17/19 1602 255 lb 1.2 oz (115.7 kg)     Height 07/17/19 1602 5\' 7"  (1.702 m)     Head Circumference --      Peak Flow --      Pain Score 07/17/19 1602 4     Pain Loc --      Pain Edu? --      Excl. in [place]? --    Constitutional: Alert and oriented. Well appearing and in no distress. Eyes: Normal exam ENT      Head: Normocephalic and atraumatic.      Mouth/Throat: Mucous membranes are moist. Cardiovascular: Normal rate, regular rhythm. No murmur Respiratory: Normal respiratory effort without tachypnea nor retractions. Breath sounds are clear  Gastrointestinal: Very slight epigastric discomfort.  No rebound guarding or distention.  Abdomen otherwise benign. Musculoskeletal: Nontender with normal range of motion in all extremities.  Neurologic:  Normal speech and language. No gross focal neurologic deficits Skin:  Skin is warm, dry and intact.  Psychiatric: Mood and affect are normal.   ____________________________________________    EKG  EKG viewed and interpreted by myself shows what appears to be atrial fibrillation at 90 bpm with a slightly widened QRS, left axis, nonspecific ST changes.  Occasional PVC.  ____________________________________________    RADIOLOGY  Chest x-ray  normal  ____________________________________________   INITIAL IMPRESSION / ASSESSMENT AND PLAN / ED COURSE  Pertinent labs & imaging results that were available during my care of the patient were reviewed by me and considered in my medical decision making (see chart for details).   Patient presents to the emergency department for intermittent left chest abdominal discomfort over the past 1 month or so.  Patient seen 4/8 for the same.  Returns today as he was not seen by a physician on 4/8 before leaving and his doctor wanted him evaluated here.  Patient has a cardiologist Dr. Rockey Situ but has not seen in quite some time per patient.  Denies any significant discomfort at this time but states it comes and goes.  Patient's labs are overall reassuring troponin is normal and unchanged from the 2 values on the eighth.  Chest x-ray is clear.  EKG does show occasional PVCs.  I have added on a lipase and hepatic function panel as well.  We will continue to closely monitor the patient.  Overall he appears quite  well with no distress at this time.  Patient's lab work shows a negative troponin but a slightly elevated lipase as well as LFTs.  Patient is a daily drinker however we will obtain right upper quadrant ultrasound to further evaluate.  Patient agreeable to plan of care.  Ultrasound is negative.  Highly suspect the patient is suffering from mild pancreatitis likely alcohol induced.  Discussed with the patient attempting to abstain from alcohol, drinking plenty of fluids and following up with his doctor.  I also discussed return precautions.  Patient agreeable to plan of care.  YOUSEF INLOW was evaluated in Emergency Department on 07/17/2019 for the symptoms described in the history of present illness. He was evaluated in the context of the global COVID-19 pandemic, which necessitated consideration that the patient might be at risk for infection with the SARS-CoV-2 virus that causes COVID-19. Institutional  protocols and algorithms that pertain to the evaluation of patients at risk for COVID-19 are in a state of rapid change based on information released by regulatory bodies including the CDC and federal and state organizations. These policies and algorithms were followed during the patient's care in the ED.  ____________________________________________   FINAL CLINICAL IMPRESSION(S) / ED DIAGNOSES  Chest pain Abdominal pain Pancreatitis   Harvest Dark, MD 07/17/19 2020

## 2019-07-17 NOTE — ED Notes (Signed)
MD Paduchowski at bedside to discuss lab results and discharge instructions.

## 2019-07-17 NOTE — Telephone Encounter (Signed)
Per chart review tab pt is at [facility] ED and has been triaged. FYI to Dr Lorelei Pont.

## 2019-07-17 NOTE — Telephone Encounter (Signed)
[facility] - Client TELEPHONE ADVICE RECORD AccessNurse Patient Name: Hunter Young Gender: Male DOB: 01-Apr-1940 Age: 79 Y 2 D Return Phone Number: [phone] (Primary), [phone] (Secondary) Address: [place] [address] Client [place] [facility] - Client Client [facility] - Day Physician Hunter Young, Hunter Young - MD Contact Type Call Who Is Calling Patient / Member / Family / Caregiver Call Type Triage / Clinical Relationship To Patient Self Return Phone Number [phone] (Primary) Chief Complaint CHEST PAIN (>=21 years) - pain, pressure, heaviness or tightness Reason for Call Symptomatic / Request for [place] states he is having chest and stomach pains, and wondering what to do. One other time, declined ER. Translation No Nurse Assessment Nurse: Raphael Gibney, RN, Vanita Ingles Date/Time (Eastern Time): 07/17/2019 2:02:38 PM Confirm and document reason for call. If symptomatic, describe symptoms. ---Caller states he has chest pain and abd pain. he had chest pain and abd pain several weeks ago. he was instructed to go to the ER but he did not go. Has has upper abd pain and has chest pain in the middle of his chest to the left. no appetite. Has the patient had close contact with a person known or suspected to have the novel coronavirus illness OR traveled / lives in area with major community spread (including international travel) in the last 14 days from the onset of symptoms? * If Asymptomatic, screen for exposure and travel within the last 14 days. ---No Does the patient have any new or worsening symptoms? ---Yes Will a triage be completed? ---Yes Related visit to physician within the last 2 weeks? ---No Does the PT have any chronic conditions? (i.e. diabetes, asthma, this includes High risk factors for pregnancy, etc.) ---Yes List chronic conditions. ---heart  problems Is this a behavioral health or substance abuse call? ---No Guidelines Guideline Title Affirmed Question Affirmed Notes Nurse Date/Time Eilene Ghazi Time) Chest Pain [1] Chest pain lasts > 5 minutes AND [2] age > 64 Raphael Gibney, RN, Vanita Ingles 07/17/2019 2:07:54 PM PLEASE NOTE: All timestamps contained within this report are represented as [place] Standard Time. CONFIDENTIALTY NOTICE: This fax transmission is intended only for the addressee. It contains information that is legally privileged, confidential or otherwise protected from use or disclosure. If you are not the intended recipient, you are strictly prohibited from reviewing, disclosing, copying using or disseminating any of this information or taking any action in reliance on or regarding this information. If you have received this fax in error, please notify us immediately by telephone so that we can arrange for its return to [place]. Phone: [phone], Toll-Free: [phone], Fax: [phone] Page: 2 of 2 Call Id: [id number] [place]. Time Eilene Ghazi Time) Disposition Final User 07/17/2019 2:00:59 PM Send to Urgent Queue Rosana Fret 07/17/2019 2:20:08 PM 911 Outcome Documentation Raphael Gibney, RN, Vanita Ingles Reason: attempted 911 outcome documentation call and pt will wait for call from office. 07/17/2019 2:13:51 PM Call EMS 911 Now Yes Raphael Gibney, RN, Doreatha Lew Disagree/Comply Disagree Caller Understands Yes PreDisposition [place] Advice Given Per Guideline CALL EMS 911 NOW: * Immediate medical attention is needed. You need to hang up and call 911 (or an ambulance). CARE ADVICE given per Chest Pain (Adult) guideline. Comments User: Dannielle Burn, RN Date/Time Eilene Ghazi Time): 07/17/2019 2:13:33 PM pt states he is not going to the ER or calling 911 but wants to make appt with Dr. Garnet Koyanagi on Monday or Tuesday. User: Dannielle Burn, RN Date/Time Eilene Ghazi Time): 07/17/2019  2:18:54 PM called office and gave report that pt has chest pain  and upper abd pain. triage outcome of call 911 but pt is refusing and wants doctor's appt when he is free on Monday or Tuesday. Referrals GO TO FACILITY REFUSED

## 2019-07-17 NOTE — ED Notes (Signed)
E-signature not working at this time. Pt verbalized understanding of D/C instructions, prescriptions and follow up care with no further questions at this time. Pt in NAD and ambulatory at time of D/C.  

## 2019-07-17 NOTE — Discharge Instructions (Addendum)
Please drink plenty of fluids.  Please abstain from alcohol use.  Please take your pain medication as needed but only as prescribed.  Please follow-up with your doctor in 2 to 3 days for recheck/reevaluation.  Return to the emergency department for any significant increase in pain, or any other symptom personally concerning to yourself.

## 2019-07-17 NOTE — ED Notes (Signed)
Pt refusing continuous vital sign monitoring at this time. MD aware.

## 2019-07-17 NOTE — ED Triage Notes (Addendum)
Pt comes via POV from home with c/o chest pain that started a few days ago. Pt states he was evaluated in ED and was never informed of what the test results showed.  Pt states mid lower sternal pain. Pt states earlier today it started hurting on the left upper side. Pt states some SOB as well.  Pt states his PCP told him he needed to come  Back here and get evaluated.  Pt appears weak and little unsteady. Pt states he has felt weak and some dizziness.

## 2019-07-17 NOTE — Telephone Encounter (Signed)
Pt is having lt sided chest pain today; I spoke with pt and he does not want to go to ED. I advised pt that Dr Lorelei Pont said that he has a hx of heart issues and a stroke and the EKG from [facility] ED on 07/03/19 showed pts heart was out of rhythm and pt needs to go to ED now for evaluation and pt should not leave until ED Doctor evaluates pt and advises him of what he should do. Pt wanted to know if he would be admitted and I told pt we will not know until evaluated at ED. Pt declined 911 and said his wife will take him to ED now. I called [facility] ED spoke with Amy in triage as FYI that pt is on his way by car now; pt was appreciative. Pt is presently on his way by car to [facility] ED. FYI to Dr Lorelei Pont,

## 2019-07-23 ENCOUNTER — Encounter: Payer: Self-pay | Admitting: Family Medicine

## 2019-07-23 ENCOUNTER — Ambulatory Visit (INDEPENDENT_AMBULATORY_CARE_PROVIDER_SITE_OTHER): Payer: Medicare Other | Admitting: Family Medicine

## 2019-07-23 ENCOUNTER — Other Ambulatory Visit: Payer: Self-pay

## 2019-07-23 VITALS — BP 110/70 | HR 92 | Temp 98.0°F | Ht 66.5 in | Wt 250.2 lb

## 2019-07-23 DIAGNOSIS — I2510 Atherosclerotic heart disease of native coronary artery without angina pectoris: Secondary | ICD-10-CM | POA: Diagnosis not present

## 2019-07-23 DIAGNOSIS — R7989 Other specified abnormal findings of blood chemistry: Secondary | ICD-10-CM

## 2019-07-23 DIAGNOSIS — K8500 Idiopathic acute pancreatitis without necrosis or infection: Secondary | ICD-10-CM

## 2019-07-23 DIAGNOSIS — R0789 Other chest pain: Secondary | ICD-10-CM

## 2019-07-23 DIAGNOSIS — Z789 Other specified health status: Secondary | ICD-10-CM

## 2019-07-23 DIAGNOSIS — Z7289 Other problems related to lifestyle: Secondary | ICD-10-CM | POA: Diagnosis not present

## 2019-07-23 DIAGNOSIS — I639 Cerebral infarction, unspecified: Secondary | ICD-10-CM

## 2019-07-23 LAB — HEPATIC FUNCTION PANEL
ALT: 98 U/L — ABNORMAL HIGH (ref 0–53)
AST: 58 U/L — ABNORMAL HIGH (ref 0–37)
Albumin: 3.7 g/dL (ref 3.5–5.2)
Alkaline Phosphatase: 300 U/L — ABNORMAL HIGH (ref 39–117)
Bilirubin, Direct: 0.2 mg/dL (ref 0.0–0.3)
Total Bilirubin: 0.5 mg/dL (ref 0.2–1.2)
Total Protein: 7.5 g/dL (ref 6.0–8.3)

## 2019-07-23 NOTE — Progress Notes (Signed)
Piper Hassebrock T. Majesti Gambrell, MD, [place] at [facility] [place], [address]  Phone: [phone]  FAX: [phone]  ROMON DEVEREUX - 79 y.o. male  MRN [id number]  Date of Birth: [birth year]  Date: 07/23/2019  PCP: Owens Loffler, MD  Referral: Owens Loffler, MD  Chief Complaint  Patient presents with  . Hospitalization Follow-up    Pancreatitis    This visit occurred during the SARS-CoV-2 public health emergency.  Safety protocols were in place, including screening questions prior to the visit, additional usage of staff PPE, and extensive cleaning of exam room while observing appropriate contact time as indicated for disinfecting solutions.   Subjective:   Hunter Young is a 79 y.o. very pleasant male patient with Body mass index is 39.79 kg/m. who presents with the following:  ER follow-up pancreatitis:  6 days ago.  He called into our office with having some chest pain, and on further discussion with the patient he was more having epigastric pain.  He was seen in the ER and ultimately diagnosed with alcoholic pancreatitis.  At that time his liver enzymes were also mildly elevated.  On further questioning the patient tells me that he he drinks at least 4 shots of liquor every night.  At this point he is ready to stop drinking if it will alleviate any of these further symptoms.  He does have upcoming follow-up in the next few months with his cardiologist.  His troponins were all negative in the hospital.  Move up cards appointment   Review of Systems is noted in the HPI, as appropriate  Objective:   BP 110/70   Pulse 92   Temp 98 F (36.7 C) (Temporal)   Ht 5' 6.5" (1.689 m)   Wt 250 lb 4 oz (113.5 kg)   SpO2 98%   BMI 39.79 kg/m   GEN: No acute distress; alert,appropriate. PULM: Breathing comfortably in no respiratory distress PSYCH: Normally interactive.  CV: RRR, no  m/g/r PULM: Normal respiratory rate, no accessory muscle use. No wheezes, crackles or rhonchi  ABD: S, NT, ND, + BS, No rebound, No HSM   Laboratory and Imaging Data: DG Chest 2 View  Result Date: 07/17/2019 CLINICAL DATA:  Chest pain for several days, mid to lower sternal pain EXAM: CHEST - 2 VIEW COMPARISON:  07/03/2019 FINDINGS: Frontal and lateral views of the chest demonstrate a stable cardiac silhouette. No airspace disease, effusion, or pneumothorax. The lungs are hyperinflated. No acute bony abnormality. IMPRESSION: 1. Stable exam, no acute process. Electronically Signed   By: Randa Ngo M.D.   On: 07/17/2019 16:43   DG Chest 2 View  Result Date: 07/03/2019 CLINICAL DATA:  Pt via pov from home with chest pain x 3-4 days. Pt reports that it got better for a while yesterday and then was worse last night and today. Pain is on left side, intensity is variable. Denies n/v; states sometimes he has had some sob with it. EXAM: CHEST - 2 VIEW COMPARISON:  06/18/2015 FINDINGS: Cardiac silhouette is mildly enlarged. No mediastinal or hilar masses. No evidence adenopathy. There are thickened bilateral bronchovascular markings. This is similar to prior studies. No lung consolidation. No pleural effusion or pneumothorax. Skeletal structures are grossly intact. IMPRESSION: No acute cardiopulmonary disease. Electronically Signed   By: Lajean Manes M.D.   On: 07/03/2019 13:46   US ABDOMEN LIMITED RUQ  Result Date: 07/17/2019 CLINICAL DATA:  Upper abdominal pain for several weeks EXAM: ULTRASOUND ABDOMEN LIMITED RIGHT UPPER QUADRANT COMPARISON:  None. FINDINGS: Gallbladder: No gallstones or wall thickening visualized. No sonographic Murphy sign noted by sonographer. Common bile duct: Diameter: 2 mm Liver: No focal lesion identified. Within normal limits in parenchymal echogenicity. Portal vein is patent on color Doppler imaging with normal direction of blood flow towards the liver. Other: None. IMPRESSION:  1. Unremarkable right upper quadrant ultrasound. Electronically Signed   By: Randa Ngo M.D.   On: 07/17/2019 19:46   Lab Review:  CBC EXTENDED Latest Ref Rng & Units 07/17/2019 07/03/2019 10/18/2018  WBC 4.0 - 10.5 K/uL 13.1(H) 11.3(H) 6.9  RBC 4.22 - 5.81 MIL/uL 5.32 5.20 5.17  HGB 13.0 - 17.0 g/dL 15.2 15.1 15.1  HCT 39.0 - 52.0 % 47.0 45.8 46.8  PLT 150 - 400 K/uL 196 200 176.0  NEUTROABS 1.4 - 7.7 K/uL - - 3.8  LYMPHSABS 0.7 - 4.0 K/uL - - 2.4    BMP Latest Ref Rng & Units 07/17/2019 07/03/2019 10/18/2018  Glucose 70 - 99 mg/dL 101(H) 108(H) 145(H)  BUN 8 - 23 mg/dL _0 Creatinine 0.61 - 1.24 mg/dL 1.18 1.01 1.03  Sodium 135 - 145 mmol/L 132(L) 137 140  Potassium 3.5 - 5.1 mmol/L 4.6 4.6 4.1  Chloride 98 - 111 mmol/L 100 104 105  CO2 22 - 32 mmol/L 21(L) 25 27  Calcium 8.9 - 10.3 mg/dL 9.1 9.7 9.4    Hepatic Function Latest Ref Rng & Units 07/23/2019 07/17/2019 10/18/2018  Total Protein 6.0 - 8.3 g/dL 7.5 6.7 6.2  Albumin 3.5 - 5.2 g/dL 3.7 3.2(L) 3.9  AST 0 - 37 U/L 58(H) 61(H) 27  ALT 0 - 53 U/L 98(H) 83(H) 39  Alk Phosphatase 39 - 117 U/L 300(H) 140(H) 76  Total Bilirubin 0.2 - 1.2 mg/dL 0.5 0.7 0.4  Bilirubin, Direct 0.0 - 0.3 mg/dL 0.2 0.1 0.1    Lab Results  Component Value Date   CHOL 142 10/18/2018   Lab Results  Component Value Date   HDL 44.30 10/18/2018   Lab Results  Component Value Date   LDLCALC 82 10/18/2018   Lab Results  Component Value Date   TRIG 83.0 10/18/2018   Lab Results  Component Value Date   CHOLHDL 3 10/18/2018   No results for input(s): PSA in the last 72 hours. No results found for: HCVAB No results found for: VD25OH   Lab Results  Component Value Date   HGBA1C 6.1 10/18/2018   HGBA1C 5.9 04/12/2006   Lab Results  Component Value Date   LDLCALC 82 10/18/2018   CREATININE 1.18 07/17/2019    .last  Assessment and Plan:     ICD-10-CM   1. Idiopathic acute pancreatitis without infection or necrosis  K85.00 CT  ABDOMEN PELVIS W WO CONTRAST  2. Elevated LFTs  R79.89 Hepatic function panel    Hepatitis C antibody    CT ABDOMEN PELVIS W WO CONTRAST  3. Alcohol use  Z72.89 Hepatic function panel    Hepatitis C antibody    CT ABDOMEN PELVIS W WO CONTRAST  4. Other chest pain  R07.89   5. Coronary artery disease involving native coronary artery of native heart without angina pectoris  I25.10   6. Occipital stroke (HCC)  I63.9    Total encounter time: 40 minutes. On the day of the patient encounter, this can include review of prior records, labs, and imaging.  Additional time can include counselling, consultation  with peer MD in person or by telephone.  This also includes independent review of Radiology.  Additional time on extensive chart review.  Elevated lipase in the setting with worsening LFTs.  Normal gallbladder on ultrasound.  Heavy drinker over many years.  Obtain a CT of the abdomen pelvis for evaluation of cirrhosis, pancreatic necrosis, possible hepatocellular carcinoma, or other undiagnosed neoplasm.  Counseled him to DC alcohol indefinitely.  He does have coronary disease and history of stroke.  He is not have any chest pain and his troponins were entirely normal, but he was having chest pain prior to ER visit.  Think it is reasonable to help with follow-up and move up his cardiology appointment.   Follow-up: This will be determined entirely by the patient's upcoming studies.  No orders of the defined types were placed in this encounter.  There are no discontinued medications. Orders Placed This Encounter  Procedures  . CT ABDOMEN PELVIS W WO CONTRAST  . Hepatic function panel  . Hepatitis C antibody    Signed,  Jaiceon Collister T. Antonella Upson, MD   Outpatient Encounter Medications as of 07/23/2019  Medication Sig  . atorvastatin (LIPITOR) 40 MG tablet TAKE 1 TABLET(40 MG) BY MOUTH DAILY  . ELIQUIS 5 MG TABS tablet TAKE 1 TABLET(5 MG) BY MOUTH TWICE DAILY  . mirabegron ER (MYRBETRIQ) 50 MG  TB24 tablet Take 1 tablet (50 mg total) by mouth daily.  . Multiple Vitamins-Minerals (PRESERVISION AREDS) TABS Take by mouth. Take 1 tablet by mouth twice daily  . nitroGLYCERIN (NITROSTAT) 0.4 MG SL tablet Place 1 tablet (0.4 mg total) under the tongue every 5 (five) minutes as needed.  . traMADol (ULTRAM) 50 MG tablet Take 1 tablet (50 mg total) by mouth every 6 (six) hours as needed. (Patient not taking: Reported on 07/23/2019)   No facility-administered encounter medications on file as of 07/23/2019.

## 2019-07-23 NOTE — Patient Instructions (Signed)
REFERRALS TO SPECIALISTS, SPECIAL TESTS (MRI, CT, ULTRASOUNDS)  [facility] or  [facility] will help you.   During the  Covid-19 outbreak we are not having people meet with the patient care coordinators for their protection.  They will call you when your appointment is made.   In the most extreme case (like a stroke), I will try to get them to talk to you in the office, but some insurances require paperwork and authorizations.  This will always take some time.  Specialist appointment times vary a great deal, based on their schedule / openings. -- Some specialists have very long wait times. (Example. Dermatology)    

## 2019-07-24 ENCOUNTER — Other Ambulatory Visit: Payer: Self-pay

## 2019-07-24 ENCOUNTER — Telehealth: Payer: Self-pay

## 2019-07-24 LAB — HEPATITIS C ANTIBODY
Hepatitis C Ab: NONREACTIVE
SIGNAL TO CUT-OFF: 0.01 (ref <1.00)

## 2019-07-24 NOTE — Telephone Encounter (Signed)
-----   Message from Owens Loffler, MD sent at 07/24/2019  6:31 AM EDT ----- Luberta Mutter, would you mind seeing if cardiology could move his appointment up.  He does have an appointment in 3 months.  I think that it would be fine to move this up so that is 3 to 4 weeks.

## 2019-07-24 NOTE — Telephone Encounter (Signed)
done

## 2019-07-24 NOTE — Addendum Note (Signed)
Addended by: Owens Loffler on: 07/24/2019 05:07 PM   Modules accepted: Orders

## 2019-07-24 NOTE — Telephone Encounter (Signed)
No cardiology appt showing in [place].  Spoke w/pt's wife and they are not aware of any cardiology appt.  He sees Dr. Rockey Situ in [place] only.  Do you want to put in a referral for Dr. Rockey Situ.

## 2019-07-26 NOTE — Progress Notes (Signed)
Cardiology Office Note  Date:  07/28/2019   ID:  Hunter Young, DOB [birth year], MRN [id number]  PCP:  Owens Loffler, MD   Chief Complaint  Patient presents with  . OTHER    Chest pain and sob. No complaints today. Meds reviewed verbally with pt.    HPI:  Hunter Young is a very pleasant 79 year old gentleman with a history of  Stroke Permanent Atrial fibrillation CAD  stenting to his left circumflex, last catheterization in 2007 showing 40% proximal LAD disease, 70-80% mid RCA disease, nondominant vessel.  <39% b/l carotid disease in 2019 Periodic ETOH abuse He presents for routine followup of his coronary artery disease, atrial fibrillation  In follow-up today reports he has been in the emergency room twice In the ER 07/03/2019, "did not see anyone", waited 5 hours epigastric pain up into chest Troponin neg x 2  In the Er 07/17/2019, chest pain  5 weeks he has been experiencing intermittent discomfort in his left-sided chest and abdomen.  Troponin neg x 2 RUQ u/s normal  Stopped etoh Epigastric Pain now gone  Reports he is asymptomatic from his atrial fibrillation On last clinic visit reported drinking Shearon Stalls at times Denies shortness of breath on exertion No leg edema  No regular exercise program Legs feel weak, knee pain  EKG personally reviewed by myself on todays visit Shows atrial fibrillation rate 88 bpm left axis deviation  Other past medical history reviewed Echocardiogram 10/2017  reviewed mildly depressed ejection fraction 45 to 50%  moderately dilated left atrium  Other past medical history reviewed Seen in the office with Dr. Edilia Bo 10/10/2017 Had stroke type symptoms  speech, left hand and arm  MRI of the brain (10/17/17)  showed an acute infarction in the right occipital lobe Atrial fib noted on echo 11/01/2017 Started on eliquis 11/01/2017   PMH:   has a past medical history of ALLERGIC RHINITIS (04/22/2007), ASTHMA, INTRINSIC NOS (12/27/2006),  BENIGN PROSTATIC HYPERTROPHY, WITH URINARY OBSTRUCTION (06/25/2007), Cardiomegaly (05/30/2007), Carotid stenosis (01/29/2014), CERVICAL DISORDER, NOS (10/14/2009), CORONARY ARTERY DISEASE (12/03/2006), DIVERTICULOSIS OF COLON (05/20/2001), Dyshidrosis (06/25/2007), GERD (12/06/2007), HYPERLIPIDEMIA (12/03/2006), HYPERTENSION (12/03/2006), INTERNAL HEMORRHOIDS (05/20/2001), OBESITY, MODERATE (12/05/2008), OSTEOARTHRITIS (06/25/2007), and VENOUS INSUFFICIENCY (12/04/2006).  PSH:    Past Surgical History:  Procedure Laterality Date  . ANGIOPLASTY     2 stents  . FOOT SURGERY    . JOINT REPLACEMENT     bilat knee  . TONSILLECTOMY      Current Outpatient Medications  Medication Sig Dispense Refill  . atorvastatin (LIPITOR) 40 MG tablet TAKE 1 TABLET(40 MG) BY MOUTH DAILY 93 tablet 3  . ELIQUIS 5 MG TABS tablet TAKE 1 TABLET(5 MG) BY MOUTH TWICE DAILY 180 tablet 3  . mirabegron ER (MYRBETRIQ) 50 MG TB24 tablet Take 1 tablet (50 mg total) by mouth daily. 30 tablet 11  . Multiple Vitamins-Minerals (PRESERVISION AREDS) TABS Take by mouth. Take 1 tablet by mouth twice daily    . nitroGLYCERIN (NITROSTAT) 0.4 MG SL tablet Place 1 tablet (0.4 mg total) under the tongue every 5 (five) minutes as needed. 25 tablet 6   No current facility-administered medications for this visit.    Allergies:   Oxycodone-aspirin   Social History:  The patient  reports that he quit smoking about 38 years ago. He has never used smokeless tobacco. He reports current alcohol use. He reports that he does not use drugs.   Family History:   family history includes Colon cancer (age of onset: 60) in his  mother.    Review of Systems: Review of Systems  Constitutional: Negative.   HENT: Negative.   Respiratory: Negative.   Cardiovascular: Negative.   Gastrointestinal: Negative.        Epigastric discomfort resolved  Musculoskeletal: Negative.   Neurological: Negative.   Psychiatric/Behavioral: Negative.   All other systems reviewed  and are negative.    PHYSICAL EXAM: VS:  BP 100/78 (BP Location: Left Arm, Patient Position: Sitting, Cuff Size: Large)   Pulse 88   Ht 5\' 7"  (1.702 m)   Wt 250 lb (113.4 kg)   SpO2 98%   BMI 39.16 kg/m  , BMI Body mass index is 39.16 kg/m. Constitutional:  oriented to person, place, and time. No distress. Obese HENT:  Head: Grossly normal Eyes:  no discharge. No scleral icterus.  Neck: No JVD, no carotid bruits  Cardiovascular: Irregularly irregular , no murmurs appreciated Pulmonary/Chest: Clear to auscultation bilaterally, no wheezes or rails Abdominal: Soft.  no distension.  no tenderness.  Musculoskeletal: Normal range of motion Neurological:  normal muscle tone. Coordination normal. No atrophy Skin: Skin warm and dry Psychiatric: normal affect, pleasant   Recent Labs: 07/17/2019: BUN 22; Creatinine, Ser 1.18; Hemoglobin 15.2; Platelets 196; Potassium 4.6; Sodium 132 07/23/2019: ALT 98    Lipid Panel Lab Results  Component Value Date   CHOL 142 10/18/2018   HDL 44.30 10/18/2018   LDLCALC 82 10/18/2018   TRIG 83.0 10/18/2018    Wt Readings from Last 3 Encounters:  07/28/19 250 lb (113.4 kg)  07/23/19 250 lb 4 oz (113.5 kg)  07/17/19 255 lb 1.2 oz (115.7 kg)     ASSESSMENT AND PLAN:  Persistent atrial fibrillation Previous discussions preferred rate control, medical management Tolerating Eliquis Blood pressure low prohibiting use of beta-blockers  Hyperlipidemia LDL goal <70 - Plan: EKG 12-Lead LDL mildly above goal Weight loss recommended  Essential hypertension - Plan: EKG 12-Lead Currently not on any medications.  Blood pressure low  Coronary artery disease involving native coronary artery of native heart without angina pectoris -  Atypical chest pain Recommend he start omeprazole  Stenosis of right carotid artery - Plan: EKG 12-Lead 40 to 50% stenosis on the right October 2014 1-39% stenosis bilaterally August 2019 Continue Lipitor, No further  testing  Severe obesity (BMI >= 40) ([place]) - Plan: EKG 12-Lead We have encouraged continued exercise, careful diet management in an effort to lose weight.   Total encounter time more than 25 minutes  Greater than 50% was spent in counseling and coordination of care with the patient   Disposition:   F/U  12 month   No orders of the defined types were placed in this encounter.    Signed, Esmond Plants, M.D., Ph.D. 07/28/2019  [facility] [place], [place] [phone]

## 2019-07-28 ENCOUNTER — Encounter: Payer: Self-pay | Admitting: Cardiovascular Disease

## 2019-07-28 ENCOUNTER — Other Ambulatory Visit: Payer: Self-pay

## 2019-07-28 ENCOUNTER — Ambulatory Visit (INDEPENDENT_AMBULATORY_CARE_PROVIDER_SITE_OTHER): Payer: Medicare Other | Admitting: Cardiovascular Disease

## 2019-07-28 VITALS — BP 100/78 | HR 88 | Ht 67.0 in | Wt 250.0 lb

## 2019-07-28 DIAGNOSIS — E785 Hyperlipidemia, unspecified: Secondary | ICD-10-CM | POA: Diagnosis not present

## 2019-07-28 DIAGNOSIS — I4819 Other persistent atrial fibrillation: Secondary | ICD-10-CM

## 2019-07-28 DIAGNOSIS — I6310 Cerebral infarction due to embolism of unspecified precerebral artery: Secondary | ICD-10-CM

## 2019-07-28 DIAGNOSIS — I739 Peripheral vascular disease, unspecified: Secondary | ICD-10-CM | POA: Diagnosis not present

## 2019-07-28 DIAGNOSIS — I25118 Atherosclerotic heart disease of native coronary artery with other forms of angina pectoris: Secondary | ICD-10-CM | POA: Diagnosis not present

## 2019-07-28 DIAGNOSIS — I6521 Occlusion and stenosis of right carotid artery: Secondary | ICD-10-CM

## 2019-07-28 DIAGNOSIS — I10 Essential (primary) hypertension: Secondary | ICD-10-CM

## 2019-07-28 NOTE — Patient Instructions (Signed)
Medication Instructions:  No changes  Look for omeprazole 20 mg once a day for acid/chest pain, OTC Ok to take tums with it   If you need a refill on your cardiac medications before your next appointment, please call your pharmacy.    Lab work: No new labs needed   If you have labs (blood work) drawn today and your tests are completely normal, you will receive your results only by: Marland Kitchen MyChart Message (if you have MyChart) OR . A paper copy in the mail If you have any lab test that is abnormal or we need to change your treatment, we will call you to review the results.   Testing/Procedures: No new testing needed   Follow-Up: At [facility], you and your health needs are our priority.  As part of our continuing mission to provide you with exceptional heart care, we have created designated Provider Care Teams.  These Care Teams include your primary Cardiologist (physician) and Advanced Practice Providers (APPs -  Physician Assistants and Nurse Practitioners) who all work together to provide you with the care you need, when you need it.  . You will need a follow up appointment in 12 months .   Marland Kitchen Providers on your designated Care Team:   . Murray Hodgkins, NP . Christell Faith, PA-C . Marrianne Mood, PA-C  Any Other Special Instructions Will Be Listed Below (If Applicable).  For educational health videos Log in to : [email or web address] Or : [email or web address], password : [email or web address]

## 2019-07-30 ENCOUNTER — Telehealth: Payer: Self-pay

## 2019-07-30 ENCOUNTER — Ambulatory Visit
Admission: RE | Admit: 2019-07-30 | Discharge: 2019-07-30 | Disposition: A | Discharge status: Home / Self Care | Payer: Medicare Other | Source: Ambulatory Visit | Attending: Family Medicine | Admitting: Family Medicine

## 2019-07-30 ENCOUNTER — Other Ambulatory Visit: Payer: Self-pay

## 2019-07-30 DIAGNOSIS — R7989 Other specified abnormal findings of blood chemistry: Secondary | ICD-10-CM

## 2019-07-30 DIAGNOSIS — K8500 Idiopathic acute pancreatitis without necrosis or infection: Secondary | ICD-10-CM | POA: Diagnosis not present

## 2019-07-30 DIAGNOSIS — N281 Cyst of kidney, acquired: Secondary | ICD-10-CM | POA: Diagnosis not present

## 2019-07-30 DIAGNOSIS — Z789 Other specified health status: Secondary | ICD-10-CM

## 2019-07-30 DIAGNOSIS — Z7289 Other problems related to lifestyle: Secondary | ICD-10-CM | POA: Insufficient documentation

## 2019-07-30 DIAGNOSIS — F1090 Alcohol use, unspecified, uncomplicated: Secondary | ICD-10-CM

## 2019-07-30 DIAGNOSIS — K859 Acute pancreatitis without necrosis or infection, unspecified: Secondary | ICD-10-CM | POA: Diagnosis not present

## 2019-07-30 MED ORDER — IOHEXOL 300 MG/ML  SOLN
150.0000 mL | Freq: Once | INTRAMUSCULAR | Status: AC | PRN
  Administered 2019-07-30: 125 mL via INTRAVENOUS

## 2019-07-30 NOTE — Telephone Encounter (Signed)
I received a call report from [facility] with [facility] Radiology on patient's CT Abd/Pelvis:  IMPRESSION: 1. Pancreas appears diffusely edematous with mild peripancreatic edema/inflammation. No dilatation of the main pancreatic duct. Some images suggest the presence of a thin rim like capsule. Imaging features are compatible with diffuse pancreatitis and autoimmune pancreatitis would be a distinct consideration. No evidence for pseudocyst. 2. Bilateral renal cysts with tiny bilateral hypoattenuating renal lesions too small to characterize but likely benign. 3. Small left groin hernia contains only fat.

## 2019-07-30 NOTE — Telephone Encounter (Signed)
Results already reviewed

## 2019-08-04 ENCOUNTER — Other Ambulatory Visit: Payer: Medicare Other

## 2019-09-23 ENCOUNTER — Ambulatory Visit: Payer: Medicare Other

## 2019-09-23 ENCOUNTER — Other Ambulatory Visit: Payer: Self-pay

## 2019-09-23 DIAGNOSIS — N3281 Overactive bladder: Secondary | ICD-10-CM

## 2019-09-23 DIAGNOSIS — E785 Hyperlipidemia, unspecified: Secondary | ICD-10-CM

## 2019-09-23 NOTE — Chronic Care Management (AMB) (Addendum)
Chronic Care Management Pharmacy  Name: Hunter Young  MRN: [id number] DOB: [birth year]  Chief Complaint/ HPI  [name],  79 y.o., male presents for their Follow-Up CCM visit with the clinical pharmacist via telephone.  PCP : Owens Loffler, MD  Their chronic conditions include: hypertension, hyperlipidemia, CAD, allergic rhinitis, GERD, osteoarthritis, BPH, OAB, AFIB with history of stroke   Patient concerns:  Unresolved frequent urination  Last CCM visit 06/23/19 - Discussed lifestyle changes for frequent urination, PAP for Eliquis  Office Visits:  07/23/19: [facility] - hospital f/u, counseled to dc alcohol, recommend moving up cardio appt   10/23/18: [facility] - weight loss, Flomax for increased urination  Consult Visit:  07/28/19: Cardiology - AFIB, rate control, tolerating Eliquis, CAD, unable to tolerate BB due to low BP, LDL aove goal recommend weight loss, atypical chest pain, recommend omeprazole 20 mg daily OTC  07/17/19: ED - acute pancreatitis likely alcohol induced  07/03/19: ED - chest pain, pt left before evaluation  05/30/19: BPH - tried finasteride for 6 months, no difference in symptoms of weak stream, nocturia 8-9x, recommend sleep study, start Myrbetriq 50 mg samples - 1 daily   11/22/18: BPH - start finasteride   10/15/18: Cardiology - permanent AFIB/CAD, continue current meds  Allergies  Allergen Reactions  . Oxycodone-Aspirin Nausea And Vomiting   Medications: Outpatient Encounter Medications as of 09/23/2019  Medication Sig  . atorvastatin (LIPITOR) 40 MG tablet TAKE 1 TABLET(40 MG) BY MOUTH DAILY  . ELIQUIS 5 MG TABS tablet TAKE 1 TABLET(5 MG) BY MOUTH TWICE DAILY  . mirabegron ER (MYRBETRIQ) 50 MG TB24 tablet Take 1 tablet (50 mg total) by mouth daily.  . Multiple Vitamins-Minerals (PRESERVISION AREDS) TABS Take by mouth. Take 1 tablet by mouth twice daily  . nitroGLYCERIN (NITROSTAT) 0.4 MG SL tablet Place 1 tablet (0.4 mg total) under the tongue every  5 (five) minutes as needed.   No facility-administered encounter medications on file as of 09/23/2019.   Current Diagnosis/Assessment: Goals    . DIET - EAT MORE FRUITS AND VEGETABLES     Starting 07/31/2017, I will continue to eat 4-5 servings of fresh fruits and vegetables.     . Eat more fruits and vegetables     Starting 07/26/2016, I will continue to eat 3-4 servings of fresh fruits and vegetables.     . Pharmacy Care Plan     CARE PLAN ENTRY  Current Barriers:  . Chronic Disease Management support, education, and care coordination needs related to Hyperlipidemia and Overactive Bladder   Hyperlipidemia Lab Results  Component Value Date/Time   LDLCALC 82 10/18/2018 08:01 AM   LDLCALC 80 06/11/2014 07:59 AM .  Pharmacist Clinical Goal(s): o Over the next 3 months, patient will work with PharmD and providers to achieve LDL goal < 70 . Current regimen:  o Atorvastatin 40 mg - 1 tablet daily . Interventions: o Discussed weight loss goals: - Current weight: 250 lbs. - Goal weight: 220 lbs. . Patient self care activities - Over the next 3 months, patient will: . Slowly increase exercise with goal of 30 minutes, 5 days per week . Incorporate a healthy diet high in vegetables, fruits and whole grains with low-fat dairy products, chicken, fish, legumes, non-tropical vegetable oils and nuts. Limit intake of sweets, sugar-sweetened beverages and red meats.  Overactive Bladder . Pharmacist Clinical Goal(s) o Over the next 3 months, patient will work with PharmD and providers to improve symptoms of frequent urination . Current regimen:  o Myrbetriq 50 mg - 1 daily (started May 30, 2019) . Interventions: o Reviewed medication history and consulted Dr. Lorelei Pont for further management . Patient self care activities - Over the next 3 months patient will: o Continue to avoid alcohol and limit fluid intake  o Continue current medication until further contact from clinic   Medication  management - Cost . Mail application for patient assistance with Eliquis . Please bring application to Dr. Donivan Scull office once completed  Vaccines: . Remain up to date on vaccinations. Recommend tetanus (Tdap) and shingles 2-dose vaccine series (Shingrix) from local pharmacy.   Please see past updates related to this goal by clicking on the "Past Updates" button in the selected goal       Hypertension   CMP Latest Ref Rng & Units 07/23/2019 07/17/2019 07/03/2019  Glucose 70 - 99 mg/dL - 101(H) 108(H)  BUN 8 - 23 mg/dL - 22 19  Creatinine 0.61 - 1.24 mg/dL - 1.18 1.01  Sodium 135 - 145 mmol/L - 132(L) 137  Potassium 3.5 - 5.1 mmol/L - 4.6 4.6  Chloride 98 - 111 mmol/L - 100 104  CO2 22 - 32 mmol/L - 21(L) 25  Calcium 8.9 - 10.3 mg/dL - 9.1 9.7  Total Protein 6.0 - 8.3 g/dL 7.5 6.7 -  Total Bilirubin 0.2 - 1.2 mg/dL 0.5 0.7 -  Alkaline Phos 39 - 117 U/L 300(H) 140(H) -  AST 0 - 37 U/L 58(H) 61(H) -  ALT 0 - 53 U/L 98(H) 83(H) -   Office blood pressures are: BP Readings from Last 3 Encounters:  07/28/19 100/78  07/23/19 110/70  07/17/19 (!) 156/95   BP goal < 140/90 mmHg Patient has failed these meds in the past: metoprolol Patient checks BP at home infrequently Patient home BP readings are ranging: none reported   Patient is currently controlled on the following medications:   No pharmacotherapy  We discussed: Pt has arm cuff monitor but does not check frequently at home. He has not checked since last CCM visit. Assessment: Readings within goal in clinic; LFTs elevated due to acute pancreatitis   Plan: Continue current medications  Hyperlipidemia/CAD   Lipid Panel     Component Value Date/Time   CHOL 142 10/18/2018 0801   CHOL 161 06/11/2014 0759   TRIG 83.0 10/18/2018 0801   HDL 44.30 10/18/2018 0801   HDL 68 06/11/2014 0759   CHOLHDL 3 10/18/2018 0801   VLDL 16.6 10/18/2018 0801   LDLCALC 82 10/18/2018 0801   LDLCALC 80 06/11/2014 0759   LABVLDL 13  06/11/2014 0759    The ASCVD Risk score (Goff DC Jr., et al., 2013) failed to calculate for the following reasons:   The 2013 ASCVD risk score is only valid for ages 63 to 59   The patient has a prior MI or stroke diagnosis   LDL goal < 70  Patient has failed these meds in past: none  Patient is currently uncontrolled on the following medications:   Atorvastatin 40 mg - 1 tablet daily  Nitroglycerin 0.4 mg SL - 1 tablet PRN (hasn't taken in a long time) : Weight goal: 220 lbs Exercise: limited by leg and back pain Diet: trying to lose weight but reports weight had been stable, continues to avoid bread/starchy foods  Plan: Continue current medications; Slowly increase exercise with goal of 30 minutes, 5 days per week. Incorporate a healthy diet high in vegetables, fruits and whole grains with low-fat dairy products, chicken, fish, legumes, non-tropical  vegetable oils and nuts. Limit intake of sweets, sugar-sweetened beverages and red meats.  AFIB   CBC Latest Ref Rng & Units 07/17/2019 07/03/2019 10/18/2018  WBC 4.0 - 10.5 K/uL 13.1(H) 11.3(H) 6.9  Hemoglobin 13.0 - 17.0 g/dL 15.2 15.1 15.1  Hematocrit 39 - 52 % 47.0 45.8 46.8  Platelets 150 - 400 K/uL 196 200 176.0   CHADSVASc: 5 (HTN +1, stroke +2, age > 28 +2)  Patient has failed these meds in past: metoprolol Patient is currently controlled on the following medications:   Eliquis 5 mg - 1 tablet BID  We discussed: cost is concern and likely affecting adherence - pt often has > 5 day gap between refills, recommend using pillbox or adherence packaging. Pt did not receive PAP form for Eliquis. He would like me to mail form again.   Plan: Continue current medications; Mail Eliquis patient assistance form.   Overactive Bladder/BPH   Patient has failed these meds in past: tamsulosin, finasteride  Patient is currently uncontrolled on the following medications:   Myrbetriq 50 mg - 1 daily (started May 30, 2019)  Fluid intake:  significantly reduced since March; has abstained from alcohol since acute pancreatitis in April; avoids sodas, drinks 1-2 bottles of water daily,1 cup of coffee daily  Symptoms: weak stream, urinating every 2 hours throughout day and every 1.5-2 hours at night; denies any change in symptoms since cutting back on alcohol, minimal if any improvement with Myrbetriq   We discussed: Would like new referral for urology, currently seeing Hollice Espy  Plan: Continue current medications; Refer to PCP for further evaluation.  Vaccines   Reviewed and discussed patient's vaccination history. Reports received both doses of the COVID-19 vaccine.   Immunization History  Administered Date(s) Administered  . Hepatitis B 07/22/2008  . Influenza Split 01/19/2011, 12/21/2011  . Influenza Whole 01/10/2007, 12/25/2008  . Influenza, High Dose Seasonal PF 01/10/2017  . Influenza,inj,Quad PF,6+ Mos 12/16/2012, 01/28/2014, 02/11/2015, 11/25/2015, 04/03/2018  . Pneumococcal Conjugate-13 01/28/2014  . Pneumococcal Polysaccharide-23 03/27/2004, 01/19/2011  . Td 03/27/1998, 07/22/2008   Plan: Recommended tetanus and shingles vaccine.   Medication Management  Misc: not taking Tramadol  OTCs: multivitamin - 1 daily (Centrum Silver) (PreserVision Areds 1 BID), Tylenol for headaches  Pharmacy/Benefits: [facility]  Adherence: no concerns   CCM Follow Up:  4 months, telephone visit   [name], PharmD Clinical Pharmacist [facility] at [facility] [phone]

## 2019-10-08 NOTE — Patient Instructions (Addendum)
Dear Hunter Young,  Below is a summary of the goals we discussed during our follow up appointment on September 23, 2019. Please contact me anytime with questions or concerns.   Visit Information  Goals Addressed            This Visit's Progress   . Pharmacy Care Plan       CARE PLAN ENTRY  Current Barriers:  . Chronic Disease Management support, education, and care coordination needs related to Hyperlipidemia and Overactive Bladder   Hyperlipidemia Lab Results  Component Value Date/Time   LDLCALC 82 10/18/2018 08:01 AM   LDLCALC 80 06/11/2014 07:59 AM .  Pharmacist Clinical Goal(s): o Over the next 3 months, patient will work with PharmD and providers to achieve LDL goal < 70 . Current regimen:  o Atorvastatin 40 mg - 1 tablet daily . Interventions: o Discussed weight loss goals: - Current weight: 250 lbs. - Goal weight: 220 lbs. . Patient self care activities - Over the next 3 months, patient will: . Slowly increase exercise with goal of 30 minutes, 5 days per week . Incorporate a healthy diet high in vegetables, fruits and whole grains with low-fat dairy products, chicken, fish, legumes, non-tropical vegetable oils and nuts. Limit intake of sweets, sugar-sweetened beverages and red meats.  Overactive Bladder . Pharmacist Clinical Goal(s) o Over the next 3 months, patient will work with PharmD and providers to improve symptoms of frequent urination . Current regimen:  o Myrbetriq 50 mg - 1 daily (started May 30, 2019) . Interventions: o Reviewed medication history and consulted Dr. Lorelei Pont for further management . Patient self care activities - Over the next 3 months patient will: o Continue to avoid alcohol and limit fluid intake  o Continue current medication until further contact from clinic   Medication management - Cost . Mail application for patient assistance with Xarelto . Please bring application to Dr. Donivan Scull office once completed  Vaccines: . Remain up to  date on vaccinations. Recommend tetanus (Tdap) and shingles 2-dose vaccine series (Shingrix) from local pharmacy.   Please see past updates related to this goal by clicking on the "Past Updates" button in the selected goal        The patient verbalized understanding of instructions provided today and agreed to receive a mailed copy of patient instruction and/or educational materials.  Telephone follow up appointment with pharmacy team member scheduled for: February 17, 2020 at 2 PM (telephone)  [name], PharmD Clinical Pharmacist [facility] at [facility] [phone]   [place] stands for "Dietary Approaches to Stop Hypertension." The DASH eating plan is a healthy eating plan that has been shown to reduce high blood pressure (hypertension). It may also reduce your risk for type 2 diabetes, heart disease, and stroke. The DASH eating plan may also help with weight loss. What are tips for following this plan?  General guidelines  Avoid eating more than 2,300 mg (milligrams) of salt (sodium) a day. If you have hypertension, you may need to reduce your sodium intake to 1,500 mg a day.  Limit alcohol intake to no more than 1 drink a day for nonpregnant women and 2 drinks a day for men. One drink equals 12 oz of beer, 5 oz of wine, or 1 oz of hard liquor.  Work with your health care provider to maintain a healthy body weight or to lose weight. Ask what an ideal weight is for you.  Get at least 30 minutes  of exercise that causes your heart to beat faster (aerobic exercise) most days of the week. Activities may include walking, swimming, or biking.  Work with your health care provider or diet and nutrition specialist (dietitian) to adjust your eating plan to your individual calorie needs. Reading food labels   Check food labels for the amount of sodium per serving. Choose foods with less than 5 percent of the Daily Value of sodium. Generally, foods with less  than 300 mg of sodium per serving fit into this eating plan.  To find whole grains, look for the word "whole" as the first word in the ingredient list. Shopping  Buy products labeled as "low-sodium" or "no salt added."  Buy fresh foods. Avoid canned foods and premade or frozen meals. Cooking  Avoid adding salt when cooking. Use salt-free seasonings or herbs instead of table salt or sea salt. Check with your health care provider or pharmacist before using salt substitutes.  Do not fry foods. Cook foods using healthy methods such as baking, boiling, grilling, and broiling instead.  Cook with heart-healthy oils, such as olive, canola, soybean, or sunflower oil. Meal planning  Eat a balanced diet that includes: ? 5 or more servings of fruits and vegetables each day. At each meal, try to fill half of your plate with fruits and vegetables. ? Up to 6-8 servings of whole grains each day. ? Less than 6 oz of lean meat, poultry, or fish each day. A 3-oz serving of meat is about the same size as a deck of cards. One egg equals 1 oz. ? 2 servings of low-fat dairy each day. ? A serving of nuts, seeds, or beans 5 times each week. ? Heart-healthy fats. Healthy fats called Omega-3 fatty acids are found in foods such as flaxseeds and coldwater fish, like sardines, salmon, and mackerel.  Limit how much you eat of the following: ? Canned or prepackaged foods. ? Food that is high in trans fat, such as fried foods. ? Food that is high in saturated fat, such as fatty meat. ? Sweets, desserts, sugary drinks, and other foods with added sugar. ? Full-fat dairy products.  Do not salt foods before eating.  Try to eat at least 2 vegetarian meals each week.  Eat more home-cooked food and less restaurant, buffet, and fast food.  When eating at a restaurant, ask that your food be prepared with less salt or no salt, if possible. What foods are recommended? The items listed may not be a complete list. Talk  with your dietitian about what dietary choices are best for you. Grains Whole-grain or whole-wheat bread. Whole-grain or whole-wheat pasta. Brown rice. [place]. Bulgur. Whole-grain and low-sodium cereals. Pita bread. Low-fat, low-sodium crackers. Whole-wheat flour tortillas. Vegetables Fresh or frozen vegetables (raw, steamed, roasted, or grilled). Low-sodium or reduced-sodium tomato and vegetable juice. Low-sodium or reduced-sodium tomato sauce and tomato paste. Low-sodium or reduced-sodium canned vegetables. Fruits All fresh, dried, or frozen fruit. Canned fruit in natural juice (without added sugar). Meat and other protein foods Skinless chicken or [place]. Ground chicken or [place]. Pork with fat trimmed off. Fish and seafood. Egg whites. Dried beans, peas, or lentils. Unsalted nuts, nut butters, and seeds. Unsalted canned beans. Lean cuts of beef with fat trimmed off. Low-sodium, lean deli meat. Dairy Low-fat (1%) or fat-free (skim) milk. Fat-free, low-fat, or reduced-fat cheeses. Nonfat, low-sodium ricotta or cottage cheese. Low-fat or nonfat yogurt. Low-fat, low-sodium cheese. Fats and oils Soft margarine without trans fats. Vegetable oil. Low-fat,  reduced-fat, or light mayonnaise and salad dressings (reduced-sodium). Canola, safflower, olive, soybean, and sunflower oils. Avocado. Seasoning and other foods Herbs. Spices. Seasoning mixes without salt. Unsalted popcorn and pretzels. Fat-free sweets. What foods are not recommended? The items listed may not be a complete list. Talk with your dietitian about what dietary choices are best for you. Grains Baked goods made with fat, such as croissants, muffins, or some breads. Dry pasta or rice meal packs. Vegetables Creamed or fried vegetables. Vegetables in a cheese sauce. Regular canned vegetables (not low-sodium or reduced-sodium). Regular canned tomato sauce and paste (not low-sodium or reduced-sodium). Regular tomato and vegetable  juice (not low-sodium or reduced-sodium). Angie Fava. Olives. Fruits Canned fruit in a light or heavy syrup. Fried fruit. Fruit in cream or butter sauce. Meat and other protein foods Fatty cuts of meat. Ribs. Fried meat. Berniece Salines. Sausage. Bologna and other processed lunch meats. Salami. Fatback. Hotdogs. Bratwurst. Salted nuts and seeds. Canned beans with added salt. Canned or smoked fish. Whole eggs or egg yolks. Chicken or [place] with skin. Dairy Whole or 2% milk, cream, and half-and-half. Whole or full-fat cream cheese. Whole-fat or sweetened yogurt. Full-fat cheese. Nondairy creamers. Whipped toppings. Processed cheese and cheese spreads. Fats and oils Butter. Stick margarine. Lard. Shortening. Ghee. Bacon fat. Tropical oils, such as coconut, palm kernel, or palm oil. Seasoning and other foods Salted popcorn and pretzels. Onion salt, garlic salt, seasoned salt, table salt, and sea salt. Worcestershire sauce. Tartar sauce. Barbecue sauce. Teriyaki sauce. Soy sauce, including reduced-sodium. Steak sauce. Canned and packaged gravies. Fish sauce. Oyster sauce. Cocktail sauce. Horseradish that you find on the shelf. Ketchup. Mustard. Meat flavorings and tenderizers. Bouillon cubes. Hot sauce and Tabasco sauce. Premade or packaged marinades. Premade or packaged taco seasonings. Relishes. Regular salad dressings. Where to find more information:  National Heart, Lung, and [place]: https://wilson-eaton.com/  American Heart Association: www.heart.org Summary  The DASH eating plan is a healthy eating plan that has been shown to reduce high blood pressure (hypertension). It may also reduce your risk for type 2 diabetes, heart disease, and stroke.  With the DASH eating plan, you should limit salt (sodium) intake to 2,300 mg a day. If you have hypertension, you may need to reduce your sodium intake to 1,500 mg a day.  When on the DASH eating plan, aim to eat more fresh fruits and vegetables, whole grains,  lean proteins, low-fat dairy, and heart-healthy fats.  Work with your health care provider or diet and nutrition specialist (dietitian) to adjust your eating plan to your individual calorie needs. This information is not intended to replace advice given to you by your health care provider. Make sure you discuss any questions you have with your health care provider. Document Revised: 02/23/2017 Document Reviewed: 03/06/2016 Elsevier Patient Education  2020 [employer].

## 2019-10-14 ENCOUNTER — Telehealth: Payer: Self-pay | Admitting: Family Medicine

## 2019-10-14 DIAGNOSIS — N401 Enlarged prostate with lower urinary tract symptoms: Secondary | ICD-10-CM

## 2019-10-14 DIAGNOSIS — N3281 Overactive bladder: Secondary | ICD-10-CM

## 2019-10-14 NOTE — Telephone Encounter (Signed)
-----   Message from [name], [facility] sent at 10/08/2019 10:58 AM EDT ----- Hi!  Mr. Whisenant is not happy with his urology care and would like to know if he could be referred to a different clinic. He is currently seeing Hollice Espy. This seems to be a common complaint from patients with urology in general.. how do you prefer I handle these situations?   He is still having frequent symptoms on Myrbetriq despite stopping alcohol and limiting his fluids. He may be interested in saw palmetto, but wanted your opinion on the efficacy. From what I have found, it's not recommended due to lack of efficacy. However, I don't think there is much harm in trying it other than maybe cost and lost time. Below are my notes from the visit for more info.  ---------------- Patient has failed these meds in past: tamsulosin, finasteride  Patient is currently uncontrolled on the following medications:  Myrbetriq 50 mg - 1 daily (started May 30, 2019)  Fluid intake: significantly reduced since March; has abstained from alcohol since acute pancreatitis in April; avoids sodas, drinks 1-2 bottles of water daily and 1 cup of coffee daily  Symptoms: weak stream, urinating every 2 hours throughout day and every 1.5-2 hours at night; denies any change in symptoms since cutting back on alcohol, reports minimal if any improvement with Myrbetriq  ----------------------  Thanks so much,  [employer]

## 2019-10-30 ENCOUNTER — Telehealth: Payer: Self-pay | Admitting: Cardiovascular Disease

## 2019-10-30 NOTE — Telephone Encounter (Signed)
Pt c/o medication issue:  1. Name of Medication: Eliquis   2. How are you currently taking this medication (dosage and times per day)? 5 mg po BID  3. Are you having a reaction (difficulty breathing--STAT)? No   4. What is your medication issue?   eliquis is too expensive and wants to know if there is an alternate or if he can decrease the frequency

## 2019-10-31 MED ORDER — ATORVASTATIN CALCIUM 40 MG PO TABS: ORAL_TABLET | ORAL | 3 refills | Status: DC

## 2019-10-31 NOTE — Telephone Encounter (Signed)
Would probably need to discuss with anticoagulation clinic on bridging Would likely recommend stopping Eliquis on day 3 of warfarin start Could start warfarin 5 mg daily with close follow-up in anticoagulation clinic in [facility]

## 2019-10-31 NOTE — Telephone Encounter (Signed)
Spoke with patient and reviewed options for assistance with Eliquis. He does report that Dr. Rockey Situ mentioned another medication that may be cheaper he could use. So he wants me to check with him first about that and then if no other options he would get documents together for me to send in patient assistance application. Reviewed that provider will be back on Monday and hope to hear from him soon. He verbalized understanding with no further questions at this time.

## 2019-10-31 NOTE — Telephone Encounter (Signed)
Spoke with pharmacy and cost of xarelto would be $131 and currently he is paying $136 for Eliquis. So if he wants to go to Warfarin would he just start the day after his last dose of Eliquis? That way I can give him all information. I can do patient assistance but not sure if he would qualify.

## 2019-10-31 NOTE — Telephone Encounter (Signed)
As always with anyone who is unable to afford eliquis, We have them call her insurance company and check on the coverage of Xarelto Last option would be warfarin

## 2019-10-31 NOTE — Telephone Encounter (Signed)
Reviewed all options with rough costs and that pharmacy did mention he needs to meet deductible. Called [facility] and reviewed prescription and he would need to meet $6,550 out of pocket before price would reduce. He verbalized understanding and wants some time to think about it. He states that he will call back once he makes a decision. He verbalized understanding with no further questions at this time.

## 2019-12-10 ENCOUNTER — Other Ambulatory Visit: Payer: Self-pay | Admitting: Cardiovascular Disease

## 2019-12-10 NOTE — Telephone Encounter (Signed)
Refill Request.  

## 2019-12-10 NOTE — Telephone Encounter (Signed)
Pt's age 79, wt 113.4 kg, SCr 1.18, CrCl 78.75, last ov w/ TG 07/28/19.

## 2019-12-24 ENCOUNTER — Other Ambulatory Visit: Payer: Self-pay | Admitting: Family Medicine

## 2020-01-15 ENCOUNTER — Other Ambulatory Visit: Payer: Self-pay

## 2020-01-15 ENCOUNTER — Ambulatory Visit (INDEPENDENT_AMBULATORY_CARE_PROVIDER_SITE_OTHER): Payer: Medicare Other | Admitting: Family Medicine

## 2020-01-15 ENCOUNTER — Encounter: Payer: Self-pay | Admitting: Family Medicine

## 2020-01-15 VITALS — BP 120/80 | HR 76 | Temp 98.0°F | Ht 66.5 in | Wt 255.0 lb

## 2020-01-15 DIAGNOSIS — R102 Pelvic and perineal pain: Secondary | ICD-10-CM | POA: Diagnosis not present

## 2020-01-15 DIAGNOSIS — W010XXA Fall on same level from slipping, tripping and stumbling without subsequent striking against object, initial encounter: Secondary | ICD-10-CM | POA: Diagnosis not present

## 2020-01-15 DIAGNOSIS — W19XXXA Unspecified fall, initial encounter: Secondary | ICD-10-CM

## 2020-01-15 NOTE — Patient Instructions (Signed)
Tylenol extra strength - it is ok to take 1 tablet three times a day  It is ok to take ibuprofen (Advil) 3 tablets up 3 times a day

## 2020-01-15 NOTE — Progress Notes (Signed)
Hunter Young T. Zong Mcquarrie, MD, [place] at [facility] [place], [address]  Phone: [phone]  FAX: [phone]  Hunter Young - 79 y.o. male  MRN [id number]  Date of Birth: 20-Feb-1941  Date: 01/15/2020  PCP: Owens Loffler, MD  Referral: Owens Loffler, MD  Chief Complaint  Patient presents with  . Fall    on Monday  . Hip Pain    Left    This visit occurred during the SARS-CoV-2 public health emergency.  Safety protocols were in place, including screening questions prior to the visit, additional usage of staff PPE, and extensive cleaning of exam room while observing appropriate contact time as indicated for disinfecting solutions.   Subjective:   Hunter Young is a 79 y.o. very pleasant male patient with Body mass index is 40.54 kg/m. who presents with the following:  [name] is here to check in after recent fall.  Went fishing and slipped and fell on the dirt.   Landed on his slide and fished for about four or five hours.  After he got on the boat.  He was able to fish continuously for about 4 to 5 h, but when he got done fishing he did have quite a bit of pain in the posterior pelvic region.  He denies any back pain.  He denies any lateral hip pain.  He is not had any bruising or swelling.  He does not have any pain in the coccyx.  Over the last 4 days he has had some improvement of symptoms.    Review of Systems is noted in the HPI, as appropriate   Objective:   BP 120/80   Pulse 76   Temp 98 F (36.7 C) (Temporal)   Ht 5' 6.5" (1.689 m)   Wt 255 lb (115.7 kg) Comment: patient reported  SpO2 96%   BMI 40.54 kg/m   At the hip the patient has full range of motion in all directions.  Logroll is negative.  No tenderness at the trochanteric bursa.  With the hip flexed to 90 there is no pain with rotational movement.  Strength is 4+/5 in all directions.  On the posterior  aspect of the pelvis he does have some modest tenderness on the left side of the aspect inferior to the pelvic rim.  He also has some modest tenderness at the SI joint and the lateral sacrum.  Radiology: No results found.  Assessment and Plan:     ICD-10-CM   1. Pelvic pain  R10.2   2. Fall, initial encounter  W19.Hunter Young    I reassured him and his wife.  Trauma to posterior pelvis.  I don't see in any way how this could potentially be a fracture.  He is going to have soft tissue contusion as well as some bone contusion, but this should improve on its own.  Estimated length of time 3 to 4 weeks.  Keep moving, do not sit in a chair bed all day.  His wife will encourage him to move.  No orders of the defined types were placed in this encounter.  There are no discontinued medications. No orders of the defined types were placed in this encounter.   Follow-up: No follow-ups on file.  Signed,  Maud Deed. [name], MD   Outpatient Encounter Medications as of 01/15/2020  Medication Sig  . atorvastatin (LIPITOR) 40 MG tablet TAKE 1 TABLET(40 MG) BY MOUTH DAILY  .  ELIQUIS 5 MG TABS tablet TAKE 1 TABLET(5 MG) BY MOUTH TWICE DAILY  . mirabegron ER (MYRBETRIQ) 50 MG TB24 tablet Take 1 tablet (50 mg total) by mouth daily.  . Multiple Vitamins-Minerals (PRESERVISION AREDS) TABS Take by mouth. Take 1 tablet by mouth twice daily  . nitroGLYCERIN (NITROSTAT) 0.4 MG SL tablet Place 1 tablet (0.4 mg total) under the tongue every 5 (five) minutes as needed.   No facility-administered encounter medications on file as of 01/15/2020.

## 2020-02-17 ENCOUNTER — Other Ambulatory Visit: Payer: Self-pay

## 2020-02-17 ENCOUNTER — Ambulatory Visit: Payer: Medicare Other

## 2020-02-17 DIAGNOSIS — N3281 Overactive bladder: Secondary | ICD-10-CM

## 2020-02-17 DIAGNOSIS — E785 Hyperlipidemia, unspecified: Secondary | ICD-10-CM

## 2020-02-17 NOTE — Chronic Care Management (AMB) (Signed)
Chronic Care Management Pharmacy  Name: Hunter Young  MRN: [id number] DOB: Jan 19, 1941  Chief Complaint/ HPI  Hunter Young,  79 y.o., male presents for their Follow-Up CCM visit with the clinical pharmacist via telephone.  PCP : Owens Loffler, MD  Their chronic conditions include: hypertension, hyperlipidemia, CAD, allergic rhinitis, GERD, osteoarthritis, BPH, OAB, AFIB with history of stroke   Patient concerns:  Unresolved frequent urination overnight, was referred to new urology clinic but did not follow through with appointment. Has not tried saw palmetto.   Office Visits:  01/15/20: [facility] - patient fell slipped on dirt while fishing, pelvic pain, should keep moving to help with pain  07/23/19: [facility] - hospital f/u, counseled to dc alcohol, recommend moving up cardio appt   10/23/18: [facility] - weight loss, Flomax for increased urination  Consult Visit:  07/28/19: Cardiology - AFIB, rate control, tolerating Eliquis, CAD, unable to tolerate BB due to low BP, LDL above goal recommend weight loss, atypical chest pain, recommend omeprazole 20 mg daily OTC  07/17/19: ED - acute pancreatitis likely alcohol induced  07/03/19: ED - chest pain, pt left before evaluation  05/30/19: BPH - tried finasteride for 6 months, no difference in symptoms of weak stream, nocturia 8-9x, recommend sleep study, start Myrbetriq 50 mg samples - 1 daily   11/22/18: BPH - start finasteride   10/15/18: Cardiology - permanent AFIB/CAD, continue current meds  Allergies  Allergen Reactions   Oxycodone-Aspirin Nausea And Vomiting   Medications: Outpatient Encounter Medications as of 02/17/2020  Medication Sig   atorvastatin (LIPITOR) 40 MG tablet TAKE 1 TABLET(40 MG) BY MOUTH DAILY   ELIQUIS 5 MG TABS tablet TAKE 1 TABLET(5 MG) BY MOUTH TWICE DAILY   mirabegron ER (MYRBETRIQ) 50 MG TB24 tablet Take 1 tablet (50 mg total) by mouth daily.   Multiple Vitamins-Minerals (PRESERVISION AREDS) TABS Take by  mouth. Take 1 tablet by mouth twice daily   nitroGLYCERIN (NITROSTAT) 0.4 MG SL tablet Place 1 tablet (0.4 mg total) under the tongue every 5 (five) minutes as needed.   No facility-administered encounter medications on file as of 02/17/2020.   Current Diagnosis/Assessment: Goals     DIET - EAT MORE FRUITS AND VEGETABLES     Starting 07/31/2017, I will continue to eat 4-5 servings of fresh fruits and vegetables.      Eat more fruits and vegetables     Starting 07/26/2016, I will continue to eat 3-4 servings of fresh fruits and vegetables.      Pharmacy Care Plan     CARE PLAN ENTRY  Current Barriers:   Chronic Disease Management support, education, and care coordination needs related to Hyperlipidemia and Overactive Bladder   Hyperlipidemia Lab Results  Component Value Date/Time   LDLCALC 82 10/18/2018 08:01 AM   LDLCALC 80 06/11/2014 07:59 AM   Pharmacist Clinical Goal(s): o Over the next 3 months, patient will work with PharmD and providers to achieve LDL goal < 70  Current regimen:  o Atorvastatin 40 mg - 1 tablet daily  Interventions: o Discussed weight loss goals: - Current weight: 255 lbs. - Goal weight: 220 lbs.  Patient self care activities - Over the next 3 months, patient will:  Slowly increase exercise with goal of 30 minutes, 5 days per week  Incorporate a healthy diet high in vegetables, fruits and whole grains with low-fat dairy products, chicken, fish, legumes, non-tropical vegetable oils and nuts. Limit intake of sweets, sugar-sweetened beverages and red meats.  Overactive Bladder  Pharmacist Clinical Goal(s) o Over the next 3 months, patient will work with PharmD and providers to improve symptoms of frequent urination  Current regimen:  o Myrbetriq 50 mg - 1 daily (started May 30, 2019)  Interventions: o Reviewed medication history and consulted Dr. Lorelei Pont for further management - recommended d/c Myrbetriq and try oxybutynin again o Discussed  trying OTC saw palmetto   Patient self care activities - Over the next 3 months patient will: o Continue to avoid alcohol and limit fluid intake after dinner  o Try over the counter saw palmetto o Follow up call with pharmacist team in 2-3 weeks   Medication management - Cost  Mailed application for patient assistance with Eliquis  Please bring application to Dr. Donivan Scull office once completed  Vaccines:  Remain up to date on vaccinations. Recommend tetanus (Tdap) and shingles 2-dose vaccine series (Shingrix) from local pharmacy.   Please see past updates related to this goal by clicking on the "Past Updates" button in the selected goal       Hypertension   CMP Latest Ref Rng & Units 07/23/2019 07/17/2019 07/03/2019  Glucose 70 - 99 mg/dL - 101(H) 108(H)  BUN 8 - 23 mg/dL - 22 19  Creatinine 0.61 - 1.24 mg/dL - 1.18 1.01  Sodium 135 - 145 mmol/L - 132(L) 137  Potassium 3.5 - 5.1 mmol/L - 4.6 4.6  Chloride 98 - 111 mmol/L - 100 104  CO2 22 - 32 mmol/L - 21(L) 25  Calcium 8.9 - 10.3 mg/dL - 9.1 9.7  Total Protein 6.0 - 8.3 g/dL 7.5 6.7 -  Total Bilirubin 0.2 - 1.2 mg/dL 0.5 0.7 -  Alkaline Phos 39 - 117 U/L 300(H) 140(H) -  AST 0 - 37 U/L 58(H) 61(H) -  ALT 0 - 53 U/L 98(H) 83(H) -   Office blood pressures are: BP Readings from Last 3 Encounters:  01/15/20 120/80  07/28/19 100/78  07/23/19 110/70   BP goal < 140/90 mmHg Patient has failed these meds in the past: metoprolol Patient checks BP at home infrequently Patient home BP readings are ranging: none reported   Patient is currently controlled on the following medications:   No pharmacotherapy  We discussed: Pt has arm cuff monitor but does not check frequently at home  No changes or concerns 02/17/20  Plan: Continue current medications  Hyperlipidemia/CAD   Lipid Panel     Component Value Date/Time   CHOL 142 10/18/2018 0801   CHOL 161 06/11/2014 0759   TRIG 83.0 10/18/2018 0801   HDL 44.30 10/18/2018  0801   HDL 68 06/11/2014 0759   CHOLHDL 3 10/18/2018 0801   VLDL 16.6 10/18/2018 0801   LDLCALC 82 10/18/2018 0801   LDLCALC 80 06/11/2014 0759   LABVLDL 13 06/11/2014 0759    The ASCVD Risk score (Goff DC Jr., et al., 2013) failed to calculate for the following reasons:   The 2013 ASCVD risk score is only valid for ages 47 to 10   The patient has a prior MI or stroke diagnosis   LDL goal < 70  Patient has failed these meds in past: none  Patient is currently uncontrolled on the following medications:   Atorvastatin 40 mg - 1 tablet daily  Nitroglycerin 0.4 mg SL - 1 tablet PRN (hasn't taken in a long time) : Weight goal: 220 lbs Exercise: limited by leg and back pain Diet: trying to lose weight but reports weight had been stable, continues to avoid bread/starchy foods  Update 02/17/20: Pt has gained 5 lbs. Reminded of weight loss goals. Encouraged exercise and healthy diet.   Plan: Continue current medications; Slowly increase exercise with goal of 30 minutes, 5 days per week. Incorporate a healthy diet high in vegetables, fruits and whole grains with low-fat dairy products, chicken, fish, legumes, non-tropical vegetable oils and nuts. Limit intake of sweets, sugar-sweetened beverages and red meats.  AFIB   CBC Latest Ref Rng & Units 07/17/2019 07/03/2019 10/18/2018  WBC 4.0 - 10.5 K/uL 13.1(H) 11.3(H) 6.9  Hemoglobin 13.0 - 17.0 g/dL 15.2 15.1 15.1  Hematocrit 39 - 52 % 47.0 45.8 46.8  Platelets 150 - 400 K/uL 196 200 176.0   CHADSVASc: 5 (HTN +1, stroke +2, age > 40 +2)  Patient has failed these meds in past: metoprolol Patient is currently controlled on the following medications:   Eliquis 5 mg - 1 tablet BID  We discussed: cost is concern and likely affecting adherence - pt often has > 5 day gap between refills, recommend using pillbox or adherence packaging. Pt did not receive PAP form for Eliquis. He would like me to mail form again.   Update 02/17/20: Pt has not filled  out PAP forms for Eliquis. Reports he continues taking BID.   Plan: Continue current medications; Patient to complete assistance form if needed for Eliquis cost.   Overactive Bladder/BPH   Patient has failed these meds in past: tamsulosin, finasteride 12 months +, oxybutynin (2012-2020), Vesicare Patient is currently uncontrolled on the following medications:   Myrbetriq 50 mg - 1 daily (started May 30, 2019)  Update 02/17/20: Frequent urination remains a concern. Pt has cut out of liquids except 2 glasses of water daily. He would like to stop Mybetriq due to lack of benefit. He is not seeing urology currently (had some difficulty scheduling appt).   Discussed trying OTC saw palmetto - call in 2-3 weeks to assess efficacy. Reviewed med history and pt has tried almost all prescription options. He benefited from oxybutynin for several years so we may consider resuming this. He was on oxybutynin IR 5 mg TID. Recommend oxybutynin 10 mg ER once daily. Risks likely to outweigh benefits, however, pt may be willing to try considering he is very bothered by nighttime awakenings.   Last Sunday: kept urination log -->  7 PM,  9 PM, 10:10 PM, 1 AM, 3:30 AM, 5 AM , 6:53 AM, 8:42 AM Monday --> 7 PM, 10 PM, 11 PM, 2 AM, 445 AM, 741AM, 38 AM Freuquency is main concerns, no difficulty with urgency, straining, or incomplete empyting  Plan: Continue current medications; Consult PCP for medication management - oxybutynin ER. Try saw palmetto OTC. F/u call in 2-3 weeks to assess.  Vaccines   Reviewed and discussed patient's vaccination history. Reports received both doses of the COVID-19 vaccine.   Immunization History  Administered Date(s) Administered   Fluad Quad(high Dose 65+) 01/08/2020   Hepatitis B 07/22/2008   Influenza Split 01/19/2011, 12/21/2011   Influenza Whole 01/10/2007, 12/25/2008   Influenza, High Dose Seasonal PF 01/10/2017   Influenza,inj,Quad PF,6+ Mos 12/16/2012, 01/28/2014,  02/11/2015, 11/25/2015, 04/03/2018   PFIZER SARS-COV-2 Vaccination 04/17/2019, 05/08/2019, 12/29/2019   Pneumococcal Conjugate-13 01/28/2014   Pneumococcal Polysaccharide-23 03/27/2004, 01/19/2011   Td 03/27/1998, 07/22/2008   Plan: Recommended tetanus and shingles vaccine.   Medication Management  OTCs: multivitamin - 1 daily (Centrum Silver) (PreserVision Areds 1 BID), Tylenol for headaches  Pharmacy/Benefits: [facility]  Adherence: pt reports taking Atorvastatin 40, Mybetriq 50 mg, Eliquis  5 mg, multivitamin, and Preservision BID as prescribed  CCM Follow Up:  6 months, telephone visit; CMA call in 2-3 weeks for nocturia assessment  [name], PharmD Clinical Pharmacist [facility] at [facility] [phone]

## 2020-02-18 NOTE — Progress Notes (Signed)
I have collaborated with the care management provider regarding care management and care coordination activities outlined in this encounter and have reviewed this encounter including documentation in the note and care plan. I am certifying that I agree with the content of this note and encounter as supervising physician.  

## 2020-02-18 NOTE — Patient Instructions (Signed)
Dear Hunter Young,  Below is a summary of the goals we discussed during our follow up appointment on February 18, 2020. Please contact me anytime with questions or concerns.   Visit Information  Goals Addressed            This Visit's Progress   . Pharmacy Care Plan       CARE PLAN ENTRY  Current Barriers:  . Chronic Disease Management support, education, and care coordination needs related to Hyperlipidemia and Overactive Bladder   Hyperlipidemia Lab Results  Component Value Date/Time   LDLCALC 82 10/18/2018 08:01 AM   LDLCALC 80 06/11/2014 07:59 AM .  Pharmacist Clinical Goal(s): o Over the next 3 months, patient will work with PharmD and providers to achieve LDL goal < 70 . Current regimen:  o Atorvastatin 40 mg - 1 tablet daily . Interventions: o Discussed weight loss goals: - Current weight: 255 lbs. - Goal weight: 220 lbs. . Patient self care activities - Over the next 3 months, patient will: . Slowly increase exercise with goal of 30 minutes, 5 days per week . Incorporate a healthy diet high in vegetables, fruits and whole grains with low-fat dairy products, chicken, fish, legumes, non-tropical vegetable oils and nuts. Limit intake of sweets, sugar-sweetened beverages and red meats.  Overactive Bladder . Pharmacist Clinical Goal(s) o Over the next 3 months, patient will work with PharmD and providers to improve symptoms of frequent urination . Current regimen:  o Myrbetriq 50 mg - 1 daily (started May 30, 2019) . Interventions: o Reviewed medication history and consulted Dr. Lorelei Pont for further management - recommended d/c Myrbetriq and try oxybutynin again o Discussed trying OTC saw palmetto  . Patient self care activities - Over the next 3 months patient will: o Continue to avoid alcohol and limit fluid intake after dinner  o Try over the counter saw palmetto o Follow up call with pharmacist team in 2-3 weeks   Medication management - Cost . Mailed  application for patient assistance with Eliquis . Please bring application to Dr. Donivan Scull office once completed  Vaccines: . Remain up to date on vaccinations. Recommend tetanus (Tdap) and shingles 2-dose vaccine series (Shingrix) from local pharmacy.   Please see past updates related to this goal by clicking on the "Past Updates" button in the selected goal        The patient verbalized understanding of instructions, educational materials, and care plan provided today and agreed to receive a mailed copy of patient instructions, educational materials, and care plan.   The pharmacy team will reach out to the patient again over the next 30 days.   [name], PharmD Clinical Pharmacist [facility] at [facility] [phone]   Basics of Medicine Management Taking your medicines correctly is an important part of managing or preventing medical problems. Make sure you know what disease or condition your medicine is treating, and how and when to take it. If you do not take your medicine correctly, it may not work well and may cause unpleasant side effects, including serious health problems. What should I do when I am taking medicines?   Read all the labels and inserts that come with your medicines. Review the information often.  Talk with your pharmacist if you get a refill and notice a change in the size, color, or shape of your medicines.  Know the potential side effects for each medicine that you take.  Try to get all your medicines from the same pharmacy.  The pharmacist will have all your information and will understand how your medicines will affect each other (interact).  Tell your health care provider about all your medicines, including over-the-counter medicines, vitamins, and herbal or dietary supplements. He or she will make sure that nothing will interact with any of your prescribed medicines. How can I take my medicines safely?  Take medicines only as told by  your health care provider. ? Do not take more of your medicine than instructed. ? Do not take anyone else's medicines. ? Do not share your medicines with others. ? Do not stop taking your medicines unless your health care provider tells you to do so. ? You may need to avoid alcohol or certain foods or liquids when taking certain medicines. Follow your health care provider's instructions.  Do not split, mash, or chew your medicines unless your health care provider tells you to do so. Tell your health care provider if you have trouble swallowing your medicines.  For liquid medicine, use the dosing container that was provided. How should I organize my medicines?  Know your medicines  Know what each of your medicines looks like. This includes size, color, and shape. Tell your health care provider if you are having trouble recognizing all the medicines that you are taking.  If you cannot tell your medicines apart because they look similar, keep them in original bottles.  If you cannot read the labels on the bottles, tell your pharmacist to put your medicines in containers with large print.  Review your medicines and your schedule with family members, a friend, or a caregiver. Use a pill organizer  Use a tool to organize your medicine schedule. Tools include a weekly pillbox, a written chart, a notebook, or a calendar.  Your tool should help you remember the following things about each medicine: ? The name of the medicine. ? The amount (dose) to take. ? The schedule. This is the day and time the medicine should be taken. ? The appearance. This includes color, shape, size, and stamp. ? How to take your medicines. This includes instructions to take them with food, without food, with fluids, or with other medicines.  Create reminders for taking your medicines. Use sticky notes, or alarms on your watch, mobile device, or phone calendar.  You may choose to use a more advanced management  system. These systems have storage, alarms, and visual and audio prompts.  Some medicines can be taken on an "as-needed" basis. These include medicines for nausea or pain. If you take an as-needed medicine, write down the name and dose, as well as the date and time that you took it. How should I plan for travel?  Take your pillbox, medicines, and organization system with you when traveling.  Have your medicines refilled before you travel. This will ensure that you do not run out of your medicines while you are away from home.  Always carry an updated list of your medicines with you. If there is an emergency, a first responder can quickly see what medicines you are taking.  Do not pack your medicines in checked luggage in case your luggage is lost or delayed.  If any of your medicines is considered a controlled substance, make sure you bring a letter from your health care provider with you. How should I store and discard my medicines? For safe storage:  Store medicines in a cool, dry area away from light, or as directed by your health care provider. Do not store medicines  in the bathroom. Heat and humidity will affect them.  Do not store your medicines with other chemicals, or with medicines for pets or other household members.  Keep medicines away from children and pets. Do not leave them on counters or bedside tables. Store them in high cabinets or on high shelves. For safe disposal:  Check expiration dates regularly. Do not take expired medicines. Discard medicines that are older than the expiration date.  Learn a safe way to dispose of your medicines. You may: ? Use a local government, hospital, or pharmacy medicine-take-back program. ? Mix the medicines with inedible substances, put them in a sealed bag or empty container, and throw them in the trash. What should I remember?  Tell your health care provider if you: ? Experience side effects. ? Have new symptoms. ? Have other  concerns about taking your medicines.  Review your medicines regularly with your health care provider. Other medicines, diet, medical conditions, weight changes, and daily habits can all affect how medicines work. Ask if you need to continue taking each medicine, and discuss how well each one is working.  Refill your medicines early to avoid running out of them.  In case of an accidental overdose, call your local [place] at [phone] or visit your local emergency department immediately. This is important. Summary  Taking your medicines correctly is an important part of managing or preventing medical problems.  You need to make sure that you understand what you are taking a medicine for, as well as how and when you need to take it.  Know your medicines and use a pill organizer to help you take your medicines correctly.  In case of an accidental overdose, call your local [facility] at [phone] or visit your local emergency department immediately. This is important. This information is not intended to replace advice given to you by your health care provider. Make sure you discuss any questions you have with your health care provider. Document Revised: 03/08/2017 Document Reviewed: 03/08/2017 Elsevier Patient Education  2020 [employer].

## 2020-02-24 ENCOUNTER — Telehealth: Payer: Self-pay | Admitting: Family Medicine

## 2020-02-24 MED ORDER — OXYBUTYNIN CHLORIDE ER 10 MG PO TB24: 10.0000 mg | ORAL_TABLET | Freq: Every day | ORAL | 2 refills | Status: DC

## 2020-02-24 NOTE — Telephone Encounter (Signed)
Trial of ditropan XR and d/c myrbetriq

## 2020-03-15 ENCOUNTER — Telehealth: Payer: Self-pay

## 2020-03-15 NOTE — Chronic Care Management (AMB) (Signed)
    Chronic Care Management Pharmacy Assistant   Name: Hunter Young  MRN: [id number] DOB: [birth year]  Reason for Encounter: Disease State and Medication Review  Patient Questions:  1.  Have you seen any other providers since your last visit? No  2.  Any changes in your medicines or health? Yes  02/24/20 Dr. Lorelei Pont discontinued Myrbetriq and started patient on trial of ditropan XR  PCP : Owens Loffler, MD  Allergies:   Allergies  Allergen Reactions  . Oxycodone-Aspirin Nausea And Vomiting    Medications: Outpatient Encounter Medications as of 03/15/2020  Medication Sig  . atorvastatin (LIPITOR) 40 MG tablet TAKE 1 TABLET(40 MG) BY MOUTH DAILY  . ELIQUIS 5 MG TABS tablet TAKE 1 TABLET(5 MG) BY MOUTH TWICE DAILY  . Multiple Vitamins-Minerals (PRESERVISION AREDS) TABS Take by mouth. Take 1 tablet by mouth twice daily  . nitroGLYCERIN (NITROSTAT) 0.4 MG SL tablet Place 1 tablet (0.4 mg total) under the tongue every 5 (five) minutes as needed. (Patient not taking: Reported on 02/18/2020)  . oxybutynin (DITROPAN-XL) 10 MG 24 hr tablet Take 1 tablet (10 mg total) by mouth at bedtime.   No facility-administered encounter medications on file as of 03/15/2020.    Current Diagnosis: Patient Active Problem List   Diagnosis Date Noted  . Occipital stroke ([place]) 04/02/2018  . OAB (overactive bladder) 03/08/2018  . Stenosis of right carotid artery 01/29/2014  . Severe obesity (BMI >= 40) ([name]) 04/02/2013  . CERVICAL DISORDER, NOS 10/14/2009  . GERD 12/06/2007  . BPH (benign prostatic hypertrophy) with urinary obstruction 06/25/2007  . OSTEOARTHRITIS 06/25/2007  . Cardiomegaly 05/30/2007  . Allergic rhinitis 04/22/2007  . VENOUS INSUFFICIENCY 12/04/2006  . Hyperlipidemia LDL goal <70 12/03/2006  . Essential hypertension 12/03/2006  . Coronary artery disease involving native coronary artery without angina pectoris 12/03/2006  . DIVERTICULOSIS OF COLON 05/20/2001   Since last  visit with CPP, Dr. Lorelei Pont discontinued Myrbetriq and started patient on a trial of Ditropan XR due to Myrbetriq not work. Patient states he has not started the ditropan XR. [name] started patient on Saw Palmetto OTC for night time urination. He feels like this may have helped some but not much. States he is only taking 1 Eliquis a day and sometimes will take 2 due to price .  The patient has not had an ED visit since their last CPP follow up.The patient's current Chronic and PDC medications are: Atorvastatin 40mg  1 tablet daily, Eliquis 1 tablet twice daily. They currently do not have a greater than 5 day gap between last fill.The patient has had the following problems with their health: notes he fell 2 times recently. States he lost his balance. The patient has not had any problems with their pharmacy. The patient has not had any side effects with their medicines. The patient would not like the CPP to call them. The patient has no recommendations for improvements in managing care. The patient has misc. comments: would like something to help with his balance. Also having pain around waist. Notes he has fallen 2x recently. He is also interested in [place] filling out a patient assistance form for his Eliqus. He is aware I will fill this out and mail it to him. He knows this form will need to be taken to Dr. Donivan Scull office for signature.    Follow-Up:  Patient Assistance Coordination and Pharmacist Review   [name], CPP notified  Margaretmary Dys, [place] Pharmacy Assistant [phone]

## 2020-03-25 ENCOUNTER — Telehealth: Payer: Self-pay

## 2020-03-25 NOTE — Chronic Care Management (AMB) (Addendum)
    Chronic Care Management Pharmacy Assistant   Name: Hunter Young  MRN: [id number] DOB: 09/18/1940  Reason for Encounter: Medication Review- Adherence review  Reviewed chart and adherence measures. Per [employer], medication adherence for Cholesterol 90-99% med compliance.  PCP : Hannah Beat, MD  Allergies:   Allergies  Allergen Reactions   Oxycodone-Aspirin Nausea And Vomiting    Medications: Outpatient Encounter Medications as of 03/25/2020  Medication Sig   atorvastatin (LIPITOR) 40 MG tablet TAKE 1 TABLET(40 MG) BY MOUTH DAILY   ELIQUIS 5 MG TABS tablet TAKE 1 TABLET(5 MG) BY MOUTH TWICE DAILY   Multiple Vitamins-Minerals (PRESERVISION AREDS) TABS Take by mouth. Take 1 tablet by mouth twice daily   nitroGLYCERIN (NITROSTAT) 0.4 MG SL tablet Place 1 tablet (0.4 mg total) under the tongue every 5 (five) minutes as needed. (Patient not taking: Reported on 02/18/2020)   oxybutynin (DITROPAN-XL) 10 MG 24 hr tablet Take 1 tablet (10 mg total) by mouth at bedtime.   No facility-administered encounter medications on file as of 03/25/2020.    Current Diagnosis: Patient Active Problem List   Diagnosis Date Noted   Occipital stroke ([facility]) 04/02/2018   OAB (overactive bladder) 03/08/2018   Stenosis of right carotid artery 01/29/2014   Severe obesity (BMI >= 40) ([facility]) 04/02/2013   CERVICAL DISORDER, NOS 10/14/2009   GERD 12/06/2007   BPH (benign prostatic hypertrophy) with urinary obstruction 06/25/2007   OSTEOARTHRITIS 06/25/2007   Cardiomegaly 05/30/2007   Allergic rhinitis 04/22/2007   VENOUS INSUFFICIENCY 12/04/2006   Hyperlipidemia LDL goal <70 12/03/2006   Essential hypertension 12/03/2006   Coronary artery disease involving native coronary artery without angina pectoris 12/03/2006   DIVERTICULOSIS OF COLON 05/20/2001    Follow-Up:  Pharmacist Review   Jomarie Longs, [facility] Clinical Pharmacy Assistant [phone]  Reviewed chart and insurance data for  medication adherence. Patient does not have any gaps in adherence and is not in danger of failing any Medicare adherence measures. No further action required.  Phil Dopp, PharmD Clinical Pharmacist [facility] at [facility] [phone]

## 2020-04-01 ENCOUNTER — Telehealth: Payer: Self-pay

## 2020-04-01 NOTE — Telephone Encounter (Signed)
Patient assistance application for Eliquis was mailed to patient's home for completion. Patient informed to complete highlighted information. Provide proof of income and proof of out of pocket expenses (request from his pharmacy) and return to Dr. Windell Hummingbird office once completed.  Hunter Young, PharmD Clinical Pharmacist [facility] at [facility] [phone]

## 2020-06-03 ENCOUNTER — Other Ambulatory Visit: Payer: Self-pay | Admitting: Family Medicine

## 2020-06-11 NOTE — Telephone Encounter (Signed)
Referral still unsigned - please sign.

## 2020-06-12 NOTE — Telephone Encounter (Signed)
This consultation was signed 6:14 PM  06/12/20

## 2020-06-12 NOTE — Telephone Encounter (Signed)
I have personally reviewed this encounter including the documentation in this note and have collaborated with the care management provider regarding care management and care coordination activities to include development and update of the comprehensive care plan. I am certifying that I agree with the content of this note and encounter as supervising physician.  I appreciate assistance.

## 2020-06-15 ENCOUNTER — Telehealth: Payer: Self-pay

## 2020-06-15 NOTE — Chronic Care Management (AMB) (Addendum)
Chronic Care Management Pharmacy Assistant   Name: Hunter Young  MRN: [id number] DOB: Mar 05, 1941  Reason for Encounter: Disease State- Hypertension   Conditions to be addressed/monitored: HTN  Recent office visits:  No office visits since last CCM contact  Recent consult visits:  No office visits since last CCM contact  Hospital visits:  None in previous 6 months  Medications: Outpatient Encounter Medications as of 06/15/2020  Medication Sig   atorvastatin (LIPITOR) 40 MG tablet TAKE 1 TABLET(40 MG) BY MOUTH DAILY   ELIQUIS 5 MG TABS tablet TAKE 1 TABLET(5 MG) BY MOUTH TWICE DAILY   Multiple Vitamins-Minerals (PRESERVISION AREDS) TABS Take by mouth. Take 1 tablet by mouth twice daily   nitroGLYCERIN (NITROSTAT) 0.4 MG SL tablet Place 1 tablet (0.4 mg total) under the tongue every 5 (five) minutes as needed. (Patient not taking: Reported on 02/18/2020)   oxybutynin (DITROPAN-XL) 10 MG 24 hr tablet TAKE 1 TABLET(10 MG) BY MOUTH AT BEDTIME   No facility-administered encounter medications on file as of 06/15/2020.    Recent Office Vitals: BP Readings from Last 3 Encounters:  01/15/20 120/80  07/28/19 100/78  07/23/19 110/70   Pulse Readings from Last 3 Encounters:  01/15/20 76  07/28/19 88  07/23/19 92    Wt Readings from Last 3 Encounters:  01/15/20 255 lb (115.7 kg)  07/28/19 250 lb (113.4 kg)  07/23/19 250 lb 4 oz (113.5 kg)     Kidney Function Lab Results  Component Value Date/Time   CREATININE 1.18 07/17/2019 04:01 PM   CREATININE 1.01 07/03/2019 12:59 PM   GFR 69.44 10/18/2018 08:01 AM   GFRNONAA 58 (L) 07/17/2019 04:01 PM   GFRAA >60 07/17/2019 04:01 PM    BMP Latest Ref Rng & Units 07/17/2019 07/03/2019 10/18/2018  Glucose 70 - 99 mg/dL 101(H) 108(H) 145(H)  BUN 8 - 23 mg/dL 22 19 23   Creatinine 0.61 - 1.24 mg/dL 1.18 1.01 1.03  Sodium 135 - 145 mmol/L 132(L) 137 140  Potassium 3.5 - 5.1 mmol/L 4.6 4.6 4.1  Chloride 98 - 111 mmol/L 100 104 105  CO2  22 - 32 mmol/L 21(L) 25 27  Calcium 8.9 - 10.3 mg/dL 9.1 9.7 9.4   Hypertension  Current antihypertensive regimen:  No pharmacotherapy     How often are you checking your Blood Pressure? Patient not checking. Last several readings at doctors office were acceptable.   Current home BP readings: Not checking  Wrist or arm cuff: N/A Caffeine intake: 1 cup of coffee in the morning Salt intake: Limits salt intake OTC medications including pseudoephedrine or NSAIDs? No  Any readings above 180/120? Unknown If yes any symptoms of hypertensive emergency? patient denies any symptoms of high blood pressure   What recent interventions/DTPs have been made by any provider to improve Blood Pressure control since last CPP Visit: No  Any recent hospitalizations or ED visits since last visit with CPP? No  What diet changes have been made to improve Blood Pressure Control?  No changes in diet  What exercise is being done to improve your Blood Pressure Control?  Patient has not increased exercise, just active.   Adherence Review: Is the patient currently on ACE/ARB medication? No Does the patient have >5 day gap between last estimated fill dates? CPP to review   Star Rating Drugs:  Medication:  Last Fill: Day Supply Atorvastatin 40 mg 03/29/20  90  Inquired about Eliquis application with patient. He states he can not recall if he received  it in the mail. Discussed process with patient and explained he will need to request an out of pocket expense report from his pharmacy. He is going to request this from [facility] and asked that we mail him another copy of the application. He notes the Eliquis is about $100 a month.   Follow-Up:  Patient Assistance Coordination and Pharmacist Review  [name], CPP notified  Margaretmary Dys, [place] Assistant [phone]  I have reviewed the care management and care coordination activities outlined in this encounter and I am  certifying that I agree with the content of this note. Asked Ria Comment to send PAP to front desk for print/mail.   [name], PharmD Clinical Pharmacist [facility] at [facility] [phone]

## 2020-06-18 NOTE — Progress Notes (Signed)
Patient assistance application has been uploaded to [place] [facility] to mail to patient.   Follow-Up:  Patient Assistance Coordination and Pharmacist Review  [name], CPP notified  Margaretmary Dys, [place] [phone]  Total time spent for month:  CPA+CPP= 40

## 2020-07-20 ENCOUNTER — Other Ambulatory Visit: Payer: Self-pay | Admitting: Cardiovascular Disease

## 2020-07-20 NOTE — Telephone Encounter (Signed)
Refill Request.  

## 2020-07-20 NOTE — Telephone Encounter (Signed)
Prescription refill request for Eliquis received. Indication: Atrial Fib Last office visit: 07/28/19 Scr: 1.18 on 07/17/19 Age: 80 Weight: 113 kg  Based on above findings Eliquis 5mg  twice daily is the appropriate dose.  Pt is due for lab work.  Has appt next week with Dr Rockey Situ.  Note entered in appt notes to order CBC and BMP at that visit.  Refill approved.

## 2020-07-26 NOTE — Progress Notes (Signed)
Cardiology Office Note  Date:  07/28/2020   ID:  JAH ALARID, DOB 12/16/1940, MRN [id number]  PCP:  Owens Loffler, MD   Chief Complaint  Patient presents with  . 12 month follow up     "doing well." Medication is reviewed by the patient verbally.    HPI:  [name] is a very pleasant 80 year old gentleman with a history of  Stroke Permanent Atrial fibrillation CAD  stenting to his left circumflex, last catheterization in 2007 showing 40% proximal LAD disease, 70-80% mid RCA disease, nondominant vessel.  <39% b/l carotid disease in 2019 Periodic ETOH abuse He presents for routine followup of his coronary artery disease, atrial fibrillation  In follow-up today reports he has been gaining significant weight Weight 255 to 262 pounds in 6 months Poor diet BP stable  "bad bladder" Nocturia, 4 times a night for quite some time, interrupting his sleep Has tried saw palmetto, Myrbetriq, no improvement in symptoms "I cut out booze" quite a while ago  No recent episodes of chest pain/angina No regular exercise or walking program Legs weak Denies any tachypalpitations, in general is asymptomatic from his atrial fibrillation  No recent trips to the emergency room  EKG personally reviewed by myself on todays visit Shows atrial fibrillation rate 71 bpm PVCs   Other past medical history reviewed In the Er 07/17/2019, chest pain  5 weeks he has been experiencing intermittent discomfort in his left-sided chest and abdomen.  Troponin neg x 2 RUQ u/s normal  Other past medical history reviewed Echocardiogram 10/2017  reviewed mildly depressed ejection fraction 45 to 50%  moderately dilated left atrium  Seen in the office with Dr. Edilia Bo 10/10/2017 Had stroke type symptoms  speech, left hand and arm  MRI of the brain (10/17/17)  showed an acute infarction in the right occipital lobe Atrial fib noted on echo 11/01/2017 Started on eliquis 11/01/2017   PMH:   has a past medical  history of ALLERGIC RHINITIS (04/22/2007), BENIGN PROSTATIC HYPERTROPHY, WITH URINARY OBSTRUCTION (06/25/2007), Cardiomegaly (05/30/2007), Carotid stenosis (01/29/2014), CERVICAL DISORDER, NOS (10/14/2009), CORONARY ARTERY DISEASE (12/03/2006), DIVERTICULOSIS OF COLON (05/20/2001), Dyshidrosis (06/25/2007), GERD (12/06/2007), HYPERLIPIDEMIA (12/03/2006), INTERNAL HEMORRHOIDS (05/20/2001), OBESITY, MODERATE (12/05/2008), OSTEOARTHRITIS (06/25/2007), and VENOUS INSUFFICIENCY (12/04/2006).  PSH:    Past Surgical History:  Procedure Laterality Date  . ANGIOPLASTY     2 stents  . FOOT SURGERY    . JOINT REPLACEMENT     bilat knee  . TONSILLECTOMY      Current Outpatient Medications  Medication Sig Dispense Refill  . atorvastatin (LIPITOR) 40 MG tablet TAKE 1 TABLET(40 MG) BY MOUTH DAILY 90 tablet 3  . ELIQUIS 5 MG TABS tablet TAKE 1 TABLET(5 MG) BY MOUTH TWICE DAILY 180 tablet 1  . Multiple Vitamins-Minerals (PRESERVISION AREDS) TABS Take by mouth. Take 1 tablet by mouth twice daily    . nitroGLYCERIN (NITROSTAT) 0.4 MG SL tablet Place 1 tablet (0.4 mg total) under the tongue every 5 (five) minutes as needed. 25 tablet 6  . oxybutynin (DITROPAN-XL) 10 MG 24 hr tablet TAKE 1 TABLET(10 MG) BY MOUTH AT BEDTIME (Patient not taking: Reported on 07/28/2020) 30 tablet 2   No current facility-administered medications for this visit.    Allergies:   Oxycodone-aspirin   Social History:  The patient  reports that he quit smoking about 39 years ago. He has never used smokeless tobacco. He reports current alcohol use. He reports that he does not use drugs.   Family History:   family  history includes Colon cancer (age of onset: 54) in his mother.    Review of Systems: Review of Systems  Constitutional: Negative.   HENT: Negative.   Respiratory: Negative.   Cardiovascular: Negative.   Gastrointestinal: Negative.   Musculoskeletal: Negative.   Neurological: Negative.   Psychiatric/Behavioral: Negative.   All other  systems reviewed and are negative.   PHYSICAL EXAM: VS:  BP 122/60 (BP Location: Left Arm, Patient Position: Sitting, Cuff Size: Large)   Pulse 71   Ht 5' 7.5" (1.715 m)   Wt 262 lb 6 oz (119 kg)   SpO2 98%   BMI 40.49 kg/m  , BMI Body mass index is 40.49 kg/m. Constitutional:  oriented to person, place, and time. No distress.  HENT:  Head: Grossly normal Eyes:  no discharge. No scleral icterus.  Neck: No JVD, no carotid bruits  Cardiovascular: Irregularly irregular no murmurs appreciated Pulmonary/Chest: Clear to auscultation bilaterally, no wheezes or rails Abdominal: Soft.  no distension.  no tenderness.  Musculoskeletal: Normal range of motion Neurological:  normal muscle tone. Coordination normal. No atrophy Skin: Skin warm and dry Psychiatric: normal affect, pleasant   Recent Labs: No results found for requested labs within last 8760 hours.    Lipid Panel Lab Results  Component Value Date   CHOL 142 10/18/2018   HDL 44.30 10/18/2018   LDLCALC 82 10/18/2018   TRIG 83.0 10/18/2018    Wt Readings from Last 3 Encounters:  07/28/20 262 lb 6 oz (119 kg)  01/15/20 255 lb (115.7 kg)  07/28/19 250 lb (113.4 kg)     ASSESSMENT AND PLAN:  Persistent atrial fibrillation Tolerating Eliquis twice daily Previously unable to tolerate beta-blocker secondary to low blood pressure, rate adequate without rate control, asymptomatic  Hyperlipidemia LDL goal <70 - Plan: EKG 12-Lead LDL above goal, recommended weight loss, walking program May need to add Zetia  Essential hypertension - Plan: EKG 12-Lead Currently not on any medications.  Blood pressure low  Coronary artery disease involving native coronary artery of native heart without angina pectoris -  No recent episodes of chest pain  Stenosis of right carotid artery - Plan: EKG 12-Lead 1-39% stenosis bilaterally August 2019 Continue Lipitor Medical management at this time  Severe obesity (BMI >= 40) ([place]) - Plan:  EKG 12-Lead Long discussion that we need to change his diet, weight continues to trend upwards, worse since he has retired We have encouraged continued exercise, careful diet management in an effort to lose weight.    Total encounter time more than 25 minutes  Greater than 50% was spent in counseling and coordination of care with the patient    No orders of the defined types were placed in this encounter.    Signed, Esmond Plants, M.D., Ph.D. 07/28/2020  [facility] [place], [place] [phone]

## 2020-07-28 ENCOUNTER — Other Ambulatory Visit: Payer: Self-pay

## 2020-07-28 ENCOUNTER — Encounter: Payer: Self-pay | Admitting: Cardiovascular Disease

## 2020-07-28 ENCOUNTER — Ambulatory Visit: Payer: Medicare Other | Admitting: Cardiovascular Disease

## 2020-07-28 VITALS — BP 122/60 | HR 71 | Ht 67.5 in | Wt 262.4 lb

## 2020-07-28 DIAGNOSIS — I739 Peripheral vascular disease, unspecified: Secondary | ICD-10-CM

## 2020-07-28 DIAGNOSIS — I6310 Cerebral infarction due to embolism of unspecified precerebral artery: Secondary | ICD-10-CM

## 2020-07-28 DIAGNOSIS — I25118 Atherosclerotic heart disease of native coronary artery with other forms of angina pectoris: Secondary | ICD-10-CM

## 2020-07-28 DIAGNOSIS — I10 Essential (primary) hypertension: Secondary | ICD-10-CM | POA: Diagnosis not present

## 2020-07-28 DIAGNOSIS — E785 Hyperlipidemia, unspecified: Secondary | ICD-10-CM | POA: Diagnosis not present

## 2020-07-28 DIAGNOSIS — I4819 Other persistent atrial fibrillation: Secondary | ICD-10-CM

## 2020-07-28 NOTE — Patient Instructions (Signed)
Urology: talk to them about: Flomax/tamsulosin, terazosin, proscar   Medication Instructions:  No changes  If you need a refill on your cardiac medications before your next appointment, please call your pharmacy.    Lab work: No new labs needed   If you have labs (blood work) drawn today and your tests are completely normal, you will receive your results only by: Marland Kitchen MyChart Message (if you have MyChart) OR . A paper copy in the mail If you have any lab test that is abnormal or we need to change your treatment, we will call you to review the results.   Testing/Procedures: No new testing needed   Follow-Up: At [facility], you and your health needs are our priority.  As part of our continuing mission to provide you with exceptional heart care, we have created designated Provider Care Teams.  These Care Teams include your primary Cardiologist (physician) and Advanced Practice Providers (APPs -  Physician Assistants and Nurse Practitioners) who all work together to provide you with the care you need, when you need it.  . You will need a follow up appointment in 12 months  . Providers on your designated Care Team:   . Murray Hodgkins, NP . Christell Faith, PA-C . Marrianne Mood, PA-C  Any Other Special Instructions Will Be Listed Below (If Applicable).  COVID-19 Vaccine Information can be found at: [email or web address].uk For questions related to vaccine distribution or appointments, please email [email or web address] .[email or web address] or call 6265624457.

## 2020-08-05 ENCOUNTER — Other Ambulatory Visit: Payer: Self-pay | Admitting: *Deleted

## 2020-08-05 DIAGNOSIS — R351 Nocturia: Secondary | ICD-10-CM

## 2020-08-05 DIAGNOSIS — N401 Enlarged prostate with lower urinary tract symptoms: Secondary | ICD-10-CM

## 2020-08-06 ENCOUNTER — Ambulatory Visit: Payer: Medicare Other | Admitting: Urology

## 2020-08-06 ENCOUNTER — Encounter: Payer: Self-pay | Admitting: Urology

## 2020-08-06 ENCOUNTER — Other Ambulatory Visit: Payer: Self-pay

## 2020-08-06 ENCOUNTER — Other Ambulatory Visit
Admission: RE | Admit: 2020-08-06 | Discharge: 2020-08-06 | Disposition: A | Discharge status: Home / Self Care | Payer: Medicare Other | Attending: Urology | Admitting: Urology

## 2020-08-06 VITALS — BP 138/78 | HR 71 | Ht 67.0 in | Wt 262.0 lb

## 2020-08-06 DIAGNOSIS — R351 Nocturia: Secondary | ICD-10-CM | POA: Diagnosis not present

## 2020-08-06 DIAGNOSIS — N401 Enlarged prostate with lower urinary tract symptoms: Secondary | ICD-10-CM | POA: Diagnosis not present

## 2020-08-06 LAB — URINALYSIS, COMPLETE (UACMP) WITH MICROSCOPIC
Bilirubin Urine: NEGATIVE
Glucose, UA: NEGATIVE mg/dL
Hgb urine dipstick: NEGATIVE
Ketones, ur: NEGATIVE mg/dL
Leukocytes,Ua: NEGATIVE
Nitrite: NEGATIVE
Protein, ur: NEGATIVE mg/dL
Specific Gravity, Urine: 1.025 (ref 1.005–1.030)
pH: 5.5 (ref 5.0–8.0)

## 2020-08-06 LAB — BLADDER SCAN AMB NON-IMAGING

## 2020-08-06 MED ORDER — TAMSULOSIN HCL 0.4 MG PO CAPS: 0.4000 mg | ORAL_CAPSULE | Freq: Every day | ORAL | 11 refills | Status: DC

## 2020-08-06 MED ORDER — GEMTESA 75 MG PO TABS
75.0000 mg | ORAL_TABLET | Freq: Every day | ORAL | 0 refills | Status: DC
Start: 2020-08-06 — End: 2020-09-03

## 2020-08-06 NOTE — Progress Notes (Signed)
08/06/2020 9:45 AM   Hunter Young June 14, 1940 [id number]  Referring provider: Owens Loffler, MD [address] [place],  [place] [address]  Chief Complaint  Patient presents with  . Follow-up    OAB    HPI: 80 year old male with personal history of urinary frequency who returns today for follow-up.  He was last seen about a year ago.  At that time, he started on Myrbetriq 50 mg once daily.  He found this to be effective and the prescription was filled.  He later then said that it was not actually helping him.  He is also tried saw palmetto which was not effective.  He was on finasteride for 6 months which also did not improve his urinary symptoms.  In the interim, it looks like his primary care started him on oxybutynin 10 mg XL back in March 2022.  He tried this for a month or 2 and this was also not effective.  He is no longer taking this.  He reports today that he has a same exact symptoms that he is having less several years.  These include daytime urgency when he stands up but with less frequency.  At nighttime, he continues to get up about 5 times especially in the early morning hours.  Cutting back on alcohol has not helped.  He is very frustrated today.  He has minimal obstructive symptoms.  No dysuria or gross hematuria.  He was referred but never pursued a sleep study.  He does not remember having this conversation at each of our visits.  He does understand how this relates to his urinary symptoms.  His most recent PSA from 07/2017 was 0.39. Previous PSA 0.37 from 01/2014.    IPSS    Row Name 08/06/20 0900         International Prostate Symptom Score   How often have you had the sensation of not emptying your bladder? Less than half the time     How often have you had to urinate less than every two hours? More than half the time     How often have you found you stopped and started again several times when you urinated? About half the time     How often have you  found it difficult to postpone urination? About half the time     How often have you had a weak urinary stream? More than half the time     How often have you had to strain to start urination? Not at All     How many times did you typically get up at night to urinate? 5 Times     Total IPSS Score 21           Quality of Life due to urinary symptoms   If you were to spend the rest of your life with your urinary condition just the way it is now how would you feel about that? Unhappy            Score:  1-7 Mild 8-19 Moderate 20-35 Severe    PMH: Past Medical History:  Diagnosis Date  . ALLERGIC RHINITIS 04/22/2007  . BENIGN PROSTATIC HYPERTROPHY, WITH URINARY OBSTRUCTION 06/25/2007  . Cardiomegaly 05/30/2007  . Carotid stenosis 01/29/2014  . CERVICAL DISORDER, NOS 10/14/2009  . CORONARY ARTERY DISEASE 12/03/2006  . DIVERTICULOSIS OF COLON 05/20/2001  . Dyshidrosis 06/25/2007  . GERD 12/06/2007  . HYPERLIPIDEMIA 12/03/2006  . INTERNAL HEMORRHOIDS 05/20/2001  . OBESITY, MODERATE 12/05/2008  .  OSTEOARTHRITIS 06/25/2007  . VENOUS INSUFFICIENCY 12/04/2006    Surgical History: Past Surgical History:  Procedure Laterality Date  . ANGIOPLASTY     2 stents  . FOOT SURGERY    . JOINT REPLACEMENT     bilat knee  . TONSILLECTOMY      Home Medications:  Allergies as of 08/06/2020      Reactions   Oxycodone-aspirin Nausea And Vomiting      Medication List       Accurate as of Aug 06, 2020  9:45 AM. If you have any questions, ask your nurse or doctor.        STOP taking these medications   oxybutynin 10 MG 24 hr tablet Commonly known as: DITROPAN-XL Stopped by: Hollice Espy, MD     TAKE these medications   atorvastatin 40 MG tablet Commonly known as: LIPITOR TAKE 1 TABLET(40 MG) BY MOUTH DAILY   Eliquis 5 MG Tabs tablet Generic drug: apixaban TAKE 1 TABLET(5 MG) BY MOUTH TWICE DAILY   nitroGLYCERIN 0.4 MG SL tablet Commonly known as: NITROSTAT Place 1 tablet (0.4 mg  total) under the tongue every 5 (five) minutes as needed.   PreserVision AREDS Tabs Take by mouth. Take 1 tablet by mouth twice daily       Allergies:  Allergies  Allergen Reactions  . Oxycodone-Aspirin Nausea And Vomiting    Family History: Family History  Problem Relation Age of Onset  . Colon cancer Mother 58    Social History:  reports that he quit smoking about 39 years ago. He has never used smokeless tobacco. He reports current alcohol use. He reports that he does not use drugs.   Physical Exam: BP 138/78   Pulse 71   Ht 5\' 7"  (1.702 m)   Wt 262 lb (118.8 kg)   BMI 41.04 kg/m   Constitutional:  Alert and oriented, No acute distress. HEENT: [place] AT, moist mucus membranes.  Trachea midline, no masses. Cardiovascular: No clubbing, cyanosis, or edema. Respiratory: Normal respiratory effort, no increased work of breathing. GI: Obese Neurologic: Grossly intact, no focal deficits, moving all 4 extremities. Psychiatric: Normal mood and affect.  Laboratory Data: Lab Results  Component Value Date   WBC 13.1 (H) 07/17/2019   HGB 15.2 07/17/2019   HCT 47.0 07/17/2019   MCV 88.3 07/17/2019   PLT 196 07/17/2019    Lab Results  Component Value Date   CREATININE 1.18 07/17/2019    Urinalysis    Component Value Date/Time   COLORURINE YELLOW 08/06/2020 0911   APPEARANCEUR CLEAR 08/06/2020 0911   LABSPEC 1.025 08/06/2020 0911   PHURINE 5.5 08/06/2020 0911   GLUCOSEU NEGATIVE 08/06/2020 0911   HGBUR NEGATIVE 08/06/2020 0911   HGBUR negative 07/22/2009 0826   BILIRUBINUR NEGATIVE 08/06/2020 0911   BILIRUBINUR n 01/11/2011 0955   KETONESUR NEGATIVE 08/06/2020 0911   PROTEINUR NEGATIVE 08/06/2020 0911   UROBILINOGEN 0.2 01/11/2011 0955   UROBILINOGEN 0.2 07/22/2009 0826   NITRITE NEGATIVE 08/06/2020 0911   LEUKOCYTESUR NEGATIVE 08/06/2020 0911    Lab Results  Component Value Date   BACTERIA MANY (A) 08/06/2020    Pertinent Imaging: PVR 0  Assessment &  Plan:    1. Benign prostatic hyperplasia with nocturia Continues to complain of same symptoms, has failed multiple medications including Myrbetriq, oxybutynin, finasteride, saw palmetto, etc.  Continue to try to explain to him that I suspect he has underlying sleep apnea with a history of snoring and neck girth.  This will likely improve his  nighttime symptoms should he pursue a sleep study and treat his probable underlying sleep apnea.  I will contact his primary care to help continue to reiterate this message.  We also discussed behavioral modification and benefits of weight loss today.  No evidence of urinary retention or UTI.  It is unclear whether or not he is tried an alpha-blocker, we will try him on Flomax and Gemtesa combination therapy today as he has never tried these medications.  Will follow him closely this time, reevaluate in 1 month.  If his symptoms fail to improve, will consider cystoscopy and/or urodynamics to help further define/guide care to improve his urinary symptoms. - BLADDER SCAN AMB NON-IMAGING   Hollice Espy, MD  [facility] [address], [place], [place] [address] [phone]

## 2020-08-06 NOTE — Progress Notes (Signed)
Thank-you for your note.  For OSA, I don't know what else to do except refer him to sleep med and suggest sleep eval, but he has not kept appointments or declined in the past.  His bladder has been a long-standing problem, so I appreciate your help with everything.  I will bring up OSA again the next time he is in the office.

## 2020-08-09 ENCOUNTER — Telehealth: Payer: Self-pay

## 2020-08-09 NOTE — Telephone Encounter (Signed)
Incoming call from pt on triage line requesting advice on what time of day is best to take Gemtesa and Flomax, also questions if it will hurt to take medications together. Advised pt that there is no drug to drug interaction between these medications and that if it will help him be compliant with the medications he may take them together. Advised pt to take medications in the morning as well to promote better compliance. Pt gave verbal understanding.

## 2020-08-16 DIAGNOSIS — D044 Carcinoma in situ of skin of scalp and neck: Secondary | ICD-10-CM | POA: Diagnosis not present

## 2020-08-16 DIAGNOSIS — L821 Other seborrheic keratosis: Secondary | ICD-10-CM | POA: Diagnosis not present

## 2020-08-16 DIAGNOSIS — C44329 Squamous cell carcinoma of skin of other parts of face: Secondary | ICD-10-CM | POA: Diagnosis not present

## 2020-08-16 DIAGNOSIS — D492 Neoplasm of unspecified behavior of bone, soft tissue, and skin: Secondary | ICD-10-CM | POA: Diagnosis not present

## 2020-08-16 DIAGNOSIS — L814 Other melanin hyperpigmentation: Secondary | ICD-10-CM | POA: Diagnosis not present

## 2020-08-16 DIAGNOSIS — L570 Actinic keratosis: Secondary | ICD-10-CM | POA: Diagnosis not present

## 2020-08-25 ENCOUNTER — Telehealth: Payer: Self-pay

## 2020-08-25 NOTE — Chronic Care Management (AMB) (Addendum)
Chronic Care Management Pharmacy Assistant   Name: Hunter Young  MRN: [id number] DOB: [birth year]  Reason for Encounter: Disease State - HTN   Recent office visits:  None since last CCM contact  Recent consult visits:  08/16/20 - Dermatology - No medication changes 08/06/20 - Urology - Start on tamsulosin 0.4mg  one tablet daily and vibegron 75mg  take 1 tablet daily. Stop Oxybutynin. Refer sleep study. 07/28/20 - Cardiology - No medication changes  Hospital visits:  None in previous 6 months  Medications: Outpatient Encounter Medications as of 08/25/2020  Medication Sig   atorvastatin (LIPITOR) 40 MG tablet TAKE 1 TABLET(40 MG) BY MOUTH DAILY   ELIQUIS 5 MG TABS tablet TAKE 1 TABLET(5 MG) BY MOUTH TWICE DAILY   Multiple Vitamins-Minerals (PRESERVISION AREDS) TABS Take by mouth. Take 1 tablet by mouth twice daily   nitroGLYCERIN (NITROSTAT) 0.4 MG SL tablet Place 1 tablet (0.4 mg total) under the tongue every 5 (five) minutes as needed.   tamsulosin (FLOMAX) 0.4 MG CAPS capsule Take 1 capsule (0.4 mg total) by mouth daily.   Vibegron (GEMTESA) 75 MG TABS Take 75 mg by mouth daily.   No facility-administered encounter medications on file as of 08/25/2020.    Recent Office Vitals: BP Readings from Last 3 Encounters:  08/06/20 138/78  07/28/20 122/60  01/15/20 120/80   Pulse Readings from Last 3 Encounters:  08/06/20 71  07/28/20 71  01/15/20 76    Wt Readings from Last 3 Encounters:  08/06/20 262 lb (118.8 kg)  07/28/20 262 lb 6 oz (119 kg)  01/15/20 255 lb (115.7 kg)     Kidney Function Lab Results  Component Value Date/Time   CREATININE 1.18 07/17/2019 04:01 PM   CREATININE 1.01 07/03/2019 12:59 PM   GFR 69.44 10/18/2018 08:01 AM   GFRNONAA 58 (L) 07/17/2019 04:01 PM   GFRAA >60 07/17/2019 04:01 PM    BMP Latest Ref Rng & Units 07/17/2019 07/03/2019 10/18/2018  Glucose 70 - 99 mg/dL 101(H) 108(H) 145(H)  BUN 8 - 23 mg/dL 22 19 23   Creatinine 0.61 - 1.24 mg/dL 1.18  1.01 1.03  Sodium 135 - 145 mmol/L 132(L) 137 140  Potassium 3.5 - 5.1 mmol/L 4.6 4.6 4.1  Chloride 98 - 111 mmol/L 100 104 105  CO2 22 - 32 mmol/L 21(L) 25 27  Calcium 8.9 - 10.3 mg/dL 9.1 9.7 9.4    Current antihypertensive regimen: No pharmacotherapy  The patient states his blood pressure is fine and he has not taken it lately, he was encouraged to take his blood pressure and write down readings for the future.  Caffeine intake: 1 cup of coffee in the morning Salt intake: Limits salt intake OTC medications including pseudoephedrine or NSAIDs? No  What recent interventions/DTPs have been made by any provider to improve Blood Pressure control since last CPP Visit: none identified  Any recent hospitalizations or ED visits since last visit with CPP? No  What diet changes have been made to improve Blood Pressure Control? none identified  What exercise is being done to improve your Blood Pressure Control? the patient reports nothing specific but he stays active and not sit too long   Adherence Review: Is the patient currently on ACE/ARB medication? No Does the patient have >5 day gap between last estimated fill dates? No   Star Rating Drugs:  Medication:  Last Fill: Day Supply Atorvastatin 40mg  06/27/20  90  Follow-Up:  Pharmacist Review  [name], CPP notified  [facility], CCMA  Clinical Pharmacy Assistant [phone]  I have reviewed the care management and care coordination activities outlined in this encounter and I am certifying that I agree with the content of this note. No further action required.  [name], PharmD Clinical Pharmacist [facility] at [facility] [phone]

## 2020-08-26 NOTE — Telephone Encounter (Signed)
Please call back and check on Eliquis PAP application - has he completed this for cost concerns? We have mailed it multiple times. Please coordinate next steps if he has it.  Also following up on concerns from January, has he had an falls in the past 3 months?  Is he taking any OTC medications?  Thanks,  [name], PharmD Clinical Pharmacist [facility] at [facility] [phone]

## 2020-09-03 ENCOUNTER — Ambulatory Visit: Payer: Medicare Other | Admitting: Urology

## 2020-09-03 ENCOUNTER — Ambulatory Visit: Payer: Self-pay | Admitting: Urology

## 2020-09-03 ENCOUNTER — Other Ambulatory Visit: Payer: Self-pay

## 2020-09-03 ENCOUNTER — Encounter: Payer: Self-pay | Admitting: Urology

## 2020-09-03 VITALS — BP 152/82 | HR 90 | Ht 68.0 in | Wt 261.0 lb

## 2020-09-03 DIAGNOSIS — N401 Enlarged prostate with lower urinary tract symptoms: Secondary | ICD-10-CM | POA: Diagnosis not present

## 2020-09-03 DIAGNOSIS — R351 Nocturia: Secondary | ICD-10-CM | POA: Diagnosis not present

## 2020-09-03 LAB — BLADDER SCAN AMB NON-IMAGING

## 2020-09-03 MED ORDER — GEMTESA 75 MG PO TABS: 75.0000 mg | ORAL_TABLET | Freq: Every day | ORAL | 11 refills | Status: DC

## 2020-09-03 NOTE — Progress Notes (Signed)
09/03/2020 2:08 PM   Hunter Young 1940/08/22 [id number]  Referring provider: Owens Loffler, MD [address] [place],  [place] [address]  Chief Complaint  Patient presents with   Benign Prostatic Hypertrophy    28mo w/PVR    HPI: 80 year old returns for 1 month follow-up of urinary frequency symptoms.  Please see previous notes for details.  He tried multiple medications in the past of which none were particularly helpful.  He as failed failed multiple medications including Myrbetriq, oxybutynin, finasteride, saw palmetto, etc.  Last visit, we tried him on a combination of Flomax and Gemtesa in combination.  He reports today, he is actually seeing some relief with this commendation.  He is to get up every 2 hours to void, 6 or 7 times a night and now is down to about 3 times a night.,  IPSS previously 21 now 11.  He is pretty pleased with these results.  He said no side effects.     IPSS     Row Name 09/03/20 1400         International Prostate Symptom Score   How often have you had the sensation of not emptying your bladder? Less than 1 in 5     How often have you had to urinate less than every two hours? About half the time     How often have you found you stopped and started again several times when you urinated? Not at All     How often have you found it difficult to postpone urination? Not at All     How often have you had a weak urinary stream? More than half the time     How often have you had to strain to start urination? Not at All     How many times did you typically get up at night to urinate? 3 Times     Total IPSS Score 11           Quality of Life due to urinary symptoms     If you were to spend the rest of your life with your urinary condition just the way it is now how would you feel about that? Unhappy             Score:  1-7 Mild 8-19 Moderate 20-35 Severe   PMH: Past Medical History:  Diagnosis Date   ALLERGIC RHINITIS 04/22/2007    BENIGN PROSTATIC HYPERTROPHY, WITH URINARY OBSTRUCTION 06/25/2007   Cardiomegaly 05/30/2007   Carotid stenosis 01/29/2014   CERVICAL DISORDER, NOS 10/14/2009   CORONARY ARTERY DISEASE 12/03/2006   DIVERTICULOSIS OF COLON 05/20/2001   Dyshidrosis 06/25/2007   GERD 12/06/2007   HYPERLIPIDEMIA 12/03/2006   INTERNAL HEMORRHOIDS 05/20/2001   OBESITY, MODERATE 12/05/2008   OSTEOARTHRITIS 06/25/2007   VENOUS INSUFFICIENCY 12/04/2006    Surgical History: Past Surgical History:  Procedure Laterality Date   ANGIOPLASTY     2 stents   FOOT SURGERY     JOINT REPLACEMENT     bilat knee   TONSILLECTOMY      Home Medications:  Allergies as of 09/03/2020       Reactions   Oxycodone-aspirin Nausea And Vomiting        Medication List        Accurate as of September 03, 2020  2:08 PM. If you have any questions, ask your nurse or doctor.          atorvastatin 40 MG tablet Commonly known as: LIPITOR TAKE 1  TABLET(40 MG) BY MOUTH DAILY   Eliquis 5 MG Tabs tablet Generic drug: apixaban TAKE 1 TABLET(5 MG) BY MOUTH TWICE DAILY   Gemtesa 75 MG Tabs Generic drug: Vibegron Take 75 mg by mouth daily.   nitroGLYCERIN 0.4 MG SL tablet Commonly known as: NITROSTAT Place 1 tablet (0.4 mg total) under the tongue every 5 (five) minutes as needed.   PreserVision AREDS Tabs Take by mouth. Take 1 tablet by mouth twice daily   tamsulosin 0.4 MG Caps capsule Commonly known as: Flomax Take 1 capsule (0.4 mg total) by mouth daily.        Allergies:  Allergies  Allergen Reactions   Oxycodone-Aspirin Nausea And Vomiting    Family History: Family History  Problem Relation Age of Onset   Colon cancer Mother 64    Social History:  reports that he quit smoking about 40 years ago. His smoking use included cigarettes. He has never used smokeless tobacco. He reports current alcohol use. He reports that he does not use drugs.   Physical Exam: BP (!) 152/82   Pulse 90   Ht 5\' 8"  (1.727 m)   Wt 261  lb (118.4 kg)   BMI 39.68 kg/m   Constitutional:  Alert and oriented, No acute distress. HEENT: [place] AT, moist mucus membranes.  Trachea midline, no masses. Cardiovascular: No clubbing, cyanosis, or edema. Respiratory: Normal respiratory effort, no increased work of breathing. Skin: No rashes, bruises or suspicious lesions. Neurologic: Grossly intact, no focal deficits, moving all 4 extremities. Psychiatric: Normal mood and affect.  Laboratory Data: Lab Results  Component Value Date   WBC 13.1 (H) 07/17/2019   HGB 15.2 07/17/2019   HCT 47.0 07/17/2019   MCV 88.3 07/17/2019   PLT 196 07/17/2019    Lab Results  Component Value Date   CREATININE 1.18 07/17/2019    Pertinent Imaging: Results for orders placed or performed in visit on 09/03/20  BLADDER SCAN AMB NON-IMAGING  Result Value Ref Range   Scan Result 76ml      Assessment & Plan:    1. Benign prostatic hyperplasia with nocturia Emptying his bladder and this commendation of drugs, Flomax and Gemtesa 75 mg which seems to be a good combination of therapy for him with a dramatic improvement in his IPSS score  Continue to encourage behavioral modification  He is worried today about how much [place] will cost, will work to get a prior authorization and perhaps a tier exception given that he is tried and failed multiple previous drugs.  Also, with his age and comorbidities, he is high risk for cognitive issues with anticholinergic medications.  - BLADDER SCAN AMB NON-IMAGING   Return in about 1 year (around 09/03/2021) for 1year w/PVR IPSS.  Hollice Espy, MD  [facility] [address], [place], [place] [address] [phone]

## 2020-09-06 ENCOUNTER — Telehealth: Payer: Self-pay | Admitting: *Deleted

## 2020-09-06 NOTE — Telephone Encounter (Signed)
Started PA for [employer] [id number]

## 2020-09-07 NOTE — Progress Notes (Addendum)
Spoke with the patient and he has not had any falls over the past couple of months.The patient reports his OTC medications are a few vitamins and he uses PreserVision for his eyes. He does not remember where he put his application for the PAP Eliquis so I will mail to the patient and he will take to PCP to sign.  [name], CPP notified  Time: 5 min Avel Sensor, [place] Assistant [phone]  I have reviewed the care management and care coordination activities outlined in this encounter and I am certifying that I agree with the content of this note. No further action required.  [name], PharmD Clinical Pharmacist [facility] at [facility] [phone]

## 2020-09-09 NOTE — Chronic Care Management (AMB) (Signed)
Eliquis applications has been mailed to patients home address per his request.    [name], CPP notified  Margaretmary Dys, [place] Pharmacy Assistant [phone]

## 2020-10-07 ENCOUNTER — Telehealth: Payer: Self-pay

## 2020-10-07 NOTE — Chronic Care Management (AMB) (Addendum)
    Chronic Care Management Pharmacy Assistant   Name: Hunter Young  MRN: [id number] DOB: 03/02/1941  Reason for Encounter: Disease State Call General Adherence   Recent office visits:  None since last CCM contact  Recent consult visits:  09/03/20 - Urology no medication changes  Hospital visits:  None in previous 6 months  Medications: Outpatient Encounter Medications as of 10/07/2020  Medication Sig   atorvastatin (LIPITOR) 40 MG tablet TAKE 1 TABLET(40 MG) BY MOUTH DAILY   ELIQUIS 5 MG TABS tablet TAKE 1 TABLET(5 MG) BY MOUTH TWICE DAILY   Multiple Vitamins-Minerals (PRESERVISION AREDS) TABS Take by mouth. Take 1 tablet by mouth twice daily   nitroGLYCERIN (NITROSTAT) 0.4 MG SL tablet Place 1 tablet (0.4 mg total) under the tongue every 5 (five) minutes as needed.   tamsulosin (FLOMAX) 0.4 MG CAPS capsule Take 1 capsule (0.4 mg total) by mouth daily.   Vibegron (GEMTESA) 75 MG TABS Take 75 mg by mouth daily.   No facility-administered encounter medications on file as of 10/07/2020.    Patient is not > 5 days past due for refill on the following medications per chart history:  Star Medications: Medication Name/mg Last Fill Days Supply Atorvastatin 40mg   09/28/20  90  Attempted contact with [name] 3 times on 10/07/20,10/11/20,10/13/20  Unsuccessful outreach.    [name], CPP notified  Avel Sensor, [place] Assistant [phone]  I have reviewed the care management and care coordination activities outlined in this encounter and I am certifying that I agree with the content of this note. No further action required.  [name], PharmD Clinical Pharmacist [facility] at [facility] [phone]

## 2020-10-21 DIAGNOSIS — D044 Carcinoma in situ of skin of scalp and neck: Secondary | ICD-10-CM | POA: Diagnosis not present

## 2020-10-21 DIAGNOSIS — L578 Other skin changes due to chronic exposure to nonionizing radiation: Secondary | ICD-10-CM | POA: Diagnosis not present

## 2020-10-21 DIAGNOSIS — L988 Other specified disorders of the skin and subcutaneous tissue: Secondary | ICD-10-CM | POA: Diagnosis not present

## 2020-10-21 DIAGNOSIS — C44329 Squamous cell carcinoma of skin of other parts of face: Secondary | ICD-10-CM | POA: Diagnosis not present

## 2020-10-21 DIAGNOSIS — L814 Other melanin hyperpigmentation: Secondary | ICD-10-CM | POA: Diagnosis not present

## 2020-11-22 ENCOUNTER — Telehealth: Payer: Self-pay | Admitting: Cardiovascular Disease

## 2020-11-22 NOTE — Telephone Encounter (Signed)
Please call to discuss Eliquis. He needs to discuss the cost, and if there is an alternative

## 2020-11-23 NOTE — Telephone Encounter (Signed)
Patient is calling back to find the status of his Eliquis. See telephone encounter from yesterday

## 2020-11-23 NOTE — Telephone Encounter (Signed)
Patient calling to let us know he found application and was advised to return to pcp office as below. He will do this tomorrow .

## 2020-11-23 NOTE — Telephone Encounter (Signed)
Reached back out to pt after speaking to PharmD with [employer], rearding his Eliquis. Noted on file that PA had been mailed to pt back in June,  [name], [facility] stated  "We have mailed him the assistance forms several times and offered that he come into the office and sign as well.  He has never followed through"  Pt reports "now that I think of it, I think they did". Pt would like to look through his things and give a call back when he finds the application.

## 2020-11-24 ENCOUNTER — Telehealth: Payer: Self-pay

## 2020-11-24 NOTE — Progress Notes (Addendum)
The patient found the application for Eliquis and will bring it to the [facility] office today and turn in for mailing.  [name], CPP notified  Avel Sensor, [place] Assistant [phone]

## 2020-12-14 DIAGNOSIS — Z85828 Personal history of other malignant neoplasm of skin: Secondary | ICD-10-CM | POA: Diagnosis not present

## 2020-12-24 ENCOUNTER — Telehealth: Payer: Self-pay

## 2020-12-24 NOTE — Progress Notes (Addendum)
    Chronic Care Management Pharmacy Assistant   Name: Hunter Young  MRN: [id number] DOB: 1941-03-02  Reason for Encounter: Eliquis - BMS PAP Update  01/13/21 - Spoke with pt; he never took the forms to the office. He is going to find them and either mail them to the [place] location or take them to the office. Patient will call me back and let me know.   12/23/20 - Spoke with patient; patient has PAP for Eliquis - BMS Patient never took it to doctor's office; states he will take it to [place] location  (I gave patient address and phone number to [place])  [name], CPP notified  Marijean Niemann, [place] Assistant [phone]   I have reviewed the care management and care coordination activities outlined in this encounter and I am certifying that I agree with the content of this note. No further action required.  [name], PharmD Clinical Pharmacist [place] [facility] at [facility] [phone]

## 2020-12-27 ENCOUNTER — Other Ambulatory Visit: Payer: Self-pay | Admitting: Cardiovascular Disease

## 2020-12-28 ENCOUNTER — Ambulatory Visit: Payer: Medicare Other | Attending: Internal Medicine

## 2020-12-28 ENCOUNTER — Other Ambulatory Visit: Payer: Self-pay

## 2020-12-28 DIAGNOSIS — Z23 Encounter for immunization: Secondary | ICD-10-CM

## 2020-12-28 MED ORDER — PFIZER COVID-19 VAC BIVALENT 30 MCG/0.3ML IM SUSP
INTRAMUSCULAR | 0 refills | Status: DC
  Filled 2020-12-28: qty 0.3, 30d supply, fill #0

## 2020-12-28 NOTE — Progress Notes (Signed)
   Covid-19 Vaccination Clinic  Name:  Hunter Young    MRN: [id number] DOB: 1940-12-13  12/28/2020  [name] was observed post Covid-19 immunization for 15 minutes without incident. He was provided with Vaccine Information Sheet and instruction to access the V-Safe system.   [name] was instructed to call 911 with any severe reactions post vaccine: Difficulty breathing  Swelling of face and throat  A fast heartbeat  A bad rash all over body  Dizziness and weakness   Lu Duffel, PharmD, MBA Clinical Acute Care Pharmacist

## 2021-01-17 IMAGING — CT CT ABD-PEL WO/W CM
3 of 8 series · 11 of 36 positions shown, 17 images · IV contrast (omnipaque)
Comparison: None.

CLINICAL DATA: Jaundice pancreatitis.

EXAM:
CT ABDOMEN AND PELVIS WITHOUT AND WITH CONTRAST
TECHNIQUE: Multidetector CT imaging of the abdomen and pelvis was performed
following the standard protocol before and following the bolus
administration of intravenous contrast.
CONTRAST:  125mL OMNIPAQUE IOHEXOL 300 MG/ML  SOLN

[Series 8: axial arterial · axial · arterial · 0.89mm/px · z∈[-905,-779]mm · 3 of 171 slices shown]
[im 22/171  soft-tissue]
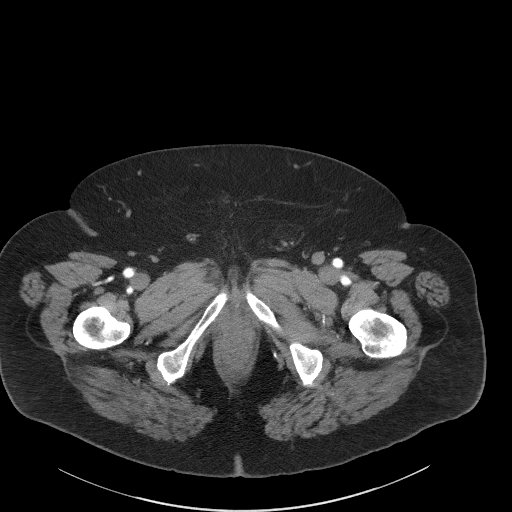
[im 43/171  soft-tissue]
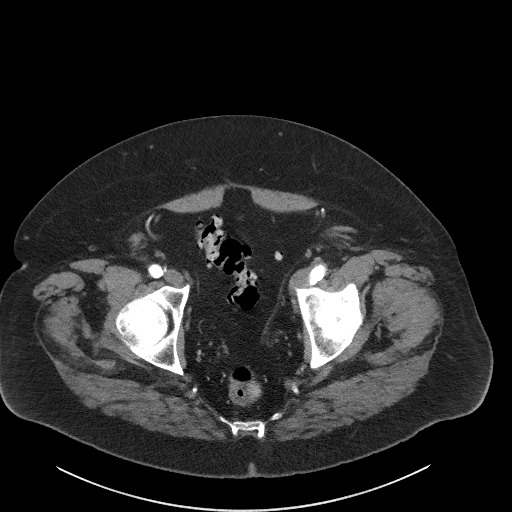
[im 64/171  soft-tissue]
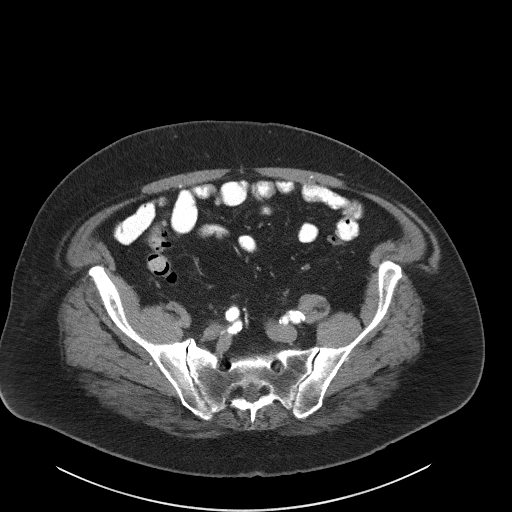

[Series 13: axial venous · axial · portal-venous · 0.89mm/px · z∈[-905,-524]mm · 7 of 171 slices shown, 12 images]
[im 22/171  soft-tissue]
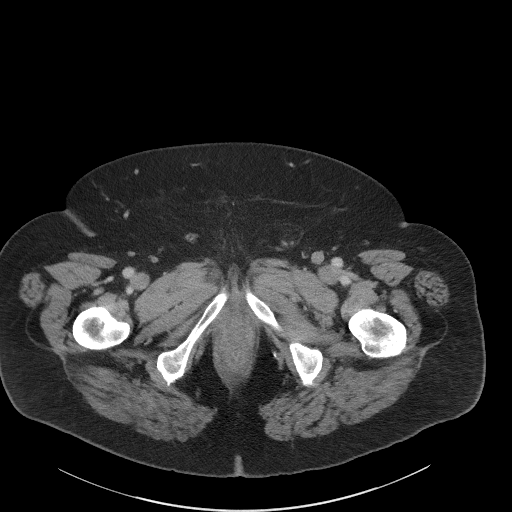
[im 22/171  bone]
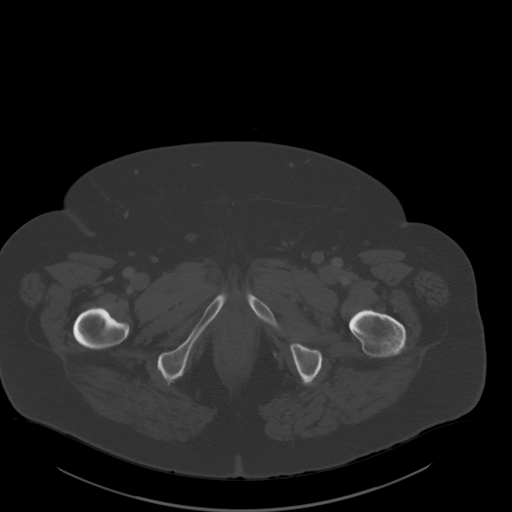
[im 43/171  soft-tissue]
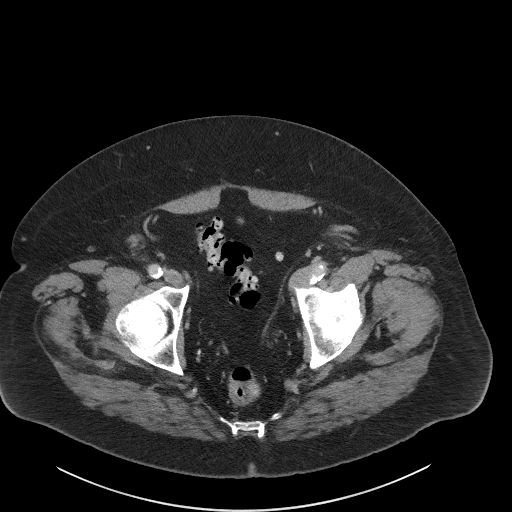
[im 64/171  soft-tissue]
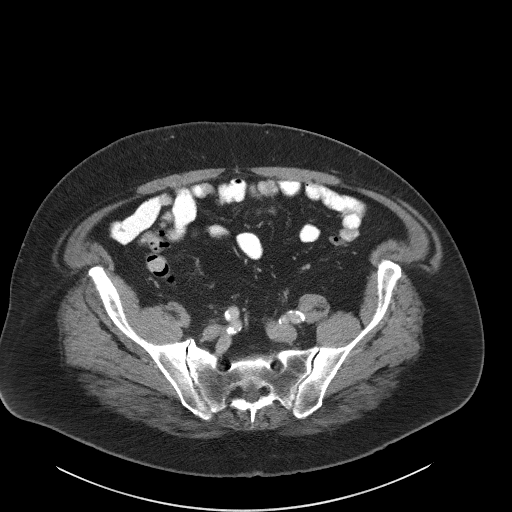
[im 86/171  soft-tissue]
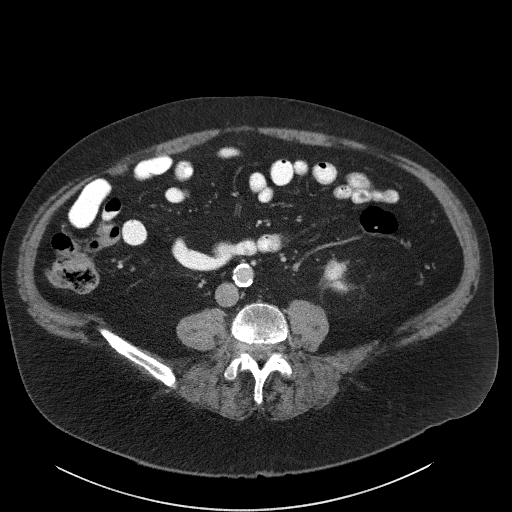
[im 86/171  lung]
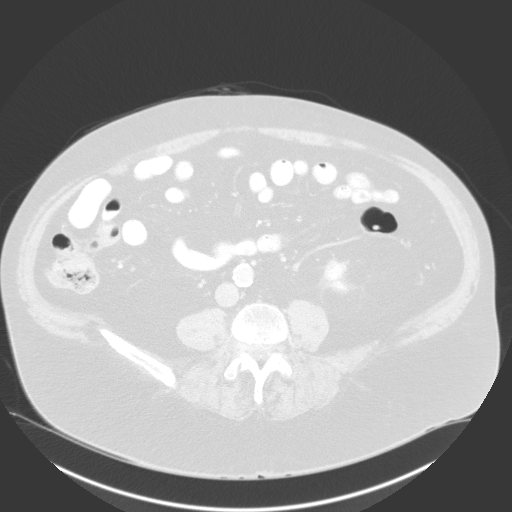
[im 107/171  soft-tissue]
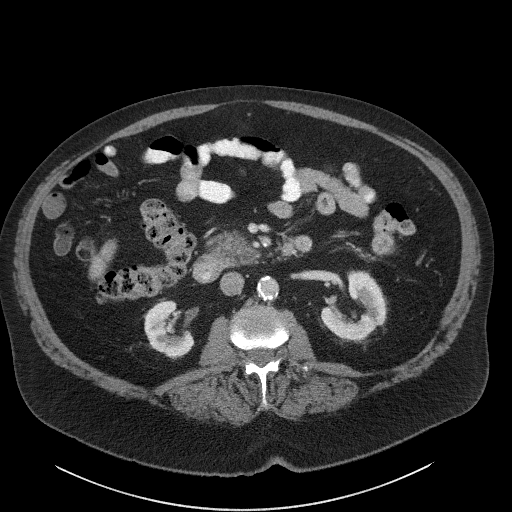
[im 107/171  lung]
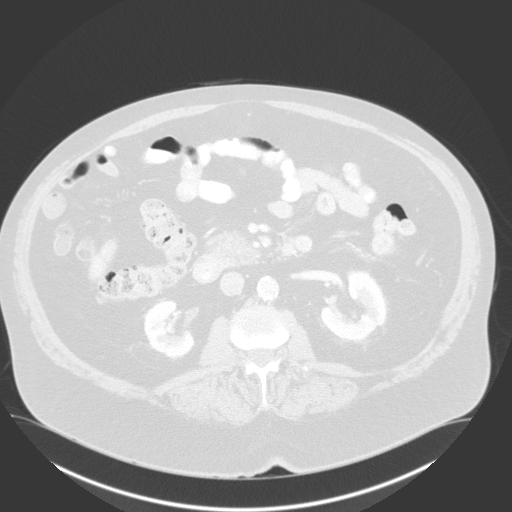
[im 128/171  soft-tissue]
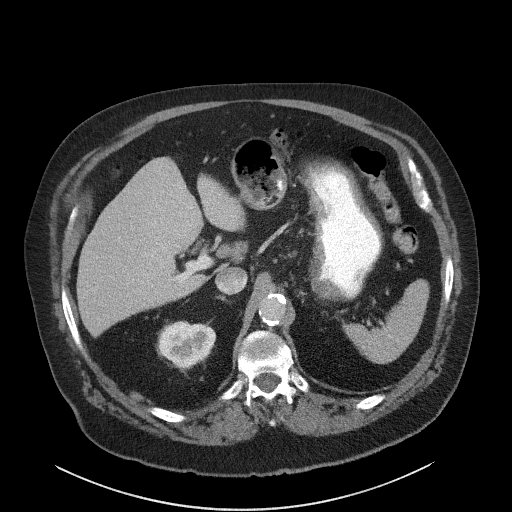
[im 128/171  lung]
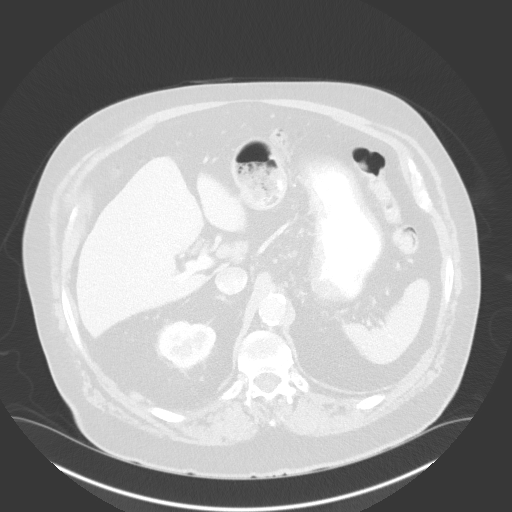
[im 149/171  soft-tissue]
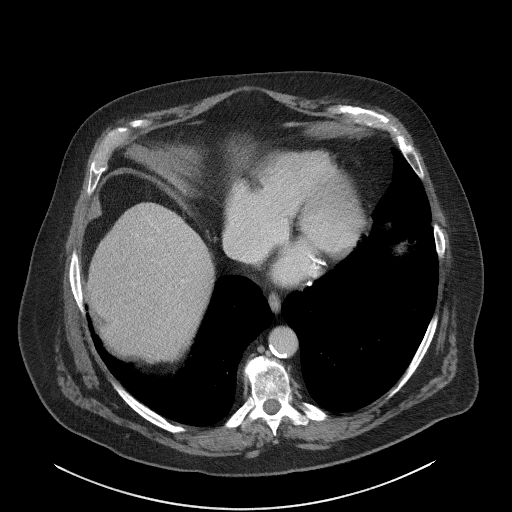
[im 149/171  lung]
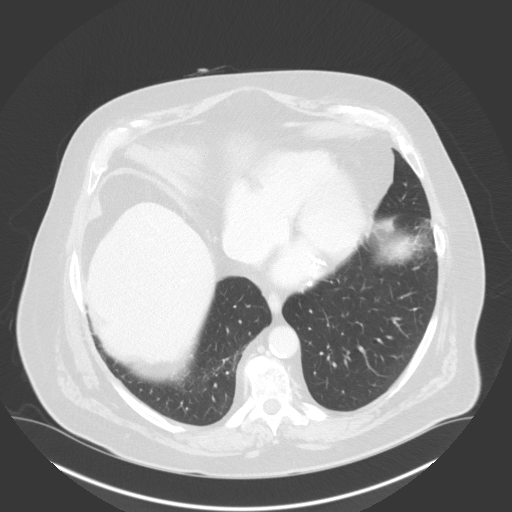

[Series 20: axial delay sag · sagittal · delayed · 0.70mm/px · 1 of 157 slices shown, 2 images]
[im 53/157  soft-tissue]
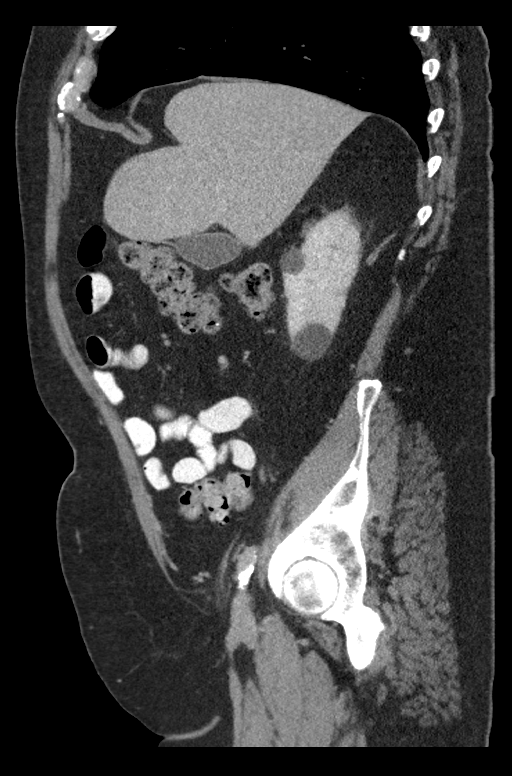
[im 53/157  bone]
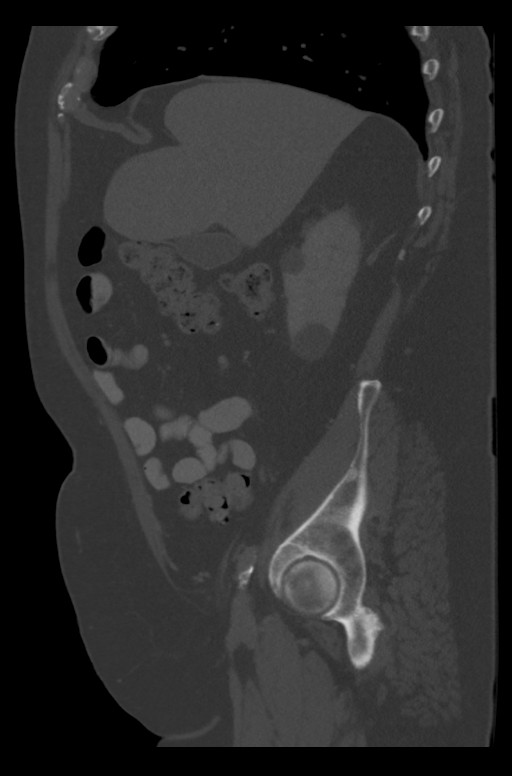

[11 of 36 positions shown; findings below may reference images not displayed]

FINDINGS: Lower chest: Unremarkable

Hepatobiliary: No suspicious focal abnormality within the liver
parenchyma. There is no evidence for gallstones, gallbladder wall
thickening, or pericholecystic fluid. No intrahepatic or
extrahepatic biliary dilation. Common bile duct measures 6 mm
diameter.

Pancreas: Pancreas appears diffusely edematous with mild
peripancreatic edema/inflammation. Some images show a thin rim like
capsule (see image 54/series 8). No dilatation of the main
pancreatic duct.

Spleen: No splenomegaly. No focal mass lesion.

Adrenals/Urinary Tract: No adrenal nodule or mass. Pre contrast
imaging shows no renal, ureteral, or bladder stones.

Imaging after IV contrast administration shows a 2 cm cyst in the
interpolar right kidney with 2.6 cm right lower pole renal cyst
evident. Exophytic 2.3 cm upper pole cyst noted left kidney with
cm cyst noted lower pole left kidney. Tiny bilateral hypoattenuating
renal lesions are too small to characterize but likely benign.

Stomach/Bowel: Stomach is unremarkable. No gastric wall thickening.
No evidence of outlet obstruction. Duodenum is normally positioned
as is the ligament of Treitz. No small bowel wall thickening. No
small bowel dilatation. The terminal ileum is normal. The appendix
is not visualized, but there is no edema or inflammation in the
region of the cecum. No gross colonic mass. No colonic wall
thickening.

Vascular/Lymphatic: No abdominal aortic aneurysm. No abdominal
aortic atherosclerotic calcification. There is no gastrohepatic or
hepatoduodenal ligament lymphadenopathy. No retroperitoneal or
mesenteric lymphadenopathy. No pelvic sidewall lymphadenopathy.

Reproductive: The prostate gland and seminal vesicles are
unremarkable.

Other: No intraperitoneal free fluid.

Musculoskeletal: Small left groin hernia contains only fat. No
worrisome lytic or sclerotic osseous abnormality.
IMPRESSION: 1. Pancreas appears diffusely edematous with mild peripancreatic
edema/inflammation. No dilatation of the main pancreatic duct. Some
images suggest the presence of a thin rim like capsule. Imaging
features are compatible with diffuse pancreatitis and autoimmune
pancreatitis would be a distinct consideration. No evidence for
pseudocyst.
2. Bilateral renal cysts with tiny bilateral hypoattenuating renal
lesions too small to characterize but likely benign.
3. Small left groin hernia contains only fat.

These results will be called to the ordering clinician or
representative by the Radiologist Assistant, and communication
documented in the PACS or [REDACTED].

## 2021-01-19 ENCOUNTER — Telehealth: Payer: Self-pay

## 2021-01-19 NOTE — Progress Notes (Signed)
    Chronic Care Management Pharmacy Assistant   Name: Hunter Young  MRN: [id number] DOB: 19-Oct-1940  Reason for Encounter: CCM - Eliquis ([facility]) Patient Assistance   01/19/21 - Spoke with pt; he has not returned the forms; patient stated he would do it this week.  Time Spent: 5 Minutes  01/13/21 - Spoke with pt; he never took forms in; he is going to go to [place] location Monday to turn it in.  12/23/2020 Called [facility]; no PAP on file; called patient and he has not taken it to office yet. Pt stated he would get it and take it to [place] location.  11/24/20 patient found application and to bring to [facility] today.   [name], CPP notified  Marijean Niemann, [place] Clinical Pharmacy Assistant [phone]

## 2021-01-25 ENCOUNTER — Telehealth: Payer: Self-pay

## 2021-01-25 NOTE — Chronic Care Management (AMB) (Addendum)
Chronic Care Management Pharmacy Assistant   Name: Hunter Young  MRN: [id number] DOB: 12-11-1940  Reason for Encounter:  Hypertension Disease State   Recent office visits:  None in 6 months  Recent consult visits:  12/14/20-Dermatology-Patient presented for facial surgical site follow up. No medication changes.  Hospital visits:  None in previous 6 months  Medications: Outpatient Encounter Medications as of 01/25/2021  Medication Sig   atorvastatin (LIPITOR) 40 MG tablet TAKE 1 TABLET BY MOUTH EVERY DAY   COVID-19 mRNA bivalent vaccine, Pfizer, (PFIZER COVID-19 VAC BIVALENT) injection Inject into the muscle.   ELIQUIS 5 MG TABS tablet TAKE 1 TABLET(5 MG) BY MOUTH TWICE DAILY   Multiple Vitamins-Minerals (PRESERVISION AREDS) TABS Take by mouth. Take 1 tablet by mouth twice daily   nitroGLYCERIN (NITROSTAT) 0.4 MG SL tablet Place 1 tablet (0.4 mg total) under the tongue every 5 (five) minutes as needed.   tamsulosin (FLOMAX) 0.4 MG CAPS capsule Take 1 capsule (0.4 mg total) by mouth daily.   Vibegron (GEMTESA) 75 MG TABS Take 75 mg by mouth daily.   No facility-administered encounter medications on file as of 01/25/2021.    Recent Office Vitals: BP Readings from Last 3 Encounters:  09/03/20 (!) 152/82  08/06/20 138/78  07/28/20 122/60   Pulse Readings from Last 3 Encounters:  09/03/20 90  08/06/20 71  07/28/20 71    Wt Readings from Last 3 Encounters:  09/03/20 261 lb (118.4 kg)  08/06/20 262 lb (118.8 kg)  07/28/20 262 lb 6 oz (119 kg)     Kidney Function Lab Results  Component Value Date/Time   CREATININE 1.18 07/17/2019 04:01 PM   CREATININE 1.01 07/03/2019 12:59 PM   GFR 69.44 10/18/2018 08:01 AM   GFRNONAA 58 (L) 07/17/2019 04:01 PM   GFRAA >60 07/17/2019 04:01 PM    BMP Latest Ref Rng & Units 07/17/2019 07/03/2019 10/18/2018  Glucose 70 - 99 mg/dL 101(H) 108(H) 145(H)  BUN 8 - 23 mg/dL 22 19 23   Creatinine 0.61 - 1.24 mg/dL 1.18 1.01 1.03  Sodium 135 -  145 mmol/L 132(L) 137 140  Potassium 3.5 - 5.1 mmol/L 4.6 4.6 4.1  Chloride 98 - 111 mmol/L 100 104 105  CO2 22 - 32 mmol/L 21(L) 25 27  Calcium 8.9 - 10.3 mg/dL 9.1 9.7 9.4     Contacted patient on 01/31/21 to discuss hypertension disease state  Current antihypertensive regimen:  No pharmacotherapy   How often are you checking your Blood Pressure? infrequently - the patient reports he feels fine and not checking home BP  Caffeine intake: 1 cup regular in morning Salt intake:Limits with adding to cooked food OTC medications including pseudoephedrine or NSAIDs? no  What recent interventions/DTPs have been made by any provider to improve Blood Pressure control since last CPP Visit: none identified.  The patient reports he has been feeling fine, not taking his BP and when he feels bad he will contact [place].  Any recent hospitalizations or ED visits since last visit with CPP? No  What diet changes have been made to improve Blood Pressure Control?   No changes in his diet  What exercise is being done to improve your Blood Pressure Control?  No formal exercise, however, the patient states he has been traveling and stays active.    Adherence Review: Is the patient currently on ACE/ARB medication? No Does the patient have >5 day gap between last estimated fill dates? No    Star Rating Drugs:  Medication:  Last Fill: Day Supply Atorvastatin 40mg  12/27/20 90  Care Gaps: Annual wellness visit in last year? No Most Recent BP reading: 152/82  90-P  09/03/20  No appointments scheduled within the next 30 days.  [name], CPP notified  Avel Sensor, [place] Assistant [phone]  I have reviewed the care management and care coordination activities outlined in this encounter and I am certifying that I agree with the content of this note. Will discuss at follow up visit. No further action required.  [name], PharmD Clinical Pharmacist [place]  [facility] [phone]

## 2021-03-01 NOTE — Progress Notes (Signed)
Subjective:   Hunter Young is a 80 y.o. male who presents for Medicare Annual/Subsequent preventive examination.  I connected with Lance Morin today by telephone and verified that I am speaking with the correct person using two identifiers. Location patient: home Location provider: work Persons participating in the virtual visit: patient, Marine scientist.    I discussed the limitations, risks, security and privacy concerns of performing an evaluation and management service by telephone and the availability of in person appointments. I also discussed with the patient that there may be a patient responsible charge related to this service. The patient expressed understanding and verbally consented to this telephonic visit.    Interactive audio and video telecommunications were attempted between this provider and patient, however failed, due to patient having technical difficulties OR patient did not have access to video capability.  We continued and completed visit with audio only.  Some vital signs may be absent or patient reported.   Time Spent with patient on telephone encounter: 30 minutes  Review of Systems     Cardiac Risk Factors include: advanced age (>35men, >38 women);hypertension;dyslipidemia     Objective:    Today's Vitals   03/04/21 1200  Weight: 262 lb (118.8 kg)  Height: 5\' 8"  (1.727 m)   Body mass index is 39.84 kg/m.  Advanced Directives 03/04/2021 07/17/2019 07/03/2019 07/31/2017 07/26/2016  Does Patient Have a Medical Advance Directive? No No No No No  Would patient like information on creating a medical advance directive? Yes (MAU/Ambulatory/Procedural Areas - Information given) - - No - Patient declined -    Current Medications (verified) Outpatient Encounter Medications as of 03/04/2021  Medication Sig   atorvastatin (LIPITOR) 40 MG tablet TAKE 1 TABLET BY MOUTH EVERY DAY   COVID-19 mRNA bivalent vaccine, Pfizer, (PFIZER COVID-19 VAC BIVALENT) injection Inject into the  muscle.   ELIQUIS 5 MG TABS tablet TAKE 1 TABLET(5 MG) BY MOUTH TWICE DAILY   Multiple Vitamins-Minerals (CENTRUM SILVER 50+MEN) TABS Take by mouth daily.   Multiple Vitamins-Minerals (PRESERVISION AREDS) TABS Take by mouth. Take 1 tablet by mouth twice daily   nitroGLYCERIN (NITROSTAT) 0.4 MG SL tablet Place 1 tablet (0.4 mg total) under the tongue every 5 (five) minutes as needed.   tamsulosin (FLOMAX) 0.4 MG CAPS capsule Take 1 capsule (0.4 mg total) by mouth daily.   Vibegron (GEMTESA) 75 MG TABS Take 75 mg by mouth daily.   No facility-administered encounter medications on file as of 03/04/2021.    Allergies (verified) Oxycodone-aspirin   History: Past Medical History:  Diagnosis Date   ALLERGIC RHINITIS 04/22/2007   BENIGN PROSTATIC HYPERTROPHY, WITH URINARY OBSTRUCTION 06/25/2007   Cardiomegaly 05/30/2007   Carotid stenosis 01/29/2014   CERVICAL DISORDER, NOS 10/14/2009   CORONARY ARTERY DISEASE 12/03/2006   DIVERTICULOSIS OF COLON 05/20/2001   Dyshidrosis 06/25/2007   GERD 12/06/2007   HYPERLIPIDEMIA 12/03/2006   INTERNAL HEMORRHOIDS 05/20/2001   OBESITY, MODERATE 12/05/2008   OSTEOARTHRITIS 06/25/2007   VENOUS INSUFFICIENCY 12/04/2006   Past Surgical History:  Procedure Laterality Date   ANGIOPLASTY     2 stents   FOOT SURGERY     JOINT REPLACEMENT     bilat knee   TONSILLECTOMY     Family History  Problem Relation Age of Onset   Colon cancer Mother 69   Social History   Socioeconomic History   Marital status: Married    Spouse name: Not on file   Number of children: Not on file   Years of education:  Not on file   Highest education level: Not on file  Occupational History   Not on file  Tobacco Use   Smoking status: Former    Types: Cigarettes    Quit date: 08/21/1980    Years since quitting: 40.5   Smokeless tobacco: Never  Vaping Use   Vaping Use: Never used  Substance and Sexual Activity   Alcohol use: Yes    Comment: occasional 1-2 drinks   Drug use: No    Sexual activity: Not Currently  Other Topics Concern   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       Works part time for [employer].    Runs hub to [place], [place]. 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    In trucking business for a long time.   Social Determinants of Health   Financial Resource Strain: Low Risk    Difficulty of Paying Living Expenses: Not hard at all  Food Insecurity: No Food Insecurity   Worried About Charity fundraiser in the Last Year: Never true   [place] in the Last Year: Never true  Transportation Needs: No Transportation Needs   Lack of Transportation (Medical): No   Lack of Transportation (Non-Medical): No  Physical Activity: Inactive   Days of Exercise per Week: 0 days   Minutes of Exercise per Session: 0 min  Stress: No Stress Concern Present   Feeling of Stress : Not at all  Social Connections: Socially Isolated   Frequency of Communication with Friends and Family: Never   Frequency of Social Gatherings with Friends and Family: Never   Attends Religious Services: Never   Marine scientist or Organizations: No   Attends Music therapist: Never   Marital Status: Married    Tobacco Counseling Counseling given: Not Answered   Clinical Intake:  Pre-visit preparation completed: Yes  Pain : No/denies pain     BMI - recorded: 39.84 Nutritional Status: BMI > 30  Obese Nutritional Risks: None Diabetes: No  How often do you need to have someone help you when you read instructions, pamphlets, or other written materials from your doctor or pharmacy?: 1 - Never  Diabetic? No  Interpreter Needed?: No  Information entered by :: Orrin Brigham LPN   Activities of Daily Living In your present state of health, do you have any difficulty performing the following activities: 03/04/2021  Hearing? Y  Vision? N  Difficulty concentrating or making decisions? N  Walking or climbing  stairs? Y  Dressing or bathing? N  Doing errands, shopping? N  Preparing Food and eating ? N  Using the Toilet? N  In the past six months, have you accidently leaked urine? Y  Do you have problems with loss of bowel control? N  Managing your Medications? N  Managing your Finances? N  Housekeeping or managing your Housekeeping? N  Some recent data might be hidden    Patient Care Team: Owens Loffler, MD as PCP - General (Family Medicine) Rockey Situ Kathlene November, MD as Consulting Physician (Cardiology) [name], [facility] as Pharmacist (Pharmacist)  Indicate any recent Medical Services you may have received from other than [facility] providers in the past year (date may be approximate).     Assessment:   This is a routine wellness examination for Manu.  Hearing/Vision screen Hearing Screening - Comments:: Patient states having ringing in both ears Vision Screening - Comments:: Last exam  2022, see eye Dr. In [facility]  Dietary issues and exercise activities discussed: Current Exercise Habits: The patient does not participate in regular exercise at present   Goals Addressed             This Visit's Progress    Patient Stated       Would like to lose weight and eat healthier       Depression Screen PHQ 2/9 Scores 03/04/2021 10/23/2018 07/31/2017 07/26/2016 07/26/2015 01/28/2014  PHQ - 2 Score 0 0 0 0 0 0  PHQ- 9 Score - - 0 - - -    Fall Risk Fall Risk  03/04/2021 10/23/2018 07/31/2017 07/26/2016 07/26/2015  Falls in the past year? 0 1 No No Yes  Number falls in past yr: 0 0 - - 1  Injury with Fall? 0 0 - - Yes  Risk for fall due to : Other (Comment);No Fall Risks - - - -  Risk for fall due to: Comment tripped - - - -  Follow up Falls prevention discussed - - - -    FALL RISK PREVENTION PERTAINING TO THE HOME:  Any stairs in or around the home? Yes  If so, are there any without handrails? No  Home free of loose throw rugs in walkways, pet beds, electrical cords, etc? Yes  Adequate  lighting in your home to reduce risk of falls? Yes   ASSISTIVE DEVICES UTILIZED TO PREVENT FALLS:  Life alert? No  Use of a cane, walker or w/c? No  Grab bars in the bathroom? No  Shower chair or bench in shower? Yes  Elevated toilet seat or a handicapped toilet? No   TIMED UP AND GO:  Was the test performed? No , visit completed over the phone.    Cognitive Function: Normal cognitive status assessed by this Nurse Health Advisor. No abnormalities found.   MMSE - Mini Mental State Exam 07/31/2017 07/26/2016  Orientation to time 5 5  Orientation to Place 5 5  Registration 3 3  Attention/ Calculation 0 0  Recall 0 0  Recall-comments unable to recall 3 of 3 words pt was unable to recall 3 of 3 words  Language- name 2 objects 0 0  Language- repeat 1 1  Language- follow 3 step command 3 3  Language- read & follow direction 0 0  Write a sentence 0 0  Copy design 0 0  Total score 17 17        Immunizations Immunization History  Administered Date(s) Administered   Fluad Quad(high Dose 65+) 01/08/2020   Hepatitis B 07/22/2008   Influenza Split 01/19/2011, 12/21/2011   Influenza Whole 01/10/2007, 12/25/2008   Influenza, High Dose Seasonal PF 01/10/2017   Influenza,inj,Quad PF,6+ Mos 12/16/2012, 01/28/2014, 02/11/2015, 11/25/2015, 04/03/2018   PFIZER(Purple Top)SARS-COV-2 Vaccination 04/17/2019, 05/08/2019, 12/29/2019   Pfizer Covid-19 Vaccine Bivalent Booster 78yrs & up 12/28/2020   Pneumococcal Conjugate-13 01/28/2014   Pneumococcal Polysaccharide-23 03/27/2004, 01/19/2011   Td 03/27/1998, 07/22/2008    TDAP status: Due, Education has been provided regarding the importance of this vaccine. Advised may receive this vaccine at local pharmacy or Health Dept. Aware to provide a copy of the vaccination record if obtained from local pharmacy or Health Dept. Verbalized acceptance and understanding.  Flu Vaccine status: Due, Education has been provided regarding the importance of this  vaccine. Advised may receive this vaccine at local pharmacy or Health Dept. Aware to provide a copy of the vaccination record if obtained from local pharmacy or Health Dept.  Verbalized acceptance and understanding.  Pneumococcal vaccine status: Up to date  Covid-19 vaccine status: Completed vaccines  Qualifies for Shingles Vaccine? Yes   Zostavax completed No   Shingrix Completed?: No.    Education has been provided regarding the importance of this vaccine. Patient has been advised to call insurance company to determine out of pocket expense if they have not yet received this vaccine. Advised may also receive vaccine at local pharmacy or Health Dept. Verbalized acceptance and understanding.  Screening Tests Health Maintenance  Topic Date Due   Zoster Vaccines- Shingrix (1 of 2) Never done   COLONOSCOPY (Pts 45-60yrs Insurance coverage will need to be confirmed)  08/26/2017   TETANUS/TDAP  07/23/2018   INFLUENZA VACCINE  10/25/2020   Pneumonia Vaccine 26+ Years old  Completed   COVID-19 Vaccine  Completed   HPV VACCINES  Aged Out    Health Maintenance  Health Maintenance Due  Topic Date Due   Zoster Vaccines- Shingrix (1 of 2) Never done   COLONOSCOPY (Pts 45-37yrs Insurance coverage will need to be confirmed)  08/26/2017   TETANUS/TDAP  07/23/2018   INFLUENZA VACCINE  10/25/2020    Colorectal cancer screening: No longer required.   Lung Cancer Screening: (Low Dose CT Chest recommended if Age 59-80 years, 30 pack-year currently smoking OR have quit w/in 15years.) does not qualify.     Additional Screening:  Hepatitis C Screening: does not qualify  Vision Screening: Recommended annual ophthalmology exams for early detection of glaucoma and other disorders of the eye. Is the patient up to date with their annual eye exam?  Yes  Who is the provider or what is the name of the office in which the patient attends annual eye exams? Provider information unavailable.    Dental  Screening: Recommended annual dental exams for proper oral hygiene  Community Resource Referral / Chronic Care Management: CRR required this visit?  No   CCM required this visit?  No      Plan:     I have personally reviewed and noted the following in the patient's chart:   Medical and social history Use of alcohol, tobacco or illicit drugs  Current medications and supplements including opioid prescriptions. Patient is not currently taking opioid prescriptions. Functional ability and status Nutritional status Physical activity Advanced directives List of other physicians Hospitalizations, surgeries, and ER visits in previous 12 months Vitals Screenings to include cognitive, depression, and falls Referrals and appointments  In addition, I have reviewed and discussed with patient certain preventive protocols, quality metrics, and best practice recommendations. A written personalized care plan for preventive services as well as general preventive health recommendations were provided to patient.   Due to this being a telephonic visit, the after visit summary with patients personalized plan was offered to patient via mail or my-chart. Patient would like to access on my-chart.  Loma Messing, LPN   [id number]   Nurse Health Advisor  Nurse Notes: none

## 2021-03-04 ENCOUNTER — Other Ambulatory Visit: Payer: Self-pay | Admitting: Cardiovascular Disease

## 2021-03-04 ENCOUNTER — Ambulatory Visit (INDEPENDENT_AMBULATORY_CARE_PROVIDER_SITE_OTHER): Payer: Medicare Other

## 2021-03-04 VITALS — Ht 68.0 in | Wt 262.0 lb

## 2021-03-04 DIAGNOSIS — Z5181 Encounter for therapeutic drug level monitoring: Secondary | ICD-10-CM

## 2021-03-04 DIAGNOSIS — Z0000 Encounter for general adult medical examination without abnormal findings: Secondary | ICD-10-CM | POA: Diagnosis not present

## 2021-03-04 DIAGNOSIS — I4891 Unspecified atrial fibrillation: Secondary | ICD-10-CM

## 2021-03-04 NOTE — Telephone Encounter (Signed)
Refill request

## 2021-03-04 NOTE — Telephone Encounter (Signed)
Prescription refill request for Eliquis received. Indication: Afib  Last office visit:07/28/20 ([place])  Scr: 1.18 (07/17/19)  Age: 80 Weight: 118.8kg  Labs overdue. Called pt and instructed to have labs drawn in [place] on Monday 03/07/21. Orders placed. Pt verbalized understanding and stated he had enough Eliquis until Tuesday 03/08/21.

## 2021-03-04 NOTE — Patient Instructions (Signed)
[name] , Thank you for taking time to complete your Medicare Wellness Visit. I appreciate your ongoing commitment to your health goals. Please review the following plan we discussed and let me know if I can assist you in the future.   Screening recommendations/referrals: Colonoscopy: no longer required  Recommended yearly ophthalmology/optometry visit for glaucoma screening and checkup Recommended yearly dental visit for hygiene and checkup  Vaccinations: Influenza vaccine: Due-May obtain vaccine at our office or your local pharmacy. Pneumococcal vaccine: up to date Tdap vaccine: due, last completed 07/22/08, Discuss with your local pharmacy Shingles vaccine: Discuss with your local pharmacy   Covid-19:  up to date  Advanced directives: information available at your next visit  Conditions/risks identified: see problem list  Next appointment: Follow up in one year for your annual wellness visit. 03/09/22 @ 10:30am, this will be a telephone visit  Preventive Care 23 Years and Older, Male Preventive care refers to lifestyle choices and visits with your health care provider that can promote health and wellness. What does preventive care include? A yearly physical exam. This is also called an annual well check. Dental exams once or twice a year. Routine eye exams. Ask your health care provider how often you should have your eyes checked. Personal lifestyle choices, including: Daily care of your teeth and gums. Regular physical activity. Eating a healthy diet. Avoiding tobacco and drug use. Limiting alcohol use. Practicing safe sex. Taking low doses of aspirin every day. Taking vitamin and mineral supplements as recommended by your health care provider. What happens during an annual well check? The services and screenings done by your health care provider during your annual well check will depend on your age, overall health, lifestyle risk factors, and family history of  disease. Counseling  Your health care provider may ask you questions about your: Alcohol use. Tobacco use. Drug use. Emotional well-being. Home and relationship well-being. Sexual activity. Eating habits. History of falls. Memory and ability to understand (cognition). Work and work Statistician. Screening  You may have the following tests or measurements: Height, weight, and BMI. Blood pressure. Lipid and cholesterol levels. These may be checked every 5 years, or more frequently if you are over 20 years old. Skin check. Lung cancer screening. You may have this screening every year starting at age 42 if you have a 30-pack-year history of smoking and currently smoke or have quit within the past 15 years. Fecal occult blood test (FOBT) of the stool. You may have this test every year starting at age 82. Flexible sigmoidoscopy or colonoscopy. You may have a sigmoidoscopy every 5 years or a colonoscopy every 10 years starting at age 54. Prostate cancer screening. Recommendations will vary depending on your family history and other risks. Hepatitis C blood test. Hepatitis B blood test. Sexually transmitted disease (STD) testing. Diabetes screening. This is done by checking your blood sugar (glucose) after you have not eaten for a while (fasting). You may have this done every 1-3 years. Abdominal aortic aneurysm (AAA) screening. You may need this if you are a current or former smoker. Osteoporosis. You may be screened starting at age 31 if you are at high risk. Talk with your health care provider about your test results, treatment options, and if necessary, the need for more tests. Vaccines  Your health care provider may recommend certain vaccines, such as: Influenza vaccine. This is recommended every year. Tetanus, diphtheria, and acellular pertussis (Tdap, Td) vaccine. You may need a Td booster every 10 years. Zoster  vaccine. You may need this after age 57. Pneumococcal 13-valent  conjugate (PCV13) vaccine. One dose is recommended after age 61. Pneumococcal polysaccharide (PPSV23) vaccine. One dose is recommended after age 83. Talk to your health care provider about which screenings and vaccines you need and how often you need them. This information is not intended to replace advice given to you by your health care provider. Make sure you discuss any questions you have with your health care provider. Document Released: 04/09/2015 Document Revised: 12/01/2015 Document Reviewed: 01/12/2015 Elsevier Interactive Patient Education  2017 [place] Prevention in the Home Falls can cause injuries. They can happen to people of all ages. There are many things you can do to make your home safe and to help prevent falls. What can I do on the outside of my home? Regularly fix the edges of walkways and driveways and fix any cracks. Remove anything that might make you trip as you walk through a door, such as a raised step or threshold. Trim any bushes or trees on the path to your home. Use bright outdoor lighting. Clear any walking paths of anything that might make someone trip, such as rocks or tools. Regularly check to see if handrails are loose or broken. Make sure that both sides of any steps have handrails. Any raised decks and porches should have guardrails on the edges. Have any leaves, snow, or ice cleared regularly. Use sand or salt on walking paths during winter. Clean up any spills in your garage right away. This includes oil or grease spills. What can I do in the bathroom? Use night lights. Install grab bars by the toilet and in the tub and shower. Do not use towel bars as grab bars. Use non-skid mats or decals in the tub or shower. If you need to sit down in the shower, use a plastic, non-slip stool. Keep the floor dry. Clean up any water that spills on the floor as soon as it happens. Remove soap buildup in the tub or shower regularly. Attach bath mats  securely with double-sided non-slip rug tape. Do not have throw rugs and other things on the floor that can make you trip. What can I do in the bedroom? Use night lights. Make sure that you have a light by your bed that is easy to reach. Do not use any sheets or blankets that are too big for your bed. They should not hang down onto the floor. Have a firm chair that has side arms. You can use this for support while you get dressed. Do not have throw rugs and other things on the floor that can make you trip. What can I do in the kitchen? Clean up any spills right away. Avoid walking on wet floors. Keep items that you use a lot in easy-to-reach places. If you need to reach something above you, use a strong step stool that has a grab bar. Keep electrical cords out of the way. Do not use floor polish or wax that makes floors slippery. If you must use wax, use non-skid floor wax. Do not have throw rugs and other things on the floor that can make you trip. What can I do with my stairs? Do not leave any items on the stairs. Make sure that there are handrails on both sides of the stairs and use them. Fix handrails that are broken or loose. Make sure that handrails are as long as the stairways. Check any carpeting to make sure that it  is firmly attached to the stairs. Fix any carpet that is loose or worn. Avoid having throw rugs at the top or bottom of the stairs. If you do have throw rugs, attach them to the floor with carpet tape. Make sure that you have a light switch at the top of the stairs and the bottom of the stairs. If you do not have them, ask someone to add them for you. What else can I do to help prevent falls? Wear shoes that: Do not have high heels. Have rubber bottoms. Are comfortable and fit you well. Are closed at the toe. Do not wear sandals. If you use a stepladder: Make sure that it is fully opened. Do not climb a closed stepladder. Make sure that both sides of the stepladder  are locked into place. Ask someone to hold it for you, if possible. Clearly mark and make sure that you can see: Any grab bars or handrails. First and last steps. Where the edge of each step is. Use tools that help you move around (mobility aids) if they are needed. These include: Canes. Walkers. Scooters. Crutches. Turn on the lights when you go into a dark area. Replace any light bulbs as soon as they burn out. Set up your furniture so you have a clear path. Avoid moving your furniture around. If any of your floors are uneven, fix them. If there are any pets around you, be aware of where they are. Review your medicines with your doctor. Some medicines can make you feel dizzy. This can increase your chance of falling. Ask your doctor what other things that you can do to help prevent falls. This information is not intended to replace advice given to you by your health care provider. Make sure you discuss any questions you have with your health care provider. Document Released: 01/07/2009 Document Revised: 08/19/2015 Document Reviewed: 04/17/2014 Elsevier Interactive Patient Education  2017 [employer].

## 2021-03-07 ENCOUNTER — Other Ambulatory Visit: Payer: Self-pay

## 2021-03-07 ENCOUNTER — Other Ambulatory Visit
Admission: RE | Admit: 2021-03-07 | Discharge: 2021-03-07 | Disposition: A | Discharge status: Home / Self Care | Payer: Medicare Other | Source: Ambulatory Visit | Attending: Cardiovascular Disease | Admitting: Cardiovascular Disease

## 2021-03-07 DIAGNOSIS — Z5181 Encounter for therapeutic drug level monitoring: Secondary | ICD-10-CM | POA: Diagnosis not present

## 2021-03-07 DIAGNOSIS — I4891 Unspecified atrial fibrillation: Secondary | ICD-10-CM | POA: Diagnosis not present

## 2021-03-07 LAB — CBC
HCT: 46.8 % (ref 39.0–52.0)
Hemoglobin: 15.3 g/dL (ref 13.0–17.0)
MCH: 28.6 pg (ref 26.0–34.0)
MCHC: 32.7 g/dL (ref 30.0–36.0)
MCV: 87.5 fL (ref 80.0–100.0)
Platelets: 186 10*3/uL (ref 150–400)
RBC: 5.35 MIL/uL (ref 4.22–5.81)
RDW: 14.9 % (ref 11.5–15.5)
WBC: 8.3 10*3/uL (ref 4.0–10.5)
nRBC: 0 % (ref 0.0–0.2)

## 2021-03-07 LAB — BASIC METABOLIC PANEL
Anion gap: 8 (ref 5–15)
BUN: 19 mg/dL (ref 8–23)
CO2: 24 mmol/L (ref 22–32)
Calcium: 9.2 mg/dL (ref 8.9–10.3)
Chloride: 105 mmol/L (ref 98–111)
Creatinine, Ser: 0.94 mg/dL (ref 0.61–1.24)
GFR, Estimated: >60 mL/min (ref >60)
Glucose, Bld: 129 mg/dL — ABNORMAL HIGH (ref 70–99)
Potassium: 3.8 mmol/L (ref 3.5–5.1)
Sodium: 137 mmol/L (ref 135–145)

## 2021-03-07 MED ORDER — APIXABAN 5 MG PO TABS: 5.0000 mg | ORAL_TABLET | Freq: Two times a day (BID) | ORAL | 1 refills | Status: DC

## 2021-03-07 NOTE — Telephone Encounter (Signed)
Eliquis 5 mg refill request received. Patient is 80 years old, weight- 118.8 kg, Crea- 0.94 on  03/07/21, Diagnosis- afib, and last seen by Dr. Rockey Situ on 03/07/21. Dose is appropriate based on dosing criteria. Will send in refill to requested pharmacy.

## 2021-03-07 NOTE — Telephone Encounter (Signed)
Refill Request.  

## 2021-03-07 NOTE — Telephone Encounter (Signed)
*  STAT* If patient is at the pharmacy, call can be transferred to refill team.   1. Which medications need to be refilled? (please list name of each medication and dose if known) Eliquis  2. Which pharmacy/location (including street and city if local pharmacy) is medication to be sent to?[facility] [place]  3. Do they need a 30 day or 90 day supply? [place]

## 2021-03-08 ENCOUNTER — Other Ambulatory Visit: Payer: Self-pay | Admitting: *Deleted

## 2021-03-08 NOTE — Telephone Encounter (Signed)
Eliquis 5mg  paper refill request received. Patient is 80 years old, weight-118.8kg, Crea-0.94 on 03/07/2021, Diagnosis-Afib, and last seen by Dr. Rockey Situ on 07/28/2020. Dose is appropriate based on dosing criteria. W  The refill was sent on yesterday to the same pharmacy the paper refill was received & it states 2nd request. Called pharmacy and it was confirmed the refill was received on yesterday. Will not resend.

## 2021-05-04 ENCOUNTER — Telehealth: Payer: Self-pay | Admitting: Cardiovascular Disease

## 2021-05-04 ENCOUNTER — Telehealth: Payer: Self-pay

## 2021-05-04 ENCOUNTER — Other Ambulatory Visit: Payer: Self-pay | Admitting: Cardiovascular Disease

## 2021-05-04 NOTE — Telephone Encounter (Signed)
I spoke with [facility] in [place]. I inquired if the patient's WG had run out on their end as we had sent in Eliquis 5 mg BID # 180 w/ 1 refill on 03/07/21.  Per the pharmacist, he could see that the patient had picked up 2 # 30 day supplies of Eliquis and somehow it was listed as no refills on their end, but he could see our 03/07/21 RX refill and has changed the RX on their end so he has 4 months left on the RX.  Per the pharmacist, he is going to run for a refill now. # 30- day supply is ~ $47 # 90- day supply is ~ $141  I have called the patient and notified him of the above information. He advised he will go ahead and have eliquis refilled for the 90-day RX.   I have asked that he call back with any further questions/ concerns.   The patient was appreciative for the call back.

## 2021-05-04 NOTE — Telephone Encounter (Signed)
Pt c/o medication issue:  1. Name of Medication: eliquis   2. How are you currently taking this medication (dosage and times per day)? 5 mg po BID  3. Are you having a reaction (difficulty breathing--STAT)? No   4. What is your medication issue? Patient notes he is out of refills and wants to know if given increase in cost ( $140 / month) if Dr. Rockey Situ would like him to continue taking.

## 2021-05-04 NOTE — Telephone Encounter (Signed)
Please review

## 2021-05-04 NOTE — Telephone Encounter (Signed)
I spoke with the patient. He called to advise:  1) That his Eliquis RX has run out/ he received a notification this has been cancelled at the pharmacy. - I advised the patient that I will need to call the pharmacy to confirm as we last sent in an Eliquis RX to [facility] in [place] on 03/07/21 for Eliquis 5 mg BID # 180 w/ 1 refill. - the patient is aware he should have enough refills until June   2) The patient was calling to inquire if he needed to continue the eliquis because of the cost of the drug. - He initially advised his drug cost was anywhere from ($47-$141/ month supply) last year. - I advised the patient that the higher co-pay at the end of the year was most likely due to him being in the donut hole, but that his cost for the drug should have reset at the beginning of the year. - The patient was a little hard to follow in conversation regarding the drug cost, but eventually asked if he could just take this once a day. - I advised the patient, he could not take the Eliquis once daily due to this putting him at increased risk for stroke.   I have advised the patient that: 1) He needs to take Eliquis 5 mg BID as prescribed (looking at [place] website, it does look like this is the preferred drug) 2) I will call the pharmacy to follow up on his RX/ cost 3) He may call Eliquis [facility] at [phone] to ask about cost saving options as well  The patient voices understanding of the above recommendations and is agreeable.

## 2021-05-04 NOTE — Chronic Care Management (AMB) (Addendum)
° ° °  Chronic Care Management Pharmacy Assistant   Name: Hunter Young  MRN: [id number] DOB: 06/27/1940  Reason for Encounter: CCM Reminder Call   Conditions to be addressed/monitored: HTN and HLD  Medications: Outpatient Encounter Medications as of 05/04/2021  Medication Sig   apixaban (ELIQUIS) 5 MG TABS tablet Take 1 tablet (5 mg total) by mouth 2 (two) times daily.   atorvastatin (LIPITOR) 40 MG tablet TAKE 1 TABLET BY MOUTH EVERY DAY   COVID-19 mRNA bivalent vaccine, Pfizer, (PFIZER COVID-19 VAC BIVALENT) injection Inject into the muscle.   Multiple Vitamins-Minerals (CENTRUM SILVER 50+MEN) TABS Take by mouth daily.   Multiple Vitamins-Minerals (PRESERVISION AREDS) TABS Take by mouth. Take 1 tablet by mouth twice daily   nitroGLYCERIN (NITROSTAT) 0.4 MG SL tablet Place 1 tablet (0.4 mg total) under the tongue every 5 (five) minutes as needed.   tamsulosin (FLOMAX) 0.4 MG CAPS capsule Take 1 capsule (0.4 mg total) by mouth daily.   Vibegron (GEMTESA) 75 MG TABS Take 75 mg by mouth daily.   No facility-administered encounter medications on file as of 05/04/2021.   [name]  did not answer the telephone to remind of upcoming telephone visit with [name] on 05/09/21 at 3:00pm. Patient was reminded to have all medications, supplements and any blood glucose and blood pressure readings available for review at appointment. If unable to reach, a voicemail was left for patient.    Star Rating Drugs: Medication:  Last Fill: Day Supply Atorvastatin 40mg  12/27/20 [place], CPP notified  Avel Sensor, [place] Assistant [phone]  I have reviewed the care management and care coordination activities outlined in this encounter and I am certifying that I agree with the content of this note. No further action required.  [name], PharmD Clinical Pharmacist [facility] [phone]

## 2021-05-04 NOTE — Telephone Encounter (Signed)
Attempted to contact the patient x 2 attempts. Phone would ring and someone would pick up and hang up.  Will attempt to contact the patient at a later time.

## 2021-05-05 NOTE — Telephone Encounter (Signed)
Prescription refill request for Eliquis received. Indication: afib  Last office visit: 07/28/2020, [place]  Scr: 0.94, 03/07/2021 Age: 81 yo  Weight: 118.8 kg   Refill sent.

## 2021-05-09 ENCOUNTER — Telehealth: Payer: Self-pay

## 2021-05-09 ENCOUNTER — Telehealth: Payer: Medicare Other

## 2021-05-09 NOTE — Progress Notes (Unsigned)
Chronic Care Management Pharmacy Note  05/09/2021 Name:  Hunter Young MRN:  [id number] DOB:  [birth year]  Summary: ***  Recommendations/Changes made from today's visit: ***  Plan: ***   Subjective: Hunter Young is an 81 y.o. year old male who is a primary patient of Copland, Frederico Hamman, MD.  The CCM team was consulted for assistance with disease management and care coordination needs.    {CCMTELEPHONEFACETOFACE:21091510} for {CCMINITIALFOLLOWUPCHOICE:21091511} in response to provider referral for pharmacy case management and/or care coordination services.   Consent to Services:  {CCMCONSENTOPTIONS:25074}  Patient Care Team: Owens Loffler, MD as PCP - General (Family Medicine) Rockey Situ, Kathlene November, MD as Consulting Physician (Cardiology) [name], [facility] as Pharmacist (Pharmacist)  Recent office visits: ***  Recent consult visits: [facility] visits: {Hospital DC Yes/No:25215}   Objective:  Lab Results  Component Value Date   CREATININE 0.94 03/07/2021   BUN 19 03/07/2021   GFR 69.44 10/18/2018   GFRNONAA >60 03/07/2021   GFRAA >60 07/17/2019   NA 137 03/07/2021   K 3.8 03/07/2021   CALCIUM 9.2 03/07/2021   CO2 24 03/07/2021   GLUCOSE 129 (H) 03/07/2021    Lab Results  Component Value Date/Time   HGBA1C 6.1 10/18/2018 03:56 PM   HGBA1C 5.9 04/12/2006 10:01 AM   GFR 69.44 10/18/2018 08:01 AM   GFR 80.16 04/03/2018 10:24 AM    Last diabetic Eye exam: No results found for: HMDIABEYEEXA  Last diabetic Foot exam: No results found for: HMDIABFOOTEX   Lab Results  Component Value Date   CHOL 142 10/18/2018   HDL 44.30 10/18/2018   LDLCALC 82 10/18/2018   TRIG 83.0 10/18/2018   CHOLHDL 3 10/18/2018    Hepatic Function Latest Ref Rng & Units 07/23/2019 07/17/2019 10/18/2018  Total Protein 6.0 - 8.3 g/dL 7.5 6.7 6.2  Albumin 3.5 - 5.2 g/dL 3.7 3.2(L) 3.9  AST 0 - 37 U/L 58(H) 61(H) 27  ALT 0 - 53 U/L 98(H) 83(H) 39  Alk Phosphatase 39 - 117 U/L  300(H) 140(H) 76  Total Bilirubin 0.2 - 1.2 mg/dL 0.5 0.7 0.4  Bilirubin, Direct 0.0 - 0.3 mg/dL 0.2 0.1 0.1    Lab Results  Component Value Date/Time   TSH 1.12 04/03/2018 10:24 AM   TSH 1.43 07/31/2017 08:47 AM    CBC Latest Ref Rng & Units 03/07/2021 07/17/2019 07/03/2019  WBC 4.0 - 10.5 K/uL 8.3 13.1(H) 11.3(H)  Hemoglobin 13.0 - 17.0 g/dL 15.3 15.2 15.1  Hematocrit 39.0 - 52.0 % 46.8 47.0 45.8  Platelets 150 - 400 K/uL 186 196 200    No results found for: VD25OH  Clinical ASCVD: {YES/NO:21197} The ASCVD Risk score (Arnett DK, et al., 2019) failed to calculate for the following reasons:   The 2019 ASCVD risk score is only valid for ages 52 to 87   The patient has a prior MI or stroke diagnosis    Depression screen [facility] 2/9 03/04/2021 10/23/2018 07/31/2017  Decreased Interest 0 0 0  Down, Depressed, Hopeless 0 0 0  PHQ - 2 Score 0 0 0  Altered sleeping - - 0  Tired, decreased energy - - 0  Change in appetite - - 0  Feeling bad or failure about yourself  - - 0  Trouble concentrating - - 0  Moving slowly or fidgety/restless - - 0  Suicidal thoughts - - 0  PHQ-9 Score - - 0  Difficult doing work/chores - - Not difficult at all     ***Other: (  CHADS2VASc if Afib, MMRC or CAT for COPD, ACT, DEXA)  Social History   Tobacco Use  Smoking Status Former   Types: Cigarettes   Quit date: 08/21/1980   Years since quitting: 40.7  Smokeless Tobacco Never   BP Readings from Last 3 Encounters:  09/03/20 (!) 152/82  08/06/20 138/78  07/28/20 122/60   Pulse Readings from Last 3 Encounters:  09/03/20 90  08/06/20 71  07/28/20 71   Wt Readings from Last 3 Encounters:  03/04/21 262 lb (118.8 kg)  09/03/20 261 lb (118.4 kg)  08/06/20 262 lb (118.8 kg)   BMI Readings from Last 3 Encounters:  03/04/21 39.84 kg/m  09/03/20 39.68 kg/m  08/06/20 41.04 kg/m    Assessment/Interventions: Review of patient past medical history, allergies, medications, health status, including  review of consultants reports, laboratory and other test data, was performed as part of comprehensive evaluation and provision of chronic care management services.   SDOH:  (Social Determinants of Health) assessments and interventions performed: {yes/no:20286}  SDOH Screenings   Alcohol Screen: Low Risk    Last Alcohol Screening Score (AUDIT): 2  Depression (PHQ2-9): Low Risk    PHQ-2 Score: 0  Financial Resource Strain: Low Risk    Difficulty of Paying Living Expenses: Not hard at all  Food Insecurity: No Food Insecurity   Worried About Charity fundraiser in the Last Year: Never true   Ran Out of Food in the Last Year: Never true  Housing: Low Risk    Last Housing Risk Score: 0  Physical Activity: Inactive   Days of Exercise per Week: 0 days   Minutes of Exercise per Session: 0 min  Social Connections: Socially Isolated   Frequency of Communication with Friends and Family: Never   Frequency of Social Gatherings with Friends and Family: Never   Attends Religious Services: Never   Marine scientist or Organizations: No   Attends Music therapist: Never   Marital Status: Married  Stress: No Stress Concern Present   Feeling of Stress : Not at all  Tobacco Use: Medium Risk   Smoking Tobacco Use: Former   Smokeless Tobacco Use: Never   Passive Exposure: Not on Pensions consultant Needs: No Transportation Needs   Lack of Transportation (Medical): No   Lack of Transportation (Non-Medical): No    CCM Care Plan  Allergies  Allergen Reactions   Oxycodone-Aspirin Nausea And Vomiting    Medications Reviewed Today     Reviewed by Loma Messing, LPN (Licensed Practical Nurse) on 03/04/21 at 1206  Med List Status: <None>   Medication Order Taking? Sig Documenting Provider Last Dose Status Informant  atorvastatin (LIPITOR) 40 MG tablet [id number] Yes TAKE 1 TABLET BY MOUTH EVERY DAY Gollan, Kathlene November, MD Taking Active   COVID-19 mRNA bivalent vaccine, Pfizer,  (PFIZER COVID-19 VAC BIVALENT) injection [id number] Yes Inject into the muscle. Carlyle Basques, MD Taking Active   ELIQUIS 5 MG TABS tablet [id number] Yes TAKE 1 TABLET(5 MG) BY MOUTH TWICE DAILY Gollan, Kathlene November, MD Taking Active   Multiple Vitamins-Minerals (CENTRUM SILVER 50+MEN) TABS [id number] Yes Take by mouth daily. [provider] Taking Active   Multiple Vitamins-Minerals (PRESERVISION AREDS) TABS [id number] Yes Take by mouth. Take 1 tablet by mouth twice daily [provider] Taking Active Self  nitroGLYCERIN (NITROSTAT) 0.4 MG SL tablet [id number] Yes Place 1 tablet (0.4 mg total) under the tongue every 5 (five) minutes as needed. Minna Merritts, MD Taking Active Self  tamsulosin (FLOMAX) 0.4 MG CAPS capsule [id number] Yes Take 1 capsule (0.4 mg total) by mouth daily. Hollice Espy, MD Taking Active   Vibegron [facility]) 75 MG TABS [id number] Yes Take 75 mg by mouth daily. Hollice Espy, MD Taking Active             Patient Active Problem List   Diagnosis Date Noted   Occipital stroke ([place]) 04/02/2018   OAB (overactive bladder) 03/08/2018   Stenosis of right carotid artery 01/29/2014   Severe obesity (BMI >= 40) ([place]) 04/02/2013   CERVICAL DISORDER, NOS 10/14/2009   GERD 12/06/2007   BPH (benign prostatic hypertrophy) with urinary obstruction 06/25/2007   OSTEOARTHRITIS 06/25/2007   Cardiomegaly 05/30/2007   Allergic rhinitis 04/22/2007   VENOUS INSUFFICIENCY 12/04/2006   Hyperlipidemia LDL goal <70 12/03/2006   Essential hypertension 12/03/2006   Coronary artery disease involving native coronary artery without angina pectoris 12/03/2006   DIVERTICULOSIS OF COLON 05/20/2001    Immunization History  Administered Date(s) Administered   Fluad Quad(high Dose 65+) 01/08/2020   Hepatitis B 07/22/2008   Influenza Split 01/19/2011, 12/21/2011   Influenza Whole 01/10/2007, 12/25/2008   Influenza, High Dose Seasonal PF 01/10/2017   Influenza,inj,Quad PF,6+  Mos 12/16/2012, 01/28/2014, 02/11/2015, 11/25/2015, 04/03/2018   PFIZER(Purple Top)SARS-COV-2 Vaccination 04/17/2019, 05/08/2019, 12/29/2019   Pfizer Covid-19 Vaccine Bivalent Booster 41yr & up 12/28/2020   Pneumococcal Conjugate-13 01/28/2014   Pneumococcal Polysaccharide-23 03/27/2004, 01/19/2011   Td 03/27/1998, 07/22/2008    Conditions to be addressed/monitored:  {USCCMDZASSESSMENTOPTIONS:23563}  There are no care plans that you recently modified to display for this patient.    Medication Assistance: {MEDASSISTANCEINFO:25044}  Compliance/Adherence/Medication fill history: Care Gaps: ***  Star-Rating Drugs: Medication:                Last Fill:         Day Supply Atorvastatin 430m      12/27/20            90  [facility]  [id number] [address]one: [phone]: [phone] Obici Ambulatory Surgery LLCHARMACY [id number] [address]hone: [phone]: [phone] pill box? {Yes or If no, why not?:20788} Pt endorses ***% compliance  We discussed: {Pharmacy options:24294} Patient decided to: {US Pharmacy PlWHKN:18367}Care Plan and Follow Up Patient Decision:  {FOLLOWUP:24991}  Plan: {CM FOLLOW UP PLQVHQ:01642}MiDebbora DusPharmD Clinical Pharmacist Practitioner [phone]

## 2021-05-09 NOTE — Telephone Encounter (Signed)
°  Chronic Care Management   Outreach Note  05/09/2021 Name: Hunter Young MRN: [id number] DOB: 1941/01/12  Referred by: Owens Loffler, MD Reason for referral : Chronic Care Management  Patient had a phone appointment scheduled with clinical pharmacist today. Pt answered the telephone, but  he was unable to hear me very well. I tried to let him know appointment would be cancelled due to patient has not seen Dr. Lorelei Pont in > 12 months (Last visit 01/06/2020). I encouraged him to schedule visit with PCP. I also asked him about any medication concerns and he mentioned cost. He states cardiology office took over the Eliquis PAP, but he is not sure if he was approved. Continues to pick up from [facility] and he is satisfied with this. At this time, we will hold off on CCM contact until PCP visit is scheduled.    [name], PharmD Clinical Pharmacist [facility] at [facility] [phone]

## 2021-07-08 ENCOUNTER — Other Ambulatory Visit: Payer: Self-pay | Admitting: Cardiovascular Disease

## 2021-07-08 NOTE — Telephone Encounter (Signed)
Please schedule 12 month F/U appointment. Thank you! 

## 2021-07-08 NOTE — Telephone Encounter (Signed)
Scheduled

## 2021-07-08 NOTE — Telephone Encounter (Signed)
Attempted to schedule.  

## 2021-08-12 NOTE — Progress Notes (Signed)
Cardiology Office Note  Date:  08/15/2021   ID:  Hunter Young, DOB 04-27-1940, MRN [id number]  PCP:  Hunter Loffler, MD   Chief Complaint  Patient presents with   12 month follow up     Patient c/o shortness of breath with over exertion. Medications reviewed by the patient verbally.     HPI:  Hunter Young is a very pleasant 81 year old gentleman with a history of  Stroke Permanent Atrial fibrillation CAD  stenting to his left circumflex, last catheterization in 2007 showing 40% proximal LAD disease, 70-80% mid RCA disease, nondominant vessel.  <39% b/l carotid disease in 2019 Periodic ETOH abuse He presents for routine followup of his coronary artery disease, atrial fibrillation  Last seen in clinic by myself May 2022  [place] Hard time getting in and out of boat secondary to weight and gait instability  Weight continues to trend up In the past reported having poor diet  Previously reported having a "bad bladder" Nocturia, 4 times a night for quite some time, interrupting his sleep "I cut out booze" quite a while ago  Denies shortness of breath or chest pain on exertion  No recent episodes of chest pain/angina No regular exercise or walking program Legs weak  No recent trips to the emergency room  EKG personally reviewed by myself on todays visit Shows atrial fibrillation rate 96 bpm PVCs  Other past medical history reviewed In the Er 07/17/2019, chest pain  5 weeks he has been experiencing intermittent discomfort in his left-sided chest and abdomen.  Troponin neg x 2 RUQ u/s normal  Other past medical history reviewed Echocardiogram 10/2017  reviewed mildly depressed ejection fraction 45 to 50%  moderately dilated left atrium  Seen in the office with Hunter Young 10/10/2017 Had stroke type symptoms  speech, left hand and arm  MRI of the brain (10/17/17)  showed an acute infarction in the right occipital lobe Atrial fib noted on  echo 11/01/2017 Started on eliquis 11/01/2017   PMH:   has a past medical history of ALLERGIC RHINITIS (04/22/2007), BENIGN PROSTATIC HYPERTROPHY, WITH URINARY OBSTRUCTION (06/25/2007), Cardiomegaly (05/30/2007), Carotid stenosis (01/29/2014), CERVICAL DISORDER, NOS (10/14/2009), CORONARY ARTERY DISEASE (12/03/2006), DIVERTICULOSIS OF COLON (05/20/2001), Dyshidrosis (06/25/2007), GERD (12/06/2007), HYPERLIPIDEMIA (12/03/2006), INTERNAL HEMORRHOIDS (05/20/2001), OBESITY, MODERATE (12/05/2008), OSTEOARTHRITIS (06/25/2007), and VENOUS INSUFFICIENCY (12/04/2006).  PSH:    Past Surgical History:  Procedure Laterality Date   ANGIOPLASTY     2 stents   FOOT SURGERY     JOINT REPLACEMENT     bilat knee   TONSILLECTOMY      Current Outpatient Medications  Medication Sig Dispense Refill   apixaban (ELIQUIS) 5 MG TABS tablet TAKE 1 TABLET(5 MG) BY MOUTH TWICE DAILY 60 tablet 5   Multiple Vitamins-Minerals (CENTRUM SILVER 50+MEN) TABS Take by mouth daily.     Multiple Vitamins-Minerals (PRESERVISION AREDS) TABS Take by mouth. Take 1 tablet by mouth twice daily     nitroGLYCERIN (NITROSTAT) 0.4 MG SL tablet Place 1 tablet (0.4 mg total) under the tongue every 5 (five) minutes as needed. 25 tablet 6   tamsulosin (FLOMAX) 0.4 MG CAPS capsule Take 1 capsule (0.4 mg total) by mouth daily. 30 capsule 11   Vibegron (GEMTESA) 75 MG TABS Take 75 mg by mouth daily. 30 tablet 11   atorvastatin (LIPITOR) 40 MG tablet TAKE 1 TABLET BY MOUTH EVERY DAY 90 tablet 3   COVID-19 mRNA bivalent vaccine, Pfizer, (PFIZER COVID-19 VAC BIVALENT) injection Inject into  the muscle. (Patient not taking: Reported on 08/15/2021) 0.3 mL 0   No current facility-administered medications for this visit.    Allergies:   Oxycodone-aspirin   Social History:  The patient  reports that he quit smoking about 41 years ago. His smoking use included cigarettes. He has never used smokeless tobacco. He reports current alcohol use. He reports that he does not use  drugs.   Family History:   family history includes Colon cancer (age of onset: 13) in his mother.    Review of Systems: Review of Systems  Constitutional: Negative.   HENT: Negative.    Respiratory: Negative.    Cardiovascular: Negative.   Gastrointestinal: Negative.   Musculoskeletal: Negative.   Neurological: Negative.   Psychiatric/Behavioral: Negative.    All other systems reviewed and are negative.  PHYSICAL EXAM: VS:  BP 130/74 (BP Location: Left Arm, Patient Position: Sitting, Cuff Size: Normal)   Pulse 96   Ht 5' 7.5" (1.715 m)   Wt 267 lb 6 oz (121.3 kg)   SpO2 96%   BMI 41.26 kg/m  , BMI Body mass index is 41.26 kg/m. Constitutional:  oriented to person, place, and time. No distress.  HENT:  Head: Grossly normal Eyes:  no discharge. No scleral icterus.  Neck: No JVD, no carotid bruits  Cardiovascular: Irregularly irregular no murmurs appreciated Pulmonary/Chest: Clear to auscultation bilaterally, no wheezes or rails Abdominal: Soft.  no distension.  no tenderness.  Musculoskeletal: Normal range of motion Neurological:  normal muscle tone. Coordination normal. No atrophy Skin: Skin warm and dry Psychiatric: normal affect, pleasant   Recent Labs: 03/07/2021: BUN 19; Creatinine, Ser 0.94; Hemoglobin 15.3; Platelets 186; Potassium 3.8; Sodium 137    Lipid Panel Lab Results  Component Value Date   CHOL 142 10/18/2018   HDL 44.30 10/18/2018   LDLCALC 82 10/18/2018   TRIG 83.0 10/18/2018    Wt Readings from Last 3 Encounters:  08/15/21 267 lb 6 oz (121.3 kg)  03/04/21 262 lb (118.8 kg)  09/03/20 261 lb (118.4 kg)     ASSESSMENT AND PLAN:  Persistent atrial fibrillation Tolerating Eliquis twice daily Will add metoprolol succinate 25 mg daily for better rate control  Hyperlipidemia LDL goal <70 - Plan: EKG 12-Lead Lipids ordered today with LFTs  Essential hypertension - Plan: EKG 12-Lead We will add metoprolol for rate control as above  Coronary  artery disease involving native coronary artery of native heart without angina pectoris -  Currently with no symptoms of angina. No further workup at this time. Continue current medication regimen.  Stenosis of right carotid artery - Plan: EKG 12-Lead 1-39% stenosis bilaterally August 2019 Continue Lipitor Medical management at this time  Severe obesity (BMI >= 40) ([place]) - Plan: EKG 12-Lead We have encouraged continued exercise, careful diet management in an effort to lose weight.  Discussed low carbohydrate diet    Total encounter time more than 30 minutes  Greater than 50% was spent in counseling and coordination of care with the patient    Orders Placed This Encounter  Procedures   EKG 12-Lead     Signed, Esmond Plants, M.D., Ph.D. 08/15/2021  [place], [place]

## 2021-08-15 ENCOUNTER — Encounter: Payer: Self-pay | Admitting: Cardiovascular Disease

## 2021-08-15 ENCOUNTER — Other Ambulatory Visit
Admission: RE | Admit: 2021-08-15 | Discharge: 2021-08-15 | Disposition: A | Discharge status: Home / Self Care | Payer: Medicare Other | Attending: Cardiovascular Disease | Admitting: Cardiovascular Disease

## 2021-08-15 ENCOUNTER — Ambulatory Visit: Payer: Medicare Other | Admitting: Cardiovascular Disease

## 2021-08-15 VITALS — BP 130/74 | HR 96 | Ht 67.5 in | Wt 267.4 lb

## 2021-08-15 DIAGNOSIS — I10 Essential (primary) hypertension: Secondary | ICD-10-CM

## 2021-08-15 DIAGNOSIS — I739 Peripheral vascular disease, unspecified: Secondary | ICD-10-CM | POA: Diagnosis not present

## 2021-08-15 DIAGNOSIS — I6310 Cerebral infarction due to embolism of unspecified precerebral artery: Secondary | ICD-10-CM

## 2021-08-15 DIAGNOSIS — E785 Hyperlipidemia, unspecified: Secondary | ICD-10-CM | POA: Diagnosis not present

## 2021-08-15 DIAGNOSIS — I25118 Atherosclerotic heart disease of native coronary artery with other forms of angina pectoris: Secondary | ICD-10-CM

## 2021-08-15 DIAGNOSIS — I4819 Other persistent atrial fibrillation: Secondary | ICD-10-CM

## 2021-08-15 LAB — LIPID PANEL
Cholesterol: 156 mg/dL (ref 0–200)
HDL: 52 mg/dL (ref >40)
LDL Cholesterol: 83 mg/dL (ref 0–99)
Total CHOL/HDL Ratio: 3 RATIO
Triglycerides: 105 mg/dL (ref <150)
VLDL: 21 mg/dL (ref 0–40)

## 2021-08-15 LAB — HEPATIC FUNCTION PANEL
ALT: 22 U/L (ref 0–44)
AST: 27 U/L (ref 15–41)
Albumin: 3.6 g/dL (ref 3.5–5.0)
Alkaline Phosphatase: 92 U/L (ref 38–126)
Bilirubin, Direct: 0.2 mg/dL (ref 0.0–0.2)
Indirect Bilirubin: 1 mg/dL — ABNORMAL HIGH (ref 0.3–0.9)
Total Bilirubin: 1.2 mg/dL (ref 0.3–1.2)
Total Protein: 7.2 g/dL (ref 6.5–8.1)

## 2021-08-15 MED ORDER — ATORVASTATIN CALCIUM 40 MG PO TABS: ORAL_TABLET | ORAL | 3 refills | Status: DC

## 2021-08-15 MED ORDER — METOPROLOL SUCCINATE ER 25 MG PO TB24: 25.0000 mg | ORAL_TABLET | Freq: Every day | ORAL | 3 refills | Status: DC

## 2021-08-15 NOTE — Patient Instructions (Addendum)
Medication Instructions:  Please add metoprolol succinate 25 mg daily  If you need a refill on your cardiac medications before your next appointment, please call your pharmacy.   Lab work:  Today: Lipids, LFTs  Medical Mall Entrance at [facility] 1st desk on the right to check in (REGISTRATION)  Lab hours: Monday- Friday (7:30 am- 5:30 pm)   Testing/Procedures: No new testing needed  Follow-Up: At [facility], you and your health needs are our priority.  As part of our continuing mission to provide you with exceptional heart care, we have created designated Provider Care Teams.  These Care Teams include your primary Cardiologist (physician) and Advanced Practice Providers (APPs -  Physician Assistants and Nurse Practitioners) who all work together to provide you with the care you need, when you need it.  You will need a follow up appointment in 12 months  Providers on your designated Care Team:   Murray Hodgkins, NP Christell Faith, PA-C Cadence Kathlen Mody, [place]  COVID-19 Vaccine Information can be found at: [email or web address].uk For questions related to vaccine distribution or appointments, please email [email or web address] .[email or web address] or call 3198602555.

## 2021-08-16 ENCOUNTER — Telehealth: Payer: Self-pay | Admitting: Emergency Medicine

## 2021-08-16 MED ORDER — EZETIMIBE 10 MG PO TABS: 10.0000 mg | ORAL_TABLET | Freq: Every day | ORAL | 3 refills | Status: DC

## 2021-08-16 NOTE — Telephone Encounter (Signed)
Called patient. No answer. Detailed message left. Patient had viewed results and comments from MD on Mychart.   Zetia sent into pharmacy.   Advised patient that he may call with any questions or sent MyChart message.

## 2021-08-16 NOTE — Telephone Encounter (Signed)
-----   Message from Minna Merritts, MD sent at 08/15/2021  4:53 PM EDT ----- Cholesterol Slightly above goal,  would add Zetia 10 mg daily to the Lipitor 40 daily

## 2021-08-16 NOTE — Telephone Encounter (Signed)
Spoke with patient. Patient wanted to get clarification on the medication that was new and sent in today.   Explained that zetia was sent in today and how it works and dosing instructions. Pt verbalized understanding and voiced appreciation for the call.

## 2021-09-02 ENCOUNTER — Ambulatory Visit: Payer: Self-pay | Admitting: Urology

## 2021-09-16 ENCOUNTER — Other Ambulatory Visit: Payer: Self-pay | Admitting: *Deleted

## 2021-09-16 MED ORDER — TAMSULOSIN HCL 0.4 MG PO CAPS: 0.4000 mg | ORAL_CAPSULE | Freq: Every day | ORAL | 0 refills | Status: DC

## 2021-10-14 ENCOUNTER — Ambulatory Visit: Payer: Medicare Other | Admitting: Urology

## 2022-03-09 ENCOUNTER — Ambulatory Visit (INDEPENDENT_AMBULATORY_CARE_PROVIDER_SITE_OTHER): Payer: Medicare Other

## 2022-03-09 VITALS — Ht 67.5 in | Wt 267.0 lb

## 2022-03-09 DIAGNOSIS — Z0000 Encounter for general adult medical examination without abnormal findings: Secondary | ICD-10-CM

## 2022-03-09 NOTE — Patient Instructions (Signed)
[name] , Thank you for taking time to come for your Medicare Wellness Visit. I appreciate your ongoing commitment to your health goals. Please review the following plan we discussed and let me know if I can assist you in the future.   Screening recommendations/referrals: Colonoscopy: aged out Recommended yearly ophthalmology/optometry visit for glaucoma screening and checkup Recommended yearly dental visit for hygiene and checkup  Vaccinations: Influenza vaccine: has had this season, need date Pneumococcal vaccine: 01/28/14 Tdap vaccine: 07/22/08, due if have injury Shingles vaccine: n/d   Covid-19: 04/17/19, 05/08/19, 12/29/19, 12/28/20  Advanced directives: no  Conditions/risks identified: none  Next appointment: Follow up in one year for your annual wellness visit. 03/12/23 @ 9 am by phone  Preventive Care 65 Years and Older, Male Preventive care refers to lifestyle choices and visits with your health care provider that can promote health and wellness. What does preventive care include? A yearly physical exam. This is also called an annual well check. Dental exams once or twice a year. Routine eye exams. Ask your health care provider how often you should have your eyes checked. Personal lifestyle choices, including: Daily care of your teeth and gums. Regular physical activity. Eating a healthy diet. Avoiding tobacco and drug use. Limiting alcohol use. Practicing safe sex. Taking low doses of aspirin every day. Taking vitamin and mineral supplements as recommended by your health care provider. What happens during an annual well check? The services and screenings done by your health care provider during your annual well check will depend on your age, overall health, lifestyle risk factors, and family history of disease. Counseling  Your health care provider may ask you questions about your: Alcohol use. Tobacco use. Drug use. Emotional well-being. Home and relationship  well-being. Sexual activity. Eating habits. History of falls. Memory and ability to understand (cognition). Work and work Statistician. Screening  You may have the following tests or measurements: Height, weight, and BMI. Blood pressure. Lipid and cholesterol levels. These may be checked every 5 years, or more frequently if you are over 43 years old. Skin check. Lung cancer screening. You may have this screening every year starting at age 11 if you have a 30-pack-year history of smoking and currently smoke or have quit within the past 15 years. Fecal occult blood test (FOBT) of the stool. You may have this test every year starting at age 52. Flexible sigmoidoscopy or colonoscopy. You may have a sigmoidoscopy every 5 years or a colonoscopy every 10 years starting at age 29. Prostate cancer screening. Recommendations will vary depending on your family history and other risks. Hepatitis C blood test. Hepatitis B blood test. Sexually transmitted disease (STD) testing. Diabetes screening. This is done by checking your blood sugar (glucose) after you have not eaten for a while (fasting). You may have this done every 1-3 years. Abdominal aortic aneurysm (AAA) screening. You may need this if you are a current or former smoker. Osteoporosis. You may be screened starting at age 74 if you are at high risk. Talk with your health care provider about your test results, treatment options, and if necessary, the need for more tests. Vaccines  Your health care provider may recommend certain vaccines, such as: Influenza vaccine. This is recommended every year. Tetanus, diphtheria, and acellular pertussis (Tdap, Td) vaccine. You may need a Td booster every 10 years. Zoster vaccine. You may need this after age 82. Pneumococcal 13-valent conjugate (PCV13) vaccine. One dose is recommended after age 32. Pneumococcal polysaccharide (PPSV23) vaccine. One  dose is recommended after age 28. Talk to your health care  provider about which screenings and vaccines you need and how often you need them. This information is not intended to replace advice given to you by your health care provider. Make sure you discuss any questions you have with your health care provider. Document Released: 04/09/2015 Document Revised: 12/01/2015 Document Reviewed: 01/12/2015 Elsevier Interactive Patient Education  2017 [place] Prevention in the Home Falls can cause injuries. They can happen to people of all ages. There are many things you can do to make your home safe and to help prevent falls. What can I do on the outside of my home? Regularly fix the edges of walkways and driveways and fix any cracks. Remove anything that might make you trip as you walk through a door, such as a raised step or threshold. Trim any bushes or trees on the path to your home. Use bright outdoor lighting. Clear any walking paths of anything that might make someone trip, such as rocks or tools. Regularly check to see if handrails are loose or broken. Make sure that both sides of any steps have handrails. Any raised decks and porches should have guardrails on the edges. Have any leaves, snow, or ice cleared regularly. Use sand or salt on walking paths during winter. Clean up any spills in your garage right away. This includes oil or grease spills. What can I do in the bathroom? Use night lights. Install grab bars by the toilet and in the tub and shower. Do not use towel bars as grab bars. Use non-skid mats or decals in the tub or shower. If you need to sit down in the shower, use a plastic, non-slip stool. Keep the floor dry. Clean up any water that spills on the floor as soon as it happens. Remove soap buildup in the tub or shower regularly. Attach bath mats securely with double-sided non-slip rug tape. Do not have throw rugs and other things on the floor that can make you trip. What can I do in the bedroom? Use night lights. Make  sure that you have a light by your bed that is easy to reach. Do not use any sheets or blankets that are too big for your bed. They should not hang down onto the floor. Have a firm chair that has side arms. You can use this for support while you get dressed. Do not have throw rugs and other things on the floor that can make you trip. What can I do in the kitchen? Clean up any spills right away. Avoid walking on wet floors. Keep items that you use a lot in easy-to-reach places. If you need to reach something above you, use a strong step stool that has a grab bar. Keep electrical cords out of the way. Do not use floor polish or wax that makes floors slippery. If you must use wax, use non-skid floor wax. Do not have throw rugs and other things on the floor that can make you trip. What can I do with my stairs? Do not leave any items on the stairs. Make sure that there are handrails on both sides of the stairs and use them. Fix handrails that are broken or loose. Make sure that handrails are as long as the stairways. Check any carpeting to make sure that it is firmly attached to the stairs. Fix any carpet that is loose or worn. Avoid having throw rugs at the top or bottom of the  stairs. If you do have throw rugs, attach them to the floor with carpet tape. Make sure that you have a light switch at the top of the stairs and the bottom of the stairs. If you do not have them, ask someone to add them for you. What else can I do to help prevent falls? Wear shoes that: Do not have high heels. Have rubber bottoms. Are comfortable and fit you well. Are closed at the toe. Do not wear sandals. If you use a stepladder: Make sure that it is fully opened. Do not climb a closed stepladder. Make sure that both sides of the stepladder are locked into place. Ask someone to hold it for you, if possible. Clearly mark and make sure that you can see: Any grab bars or handrails. First and last steps. Where the  edge of each step is. Use tools that help you move around (mobility aids) if they are needed. These include: Canes. Walkers. Scooters. Crutches. Turn on the lights when you go into a dark area. Replace any light bulbs as soon as they burn out. Set up your furniture so you have a clear path. Avoid moving your furniture around. If any of your floors are uneven, fix them. If there are any pets around you, be aware of where they are. Review your medicines with your doctor. Some medicines can make you feel dizzy. This can increase your chance of falling. Ask your doctor what other things that you can do to help prevent falls. This information is not intended to replace advice given to you by your health care provider. Make sure you discuss any questions you have with your health care provider. Document Released: 01/07/2009 Document Revised: 08/19/2015 Document Reviewed: 04/17/2014 Elsevier Interactive Patient Education  2017 [employer].

## 2022-03-09 NOTE — Progress Notes (Signed)
Virtual Visit via Telephone Note  I connected with  Hunter Young on 03/09/22 at  9:00 AM EST by telephone and verified that I am speaking with the correct person using two identifiers.Talked to his wife.  Location: Patient: home Provider: [place] Persons participating in the virtual visit: [place]   I discussed the limitations, risks, security and privacy concerns of performing an evaluation and management service by telephone and the availability of in person appointments. The patient expressed understanding and agreed to proceed.  Interactive audio and video telecommunications were attempted between this nurse and patient, however failed, due to patient having technical difficulties OR patient did not have access to video capability.  We continued and completed visit with audio only.  Some vital signs may be absent or patient reported.   Dionisio Azir, LPN  Subjective:   Hunter Young is a 81 y.o. male who presents for an Initial Medicare Annual Wellness Visit.  Review of Systems     Cardiac Risk Factors include: advanced age (>19mn, >>63women);dyslipidemia;hypertension;male gender     Objective:    There were no vitals filed for this visit. There is no height or weight on file to calculate BMI.     03/09/2022    9:04 AM 03/04/2021   12:09 PM 07/17/2019    4:03 PM 07/03/2019   12:57 PM 07/31/2017    8:59 AM 07/26/2016    8:42 AM  Advanced Directives  Does Patient Have a Medical Advance Directive? No No No No No No  Would patient like information on creating a medical advance directive? No - Patient declined Yes (MAU/Ambulatory/Procedural Areas - Information given)   No - Patient declined     Current Medications (verified) Outpatient Encounter Medications as of 03/09/2022  Medication Sig   apixaban (ELIQUIS) 5 MG TABS tablet TAKE 1 TABLET(5 MG) BY MOUTH TWICE DAILY   atorvastatin (LIPITOR) 40 MG tablet TAKE 1 TABLET BY MOUTH EVERY DAY    COVID-19 mRNA bivalent vaccine, Pfizer, (PFIZER COVID-19 VAC BIVALENT) injection Inject into the muscle.   ezetimibe (ZETIA) 10 MG tablet Take 1 tablet (10 mg total) by mouth daily.   metoprolol succinate (TOPROL-XL) 25 MG 24 hr tablet Take 1 tablet (25 mg total) by mouth daily. Take with or immediately following a meal.   Multiple Vitamins-Minerals (CENTRUM SILVER 50+MEN) TABS Take by mouth daily.   Multiple Vitamins-Minerals (PRESERVISION AREDS) TABS Take by mouth. Take 1 tablet by mouth twice daily   nitroGLYCERIN (NITROSTAT) 0.4 MG SL tablet Place 1 tablet (0.4 mg total) under the tongue every 5 (five) minutes as needed.   tamsulosin (FLOMAX) 0.4 MG CAPS capsule Take 1 capsule (0.4 mg total) by mouth daily. (Patient not taking: Reported on 03/09/2022)   Vibegron (GEMTESA) 75 MG TABS Take 75 mg by mouth daily. (Patient not taking: Reported on 03/09/2022)   No facility-administered encounter medications on file as of 03/09/2022.    Allergies (verified) Oxycodone-aspirin   History: Past Medical History:  Diagnosis Date   ALLERGIC RHINITIS 04/22/2007   BENIGN PROSTATIC HYPERTROPHY, WITH URINARY OBSTRUCTION 06/25/2007   Cardiomegaly 05/30/2007   Carotid stenosis 01/29/2014   CERVICAL DISORDER, NOS 10/14/2009   CORONARY ARTERY DISEASE 12/03/2006   DIVERTICULOSIS OF COLON 05/20/2001   Dyshidrosis 06/25/2007   GERD 12/06/2007   HYPERLIPIDEMIA 12/03/2006   INTERNAL HEMORRHOIDS 05/20/2001   OBESITY, MODERATE 12/05/2008   OSTEOARTHRITIS 06/25/2007   VENOUS INSUFFICIENCY 12/04/2006   Past Surgical History:  Procedure Laterality Date  ANGIOPLASTY     2 stents   FOOT SURGERY     JOINT REPLACEMENT     bilat knee   TONSILLECTOMY     Family History  Problem Relation Age of Onset   Colon cancer Mother 30   Social History   Socioeconomic History   Marital status: Married    Spouse name: Not on file   Number of children: Not on file   Years of education: Not on file   Highest education level: Not  on file  Occupational History   Not on file  Tobacco Use   Smoking status: Former    Types: Cigarettes    Quit date: 08/21/1980    Years since quitting: 41.5   Smokeless tobacco: Never  Vaping Use   Vaping Use: Never used  Substance and Sexual Activity   Alcohol use: Yes    Comment: occasional 1-2 drinks   Drug use: No   Sexual activity: Not Currently  Other Topics Concern   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       Works part time for [employer].    Runs hub to [place], [place]. 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    In trucking business for a long time.   Social Determinants of Health   Financial Resource Strain: Low Risk  (03/09/2022)   Overall Financial Resource Strain (CARDIA)    Difficulty of Paying Living Expenses: Not hard at all  Food Insecurity: No Food Insecurity (03/09/2022)   Hunger Vital Sign    Worried About Running Out of Food in the Last Year: Never true    Ran Out of Food in the Last Year: Never true  Transportation Needs: No Transportation Needs (03/09/2022)   PRAPARE - Hydrologist (Medical): No    Lack of Transportation (Non-Medical): No  Physical Activity: Inactive (03/09/2022)   Exercise Vital Sign    Days of Exercise per Week: 0 days    Minutes of Exercise per Session: 0 min  Stress: No Stress Concern Present (03/09/2022)   Kenn[place]    Feeling of Stress : Not at all  Social Connections: Moderately Isolated (03/09/2022)   Social Connection and Isolation Panel [NHANES]    Frequency of Communication with Friends and Family: More than three times a week    Frequency of Social Gatherings with Friends and Family: Never    Attends Religious Services: Never    Printmaker: No    Attends Music therapist: Never    Marital Status: Married    Tobacco Counseling Counseling  given: Not Answered   Clinical Intake:  Pre-visit preparation completed: Yes  Pain : No/denies pain     Nutritional Risks: None Diabetes: No  How often do you need to have someone help you when you read instructions, pamphlets, or other written materials from your doctor or pharmacy?: 1 - Never  Diabetic?no  Interpreter Needed?: No  Information entered by :: Kirke Shaggy, LPN   Activities of Daily Living    03/09/2022    9:05 AM  In your present state of health, do you have any difficulty performing the following activities:  Hearing? 1  Vision? 0  Difficulty concentrating or making decisions? 0  Walking or climbing stairs? 0  Dressing or bathing? 0  Doing errands, shopping? 0  Preparing Food and eating ?  N  Using the Toilet? N  In the past six months, have you accidently leaked urine? N  Do you have problems with loss of bowel control? N  Managing your Medications? N  Managing your Finances? N  Housekeeping or managing your Housekeeping? N    Patient Care Team: Owens Loffler, MD as PCP - General (Family Medicine) Rockey Situ Kathlene November, MD as Consulting Physician (Cardiology) [name], [facility] as Pharmacist (Pharmacist)  Indicate any recent Medical Services you may have received from other than [facility] providers in the past year (date may be approximate).     Assessment:   This is a routine wellness examination for Hunter Young.  Hearing/Vision screen Hearing Screening - Comments:: Wears aids Vision Screening - Comments:: Readers- MD in [place] issues and exercise activities discussed: Current Exercise Habits: The patient does not participate in regular exercise at present   Goals Addressed             This Visit's Progress    DIET - EAT MORE FRUITS AND VEGETABLES         Depression Screen    03/09/2022    9:03 AM 03/04/2021   12:18 PM 10/23/2018    9:31 AM 07/31/2017    8:44 AM 07/26/2016    8:42 AM 07/26/2015    8:40 AM 01/28/2014     8:03 AM  PHQ 2/9 Scores  PHQ - 2 Score 0 0 0 0 0 0 0  PHQ- 9 Score 0   0       Fall Risk    03/09/2022    9:05 AM 03/04/2021   12:16 PM 10/23/2018    9:31 AM 07/31/2017    8:44 AM 07/26/2016    8:42 AM  Fall Risk   Falls in the past year? 0 0 1 No No  Number falls in past yr: 0 0 0    Injury with Fall? 0 0 0    Risk for fall due to : No Fall Risks Other (Comment);No Fall Risks     Risk for fall due to: Comment  tripped     Follow up Falls prevention discussed;Falls evaluation completed Falls prevention discussed       FALL RISK PREVENTION PERTAINING TO THE HOME:  Any stairs in or around the home? Yes  If so, are there any without handrails? No  Home free of loose throw rugs in walkways, pet beds, electrical cords, etc? Yes  Adequate lighting in your home to reduce risk of falls? Yes   ASSISTIVE DEVICES UTILIZED TO PREVENT FALLS:  Life alert? No  Use of a cane, walker or w/c? No  Grab bars in the bathroom? No  Shower chair or bench in shower? No  Elevated toilet seat or a handicapped toilet? No   Cognitive Function:pt unavailable    07/31/2017    8:44 AM 07/26/2016    8:46 AM  MMSE - Mini Mental State Exam  Orientation to time 5 5  Orientation to Place 5 5  Registration 3 3  Attention/ Calculation 0 0  Recall 0 0  Recall-comments unable to recall 3 of 3 words pt was unable to recall 3 of 3 words  Language- name 2 objects 0 0  Language- repeat 1 1  Language- follow 3 step command 3 3  Language- read & follow direction 0 0  Write a sentence 0 0  Copy design 0 0  Total score 17 17        Immunizations Immunization  History  Administered Date(s) Administered   Fluad Quad(high Dose 65+) 01/08/2020   Hepatitis B 07/22/2008   Influenza Split 01/19/2011, 12/21/2011   Influenza Whole 01/10/2007, 12/25/2008   Influenza, High Dose Seasonal PF 01/10/2017   Influenza,inj,Quad PF,6+ Mos 12/16/2012, 01/28/2014, 02/11/2015, 11/25/2015, 04/03/2018   PFIZER(Purple  Top)SARS-COV-2 Vaccination 04/17/2019, 05/08/2019, 12/29/2019   Pfizer Covid-19 Vaccine Bivalent Booster 51yr & up 12/28/2020   Pneumococcal Conjugate-13 01/28/2014   Pneumococcal Polysaccharide-23 03/27/2004, 01/19/2011   Td 03/27/1998, 07/22/2008    TDAP status: Due, Education has been provided regarding the importance of this vaccine. Advised may receive this vaccine at local pharmacy or Health Dept. Aware to provide a copy of the vaccination record if obtained from local pharmacy or Health Dept. Verbalized acceptance and understanding.  Flu Vaccine status: Up to date  Pneumococcal vaccine status: Up to date  Covid-19 vaccine status: Completed vaccines  Qualifies for Shingles Vaccine? Yes   Zostavax completed No   Shingrix Completed?: No.    Education has been provided regarding the importance of this vaccine. Patient has been advised to call insurance company to determine out of pocket expense if they have not yet received this vaccine. Advised may also receive vaccine at local pharmacy or Health Dept. Verbalized acceptance and understanding.  Screening Tests Health Maintenance  Topic Date Due   Zoster Vaccines- Shingrix (1 of 2) Never done   COLONOSCOPY (Pts 45-422yrInsurance coverage will need to be confirmed)  08/26/2017   DTaP/Tdap/Td (3 - Tdap) 07/23/2018   INFLUENZA VACCINE  10/25/2021   COVID-19 Vaccine (5 - 2023-24 season) 11/25/2021   Medicare Annual Wellness (AWV)  03/10/2023   Pneumonia Vaccine [age]Years old  Completed   HPV VACCINES  Aged Out    Health Maintenance  Health Maintenance Due  Topic Date Due   Zoster Vaccines- Shingrix (1 of 2) Never done   COLONOSCOPY (Pts 45-4946yrnsurance coverage will need to be confirmed)  08/26/2017   DTaP/Tdap/Td (3 - Tdap) 07/23/2018   INFLUENZA VACCINE  10/25/2021   COVID-19 Vaccine (5 - 2023-24 season) 11/25/2021    Colorectal cancer screening: No longer required.   Lung Cancer Screening: (Low Dose CT Chest  recommended if Age 47-40-80ars, 30 pack-year currently smoking OR have quit w/in 15years.) does not qualify.   Additional Screening:  Hepatitis C Screening: does not qualify; Completed no  Vision Screening: Recommended annual ophthalmology exams for early detection of glaucoma and other disorders of the eye. Is the patient up to date with their annual eye exam?  Yes  Who is the provider or what is the name of the office in which the patient attends annual eye exams? [facility] [place] pt is not established with a provider, would they like to be referred to a provider to establish care? No .   Dental Screening: Recommended annual dental exams for proper oral hygiene  Community Resource Referral / Chronic Care Management: CRR required this visit?  No   CCM required this visit?  No      Plan:     I have personally reviewed and noted the following in the patient's chart:   Medical and social history Use of alcohol, tobacco or illicit drugs  Current medications and supplements including opioid prescriptions. Patient is not currently taking opioid prescriptions. Functional ability and status Nutritional status Physical activity Advanced directives List of other physicians Hospitalizations, surgeries, and ER visits in previous 12 months Vitals Screenings to include cognitive, depression, and falls Referrals and appointments  In addition, I have reviewed and discussed with patient certain preventive protocols, quality metrics, and best practice recommendations. A written personalized care plan for preventive services as well as general preventive health recommendations were provided to patient.     Dionisio Per, LPN   [id number]   Nurse Notes: none

## 2022-04-14 DIAGNOSIS — H903 Sensorineural hearing loss, bilateral: Secondary | ICD-10-CM | POA: Diagnosis not present

## 2022-04-14 DIAGNOSIS — H6123 Impacted cerumen, bilateral: Secondary | ICD-10-CM | POA: Diagnosis not present

## 2022-05-14 ENCOUNTER — Other Ambulatory Visit: Payer: Self-pay | Admitting: Cardiovascular Disease

## 2022-05-14 DIAGNOSIS — I4819 Other persistent atrial fibrillation: Secondary | ICD-10-CM

## 2022-05-15 DIAGNOSIS — I4819 Other persistent atrial fibrillation: Secondary | ICD-10-CM | POA: Insufficient documentation

## 2022-05-15 NOTE — Telephone Encounter (Signed)
Refill request

## 2022-05-15 NOTE — Telephone Encounter (Signed)
Prescription refill request for Eliquis received. Indication: a fib Last office visit: 08/15/21 Scr: overdue for labs Age: 82 Weight: 121kg

## 2022-05-16 NOTE — Telephone Encounter (Signed)
Eliquis 79m refill request received. Patient is 82years old, weight-121.1kg, Crea-0.94 on 03/07/2021-needs updated labs, Diagnosis-Afib, and last seen by Dr. GRockey Situon 08/15/2021. Dose is appropriate based on dosing criteria.   Spoke with pt and he states he is not out of eliquis at this time . [facility] Placed CBC & BMET on pt and released to have labs drawn. [facility]. He is aware since his refill is not urgent then we will send once labs are resulted and he verbalized understanding.

## 2022-05-18 ENCOUNTER — Other Ambulatory Visit: Payer: Self-pay | Admitting: Cardiovascular Disease

## 2022-05-18 DIAGNOSIS — I4819 Other persistent atrial fibrillation: Secondary | ICD-10-CM

## 2022-05-18 NOTE — Telephone Encounter (Signed)
Refill request

## 2022-05-18 NOTE — Addendum Note (Signed)
Addended by: Leonidas Romberg on: 05/18/2022 03:01 PM   Modules accepted: Orders

## 2022-05-18 NOTE — Telephone Encounter (Addendum)
Prescription refill request for Eliquis received. Indication: Afib  Last office visit: 08/15/21 Rockey Situ)  Scr: 0.94 (03/07/21)  Age: 82 Weight: 121.1kg  Labs overdue. [facility] Called and spoke with pt's wife, Inez Catalina who stated they went to Dr Donivan Scull office and there was no appt for labs. Per note on 05/14/22, [facility] was [facility]. Inez Catalina stated she could [facility] her [facility] and pt has enough Eliquis for 1 more week until labs are resulted.

## 2022-05-18 NOTE — Telephone Encounter (Signed)
*  STAT* If patient is at the pharmacy, call can be transferred to refill team.   1. Which medications need to be refilled? (please list name of each medication and dose if known) Eliquis 41m  2. Which pharmacy/location (including street and city if local pharmacy) is medication to be sent to? [facility], [place]   3. Do they need a 30 day or 90 day supply? 90 day supply

## 2022-05-22 ENCOUNTER — Other Ambulatory Visit
Admission: RE | Admit: 2022-05-22 | Discharge: 2022-05-22 | Disposition: A | Discharge status: Home / Self Care | Payer: Medicare Other | Attending: Cardiovascular Disease | Admitting: Cardiovascular Disease

## 2022-05-22 DIAGNOSIS — I4819 Other persistent atrial fibrillation: Secondary | ICD-10-CM | POA: Insufficient documentation

## 2022-05-22 LAB — CBC
HCT: 43.6 % (ref 39.0–52.0)
Hemoglobin: 14 g/dL (ref 13.0–17.0)
MCH: 28.4 pg (ref 26.0–34.0)
MCHC: 32.1 g/dL (ref 30.0–36.0)
MCV: 88.4 fL (ref 80.0–100.0)
Platelets: 189 10*3/uL (ref 150–400)
RBC: 4.93 MIL/uL (ref 4.22–5.81)
RDW: 15.2 % (ref 11.5–15.5)
WBC: 8.1 10*3/uL (ref 4.0–10.5)
nRBC: 0 % (ref 0.0–0.2)

## 2022-05-22 LAB — BASIC METABOLIC PANEL
Anion gap: 9 (ref 5–15)
BUN: 19 mg/dL (ref 8–23)
CO2: 22 mmol/L (ref 22–32)
Calcium: 9 mg/dL (ref 8.9–10.3)
Chloride: 110 mmol/L (ref 98–111)
Creatinine, Ser: 0.98 mg/dL (ref 0.61–1.24)
GFR, Estimated: >60 mL/min (ref >60)
Glucose, Bld: 110 mg/dL — ABNORMAL HIGH (ref 70–99)
Potassium: 4.2 mmol/L (ref 3.5–5.1)
Sodium: 141 mmol/L (ref 135–145)

## 2022-05-29 ENCOUNTER — Telehealth: Payer: Self-pay | Admitting: Pharmacist

## 2022-05-29 MED ORDER — APIXABAN 5 MG PO TABS: 5.0000 mg | ORAL_TABLET | Freq: Two times a day (BID) | ORAL | 1 refills | Status: DC

## 2022-05-29 NOTE — Telephone Encounter (Signed)
-----   Message from Meryl Crutch, RN sent at 05/29/2022 11:07 AM EST ----- Pt's wife made aware and stated pt need an updated prescription for Eliquis. Will forward to Pharm D

## 2022-08-15 ENCOUNTER — Ambulatory Visit: Payer: Medicare Other | Admitting: Cardiovascular Disease

## 2022-08-15 NOTE — Progress Notes (Unsigned)
Cardiology Office Note  Date:  08/16/2022   ID:  FAWKES PIVARNIK, DOB November 21, 1940, MRN [id number]  PCP:  Hannah Beat, MD   Chief Complaint  Patient presents with   Headache    Off balance, SOB, dizziness    HPI:  Mr. Clara is a very pleasant 82 year old gentleman with a history of  Stroke Permanent Atrial fibrillation CAD  stenting to his left circumflex, last catheterization in 2007 showing 40% proximal LAD disease, 70-80% mid RCA disease, nondominant vessel.  <39% b/l carotid disease in 2019 Previous ETOH abuse He presents for routine followup of his coronary artery disease, atrial fibrillation  Last seen in clinic by myself May 2023  Has not seen primary care in several years time Previously seen by urology, not for several years Presents today with his wife Several issues discussed today including disruptive Nocturia 4x a night  Reports that he drinks cocktails x 2 and drink with dinner Does not feel that fluid intake is excessive  Reports that he is not taking tamsulosin, may be prescription ran out Not on Gemtesa which is on his medication list from 2 years ago Not taking Zetia for unclear reasons  Having periodic headaches, typically takes Tylenol  Poor balance, no falls  Sedentary, sleeping more No regular activity or exercise program, no hobbies  Would like to go fishing, [place] Hard time getting in and out of boat   Denies shortness of breath or chest pain on exertion  No recent trips to the emergency room  EKG personally reviewed by myself on todays visit Shows atrial fibrillation rate 71 bpm poor R wave progression through the anterior precordial leads, intraventricular conduction delay  Other past medical history reviewed In the Er 07/17/2019, chest pain  5 weeks he has been experiencing intermittent discomfort in his left-sided chest and abdomen.  Troponin neg x 2 RUQ u/s normal  Other past medical history  reviewed Echocardiogram 10/2017  reviewed mildly depressed ejection fraction 45 to 50%  moderately dilated left atrium  Seen in the office with Dr. Dallas Schimke 10/10/2017 Had stroke type symptoms  speech, left hand and arm  MRI of the brain (10/17/17)  showed an acute infarction in the right occipital lobe Atrial fib noted on echo 11/01/2017 Started on eliquis 11/01/2017   PMH:   has a past medical history of ALLERGIC RHINITIS (04/22/2007), BENIGN PROSTATIC HYPERTROPHY, WITH URINARY OBSTRUCTION (06/25/2007), Cardiomegaly (05/30/2007), Carotid stenosis (01/29/2014), CERVICAL DISORDER, NOS (10/14/2009), CORONARY ARTERY DISEASE (12/03/2006), DIVERTICULOSIS OF COLON (05/20/2001), Dyshidrosis (06/25/2007), GERD (12/06/2007), HYPERLIPIDEMIA (12/03/2006), INTERNAL HEMORRHOIDS (05/20/2001), OBESITY, MODERATE (12/05/2008), OSTEOARTHRITIS (06/25/2007), and VENOUS INSUFFICIENCY (12/04/2006).  PSH:    Past Surgical History:  Procedure Laterality Date   ANGIOPLASTY     2 stents   FOOT SURGERY     JOINT REPLACEMENT     bilat knee   TONSILLECTOMY      Current Outpatient Medications  Medication Sig Dispense Refill   apixaban (ELIQUIS) 5 MG TABS tablet Take 1 tablet (5 mg total) by mouth 2 (two) times daily. 180 tablet 1   atorvastatin (LIPITOR) 40 MG tablet TAKE 1 TABLET BY MOUTH EVERY DAY 90 tablet 3   COVID-19 mRNA bivalent vaccine, Pfizer, (PFIZER COVID-19 VAC BIVALENT) injection Inject into the muscle. 0.3 mL 0   metoprolol succinate (TOPROL-XL) 25 MG 24 hr tablet Take 1 tablet (25 mg total) by mouth daily. Take with or immediately following a meal. 90 tablet 3   Multiple Vitamins-Minerals (CENTRUM SILVER  50+MEN) TABS Take by mouth daily.     Multiple Vitamins-Minerals (PRESERVISION AREDS) TABS Take by mouth. Take 1 tablet by mouth twice daily     nitroGLYCERIN (NITROSTAT) 0.4 MG SL tablet Place 1 tablet (0.4 mg total) under the tongue every 5 (five) minutes as needed. 25 tablet 6   ezetimibe (ZETIA) 10 MG tablet Take  1 tablet (10 mg total) by mouth daily. (Patient not taking: Reported on 08/16/2022) 90 tablet 3   tamsulosin (FLOMAX) 0.4 MG CAPS capsule Take 1 capsule (0.4 mg total) by mouth daily. (Patient not taking: Reported on 03/09/2022) 30 capsule 0   Vibegron (GEMTESA) 75 MG TABS Take 75 mg by mouth daily. (Patient not taking: Reported on 03/09/2022) 30 tablet 11   No current facility-administered medications for this visit.    Allergies:   Oxycodone-aspirin, Oxycodone, and Oxycodone-aspirin   Social History:  The patient  reports that he quit smoking about 42 years ago. His smoking use included cigarettes. He has never used smokeless tobacco. He reports current alcohol use. He reports that he does not use drugs.   Family History:   family history includes Colon cancer (age of onset: 76) in his mother.    Review of Systems: Review of Systems  Constitutional: Negative.   HENT: Negative.    Respiratory: Negative.    Cardiovascular: Negative.   Gastrointestinal: Negative.   Musculoskeletal: Negative.   Neurological: Negative.   Psychiatric/Behavioral: Negative.    All other systems reviewed and are negative.   PHYSICAL EXAM: VS:  BP 132/74 (BP Location: Left Arm, Patient Position: Sitting)   Pulse 71   Ht 5\' 7"  (1.702 m)   Wt 253 lb 6.4 oz (114.9 kg)   SpO2 95%   BMI 39.69 kg/m  , BMI Body mass index is 39.69 kg/m. Constitutional:  oriented to person, place, and time. No distress.  HENT:  Head: Grossly normal Eyes:  no discharge. No scleral icterus.  Neck: No JVD, no carotid bruits  Cardiovascular: Regular rate and rhythm, no murmurs appreciated Pulmonary/Chest: Clear to auscultation bilaterally, no wheezes or rails Abdominal: Soft.  no distension.  no tenderness.  Musculoskeletal: Normal range of motion Neurological:  normal muscle tone. Coordination normal. No atrophy Skin: Skin warm and dry Psychiatric: normal affect, pleasant   Recent Labs: 05/22/2022: BUN 19; Creatinine,  Ser 0.98; Hemoglobin 14.0; Platelets 189; Potassium 4.2; Sodium 141    Lipid Panel Lab Results  Component Value Date   CHOL 156 08/15/2021   HDL 52 08/15/2021   LDLCALC 83 08/15/2021   TRIG 105 08/15/2021    Wt Readings from Last 3 Encounters:  08/16/22 253 lb 6.4 oz (114.9 kg)  03/09/22 267 lb (121.1 kg)  08/15/21 267 lb 6 oz (121.3 kg)     ASSESSMENT AND PLAN:  Persistent atrial fibrillation Tolerating Eliquis twice daily Continue metoprolol succinate 25 mg daily, rate well-controlled  Hyperlipidemia LDL goal <70 - Recommend he restart Zetia 10 mg daily with his Lipitor 40 daily  Essential hypertension - Plan: EKG 12-Lead Orthostatics negative in the office today, he reported having dizziness Significant leg weakness, gait instability, periodic headaches, nocturia, poor sleep  Coronary artery disease involving native coronary artery of native heart without angina pectoris -  Currently with no symptoms of angina. No further workup at this time. Continue current medication regimen.  Stenosis of right carotid artery - Plan: EKG 12-Lead 1-39% stenosis bilaterally August 2019 Continue Lipitor Restart Zetia  Severe obesity (BMI >= 40) (HCC) - Plan: EKG  12-Lead Sedentary, poor diet at baseline, weight continues to run high No hobbies  Polyuria/nocturia Likely disrupting his sleep at nighttime, we will restart tamsulosin, recommend he reestablish with urology.  Previous on [place], he is not on this currently He does not feel several cocktails at night and beverage with dinner is not exacerbating his urine output overnight Sleeping more, fatigue likely contributing to lack of motivation to do things during the daytime    Total encounter time more than 40 minutes  Greater than 50% was spent in counseling and coordination of care with the patient    No orders of the defined types were placed in this encounter.    Signed, Dossie Arbour, M.D., Ph.D. 08/16/2022  [facility]  [facility] [place], [place] [phone]

## 2022-08-16 ENCOUNTER — Ambulatory Visit: Payer: Medicare Other | Attending: Cardiovascular Disease | Admitting: Cardiovascular Disease

## 2022-08-16 ENCOUNTER — Encounter: Payer: Self-pay | Admitting: Cardiovascular Disease

## 2022-08-16 VITALS — BP 132/74 | HR 71 | Ht 67.0 in | Wt 253.4 lb

## 2022-08-16 DIAGNOSIS — I739 Peripheral vascular disease, unspecified: Secondary | ICD-10-CM | POA: Diagnosis not present

## 2022-08-16 DIAGNOSIS — I4819 Other persistent atrial fibrillation: Secondary | ICD-10-CM | POA: Diagnosis not present

## 2022-08-16 DIAGNOSIS — I6310 Cerebral infarction due to embolism of unspecified precerebral artery: Secondary | ICD-10-CM | POA: Diagnosis not present

## 2022-08-16 DIAGNOSIS — E785 Hyperlipidemia, unspecified: Secondary | ICD-10-CM | POA: Diagnosis not present

## 2022-08-16 DIAGNOSIS — I25118 Atherosclerotic heart disease of native coronary artery with other forms of angina pectoris: Secondary | ICD-10-CM

## 2022-08-16 DIAGNOSIS — I10 Essential (primary) hypertension: Secondary | ICD-10-CM | POA: Diagnosis not present

## 2022-08-16 DIAGNOSIS — R351 Nocturia: Secondary | ICD-10-CM

## 2022-08-16 MED ORDER — EZETIMIBE 10 MG PO TABS: 10.0000 mg | ORAL_TABLET | Freq: Every day | ORAL | 3 refills | Status: DC

## 2022-08-16 MED ORDER — TAMSULOSIN HCL 0.4 MG PO CAPS: 0.4000 mg | ORAL_CAPSULE | Freq: Every day | ORAL | 1 refills | Status: DC

## 2022-08-16 NOTE — Patient Instructions (Addendum)
Referral to urology (male doctor) for nocturia, prostate   Medication Instructions:  No changes  If you need a refill on your cardiac medications before your next appointment, please call your pharmacy.   Lab work: No new labs needed  Testing/Procedures: No new testing needed  Follow-Up: At [facility], you and your health needs are our priority.  As part of our continuing mission to provide you with exceptional heart care, we have created designated Provider Care Teams.  These Care Teams include your primary Cardiologist (physician) and Advanced Practice Providers (APPs -  Physician Assistants and Nurse Practitioners) who all work together to provide you with the care you need, when you need it.  You will need a follow up appointment in 12 months  Providers on your designated Care Team:   Nicolasa Ducking, NP Eula Listen, PA-C Cadence Fransico Michael, [place]  COVID-19 Vaccine Information can be found at: [email or web address] For questions related to vaccine distribution or appointments, please email [email or web address] or call [phone].

## 2022-09-11 ENCOUNTER — Other Ambulatory Visit: Payer: Self-pay | Admitting: Cardiovascular Disease

## 2022-09-12 ENCOUNTER — Encounter: Payer: Self-pay | Admitting: Cardiovascular Disease

## 2022-09-12 NOTE — Telephone Encounter (Signed)
Error

## 2022-11-21 ENCOUNTER — Ambulatory Visit: Payer: Medicare Other | Admitting: Podiatry

## 2022-11-21 ENCOUNTER — Encounter: Payer: Self-pay | Admitting: Podiatry

## 2022-11-21 DIAGNOSIS — B351 Tinea unguium: Secondary | ICD-10-CM | POA: Diagnosis not present

## 2022-11-21 DIAGNOSIS — M79675 Pain in left toe(s): Secondary | ICD-10-CM

## 2022-11-21 DIAGNOSIS — M79674 Pain in right toe(s): Secondary | ICD-10-CM | POA: Diagnosis not present

## 2022-11-21 DIAGNOSIS — M7751 Other enthesopathy of right foot: Secondary | ICD-10-CM

## 2022-11-21 MED ORDER — BETAMETHASONE SOD PHOS & ACET 6 (3-3) MG/ML IJ SUSP
3.0000 mg | Freq: Once | INTRAMUSCULAR | Status: AC
  Administered 2022-11-21: 3 mg via INTRA_ARTICULAR

## 2022-11-21 NOTE — Progress Notes (Signed)
   Chief Complaint  Patient presents with   Nail Problem    "Trim his toenails." N - toenails L - 1-5 bilateral D - weeks O - gradually worse C - long A - none T - wife stated she attempted but doesn't see well.    SUBJECTIVE Patient presents to office today complaining of elongated, thickened nails that cause pain while ambulating in shoes.  Patient is unable to trim their own nails. Patient is here for further evaluation and treatment.  Past Medical History:  Diagnosis Date   ALLERGIC RHINITIS 04/22/2007   BENIGN PROSTATIC HYPERTROPHY, WITH URINARY OBSTRUCTION 06/25/2007   Cardiomegaly 05/30/2007   Carotid stenosis 01/29/2014   CERVICAL DISORDER, NOS 10/14/2009   CORONARY ARTERY DISEASE 12/03/2006   DIVERTICULOSIS OF COLON 05/20/2001   Dyshidrosis 06/25/2007   GERD 12/06/2007   HYPERLIPIDEMIA 12/03/2006   INTERNAL HEMORRHOIDS 05/20/2001   OBESITY, MODERATE 12/05/2008   OSTEOARTHRITIS 06/25/2007   VENOUS INSUFFICIENCY 12/04/2006    Allergies  Allergen Reactions   Oxycodone-Aspirin Nausea And Vomiting   Oxycodone Nausea And Vomiting   Oxycodone-Aspirin Nausea And Vomiting     OBJECTIVE General Patient is awake, alert, and oriented x 3 and in no acute distress. Derm Skin is dry and supple bilateral. Negative open lesions or macerations. Remaining integument unremarkable. Nails are tender, long, thickened and dystrophic with subungual debris, consistent with onychomycosis, 1-5 bilateral. No signs of infection noted. Vasc  DP and PT pedal pulses palpable bilaterally. Temperature gradient within normal limits.  Neuro grossly intact via light touch Musculoskeletal Exam flatfoot deformity with collapse of the medial longitudinal arch noted on weightbearing bilateral.  There is pain and tenderness associated to the anterolateral aspect of the right ankle joint  ASSESSMENT 1.  Pain due to onychomycosis of toenails both 2.  Capsulitis lateral aspect of the right ankle 3.  Pes planovalgus  deformity bilateral  PLAN OF CARE -Patient evaluated today.  -Instructed to maintain good pedal hygiene and foot care.  -Mechanical debridement of nails 1-5 bilaterally performed using a nail nipper. Filed with dremel without incident.  -Injection 0.5 cc Celestone Soluspan injected into the lateral aspect of the right ankle joint -Return to clinic 3 months routine footcare   Felecia Shelling, DPM [facility]  Dr. Felecia Shelling, DPM    [address] [place], [place] [address]                Office [phone]  Fax [phone]

## 2023-01-07 ENCOUNTER — Other Ambulatory Visit: Payer: Self-pay | Admitting: Cardiovascular Disease

## 2023-01-08 ENCOUNTER — Telehealth: Payer: Self-pay | Admitting: Cardiovascular Disease

## 2023-01-08 NOTE — Telephone Encounter (Signed)
Pt c/o medication issue:  1. Name of Medication: Eliquis  2. How are you currently taking this medication (dosage and times per day)?   3. Are you having a reaction (difficulty breathing--STAT)?   4. What is your medication issue? Patient said when he went to get the medicine today, it was $440. He said, he can not pay that much for it

## 2023-01-08 NOTE — Telephone Encounter (Signed)
Prescription refill request for Eliquis received. Indication:afib Last office visit:5/24 Scr:0.98  2/24 Age: 82 Weight:114.9  kg  Prescription refilled

## 2023-01-08 NOTE — Telephone Encounter (Signed)
Spoke w/ pt's wife and advised her of Hunter Young's recommendations. She states that she was unaware that pt had called and "does not know why he bothered y'all.  He doesn't qualify for any of the assistance, and as long as it keeps him healthy, we can afford it". Advised her that I will send the previous message via MyChart so that she will have it in writing and can discuss w/ him. Asked her to call back if he would like to make any changes.

## 2023-01-08 NOTE — Telephone Encounter (Signed)
Hunter Young, RPH  Marilynne Halsted, RN Caller: Unspecified (Today,  1:22 PM) Here are the options Eliquis: You can apply for patient assistance through [employer] patient assistance. The website for the application is http://www.wilson-[email or web address]/. The website can tell you if you meet the income requirement. Note to Medicare Patients: In addition to your application, you will need to submit documentation showing you have spent 3% of your annual household income on out-of-pocket prescription expenses for you and/or other members of your household for the year in which you are seeking assistance. Your pharmacy can provide this report.  Xarelto: Xarelto is a medication similar to Eliquis, but it is taken just once a day. The drug company offers a program called Xarelto with Me Coverage Gap Support. It runs from April 1-December 31. If you are in the coverage gap, you can get Xarelto for $89 (for 30 days) or $250 (for 90 days), plus sales tax if applicable. You can call to enroll at: 888-XARELTO 716-681-0821)  Dabigatran: Dabigatran is the generic of Pradaxa (it recently went generic). Like Eliquis and Xarelto, it does not require a lot of monitoring. You might consider contacting your insurance to see what the cost of this medication is through your insurance. There is also a GoodRx coupon for ~$69-75 per month if your insurance does not cover it well.  Warfarin: Warfarin is very inexpensive but does require more monitoring. You will need to come to clinic for weekly INR (blood test) checks with our Anticoagulation Clinic for the first month. These visits may have a copay. Then as we find a stable dose for you, you will start to come less often. You will be required to have your INR checked at least every 6 weeks. There are also more drug interactions and dietary interactions with warfarin.

## 2023-01-10 ENCOUNTER — Telehealth: Payer: Self-pay | Admitting: Cardiovascular Disease

## 2023-01-10 NOTE — Telephone Encounter (Signed)
Pt c/o medication issue:  1. Name of Medication: ELIQUIS 5 MG TABS tablet   2. How are you currently taking this medication (dosage and times per day)?    3. Are you having a reaction (difficulty breathing--STAT)? no  4. What is your medication issue? Medication is too expensive calling to, see what other options there are. Please advise

## 2023-01-10 NOTE — Telephone Encounter (Signed)
Spoke w/ pt's wife and advised her that I sent pt a MyChart message yesterday, but it has not been viewed yet. She apologized that he keeps calling, she is at the grocery store and "can't watch him every second". Reassured her that it is no problem at all and not to worry about it, he is not bothering [place]. Asked her to read over the info I sent yesterday and let us know their decision. She is appreciative of the call.

## 2023-03-01 ENCOUNTER — Encounter: Payer: Self-pay | Admitting: Podiatry

## 2023-03-01 ENCOUNTER — Ambulatory Visit: Payer: Medicare Other | Admitting: Podiatry

## 2023-03-01 DIAGNOSIS — M79675 Pain in left toe(s): Secondary | ICD-10-CM | POA: Diagnosis not present

## 2023-03-01 DIAGNOSIS — B351 Tinea unguium: Secondary | ICD-10-CM

## 2023-03-01 DIAGNOSIS — M79674 Pain in right toe(s): Secondary | ICD-10-CM

## 2023-03-01 NOTE — Progress Notes (Signed)
  Subjective:  Patient ID: Hunter Young, male    DOB: 1940/09/14,  MRN: [id number]  82 y.o. male presents painful elongated mycotic toenails 1-5 bilaterally which are tender when wearing enclosed shoe gear. Pain is relieved with periodic professional debridement. He is accompanied by his wife on today's visit. Chief Complaint  Patient presents with   Nail Problem    "I think I'm here for a nail cutting."   New problem(s): None   PCP is Copland, Karleen Hampshire, MD.  Allergies  Allergen Reactions   Oxycodone-Aspirin Nausea And Vomiting   Oxycodone Nausea And Vomiting   Oxycodone-Aspirin Nausea And Vomiting    Review of Systems: Negative except as noted in the HPI.   Objective:  Hunter Young is a pleasant 82 y.o. male obese in NAD. AAO x 3.  Vascular Examination: Vascular status intact b/l with palpable pedal pulses. CFT immediate b/l. Pedal hair present. No edema. No pain with calf compression b/l. Skin temperature gradient WNL b/l. No varicosities noted. No cyanosis or clubbing noted.  Neurological Examination: Sensation grossly intact b/l with 10 gram monofilament. Vibratory sensation intact b/l.  Dermatological Examination: Pedal skin with normal turgor, texture and tone b/l. No open wounds nor interdigital macerations noted. Toenails 1-5 b/l thick, discolored, elongated with subungual debris and pain on dorsal palpation. No hyperkeratotic lesions noted b/l.   Musculoskeletal Examination: Muscle strength 5/5 to b/l LE.  No pain, crepitus noted b/l. HAV with bunion deformity noted b/l LE. Pes planus deformity noted bilateral LE.  Last A1c:       No data to display           Assessment:   1. Pain due to onychomycosis of toenails of both feet    Plan:  Patient was evaluated and treated. All patient's and/or POA's questions/concerns addressed on today's visit. Toenails 1-5 debrided in length and girth without incident. Continue soft, supportive shoe gear daily. Report any pedal  injuries to medical professional. Call office if there are any questions/concerns. -Dispensed tube foam. Apply to left great toe or left 2nd toe  every morning. Remove every evening. -Patient/POA to call should there be question/concern in the interim.  Return in about 3 months (around 05/30/2023).  Freddie Breech, DPM      [place] LOCATION: [address] [place] [address]                   Office [phone]   [facility] LOCATION: [address] [place], [place] [address] Office [phone]

## 2023-04-16 DIAGNOSIS — Z85828 Personal history of other malignant neoplasm of skin: Secondary | ICD-10-CM | POA: Insufficient documentation

## 2023-04-17 ENCOUNTER — Telehealth: Payer: Self-pay | Admitting: Cardiovascular Disease

## 2023-04-17 NOTE — Telephone Encounter (Signed)
Pt c/o medication issue:  1. Name of Medication:   ELIQUIS 5 MG TABS tablet   2. How are you currently taking this medication (dosage and times per day)?  Yes  3. Are you having a reaction (difficulty breathing--STAT)?   No  4. What is your medication issue?   Patient stated this medication is very expensive and he wants to know if he can cut back on this medication to 1 tablet a day.

## 2023-04-17 NOTE — Telephone Encounter (Signed)
Called and spoke with patient. Patient states that his Eliquis was $302 for 30 day supply. Patient states that he has applied for patient assistance in the past and did not qualify. Patient wants to know if there is another medication that he could take. Will forward to MD for review.

## 2023-04-20 NOTE — Telephone Encounter (Signed)
Left voicemail message to call back

## 2023-04-20 NOTE — Telephone Encounter (Signed)
[place], [place] - 04/17/2023  1:58 PM Antonieta Iba, MD  Sent: Caleen Essex April 20, 2023  8:16 AM   Message  Only other substitute would be warfarin which we would not recommend  I suspect he is in the donut hole which typically one needs to pay the first $500  Perhaps he can get on a payment plan to spread the cost out over the year  Thx  TGollan

## 2023-04-23 NOTE — Telephone Encounter (Signed)
Left voicemail message to call back

## 2023-04-25 NOTE — Telephone Encounter (Signed)
Called patient and left message for call back.

## 2023-05-01 NOTE — Telephone Encounter (Signed)
Spoke with patients wife and she states they got it figured out and have no further needs.

## 2023-05-14 DIAGNOSIS — L570 Actinic keratosis: Secondary | ICD-10-CM | POA: Diagnosis not present

## 2023-05-14 DIAGNOSIS — L578 Other skin changes due to chronic exposure to nonionizing radiation: Secondary | ICD-10-CM | POA: Diagnosis not present

## 2023-05-14 DIAGNOSIS — C44321 Squamous cell carcinoma of skin of nose: Secondary | ICD-10-CM | POA: Diagnosis not present

## 2023-05-14 DIAGNOSIS — D492 Neoplasm of unspecified behavior of bone, soft tissue, and skin: Secondary | ICD-10-CM | POA: Diagnosis not present

## 2023-05-14 DIAGNOSIS — Z85828 Personal history of other malignant neoplasm of skin: Secondary | ICD-10-CM | POA: Diagnosis not present

## 2023-05-31 ENCOUNTER — Ambulatory Visit: Payer: Medicare Other | Admitting: Podiatry

## 2023-05-31 ENCOUNTER — Encounter: Payer: Self-pay | Admitting: Podiatry

## 2023-05-31 VITALS — Ht 67.0 in | Wt 253.4 lb

## 2023-05-31 DIAGNOSIS — M79674 Pain in right toe(s): Secondary | ICD-10-CM

## 2023-05-31 DIAGNOSIS — M79675 Pain in left toe(s): Secondary | ICD-10-CM | POA: Diagnosis not present

## 2023-05-31 DIAGNOSIS — B351 Tinea unguium: Secondary | ICD-10-CM

## 2023-05-31 NOTE — Progress Notes (Signed)
  Subjective:  Patient ID: Hunter Young, male    DOB: 1940/08/06,  MRN: [id number]  83 y.o. male presents painful, elongated thickened toenails x 10 which are symptomatic when wearing enclosed shoe gear. This interferes with his/her daily activities. He is accompanied by his wife on today's visit. Relates left great toe is a little sore. Chief Complaint  Patient presents with   Nail Problem    Pt is here for [facility] PCP is Dr Patsy Lager and pt states he has not seen him in a couple of years.   New problem(s): None   PCP is Copland, Spencer, MD.  Allergies  Allergen Reactions   Oxycodone-Aspirin Nausea And Vomiting   Oxycodone Nausea And Vomiting   Oxycodone-Aspirin Nausea And Vomiting    Review of Systems: Negative except as noted in the HPI.   Objective:  Hunter Young is a pleasant 83 y.o. male obese in NAD. AAO x 3.  Vascular Examination: Vascular status intact b/l with palpable pedal pulses. CFT immediate b/l. Pedal hair present. No edema. No pain with calf compression b/l. Skin temperature gradient WNL b/l. No varicosities noted. No cyanosis or clubbing noted.  Neurological Examination: Sensation grossly intact b/l with 10 gram monofilament. Vibratory sensation intact b/l.  Dermatological Examination: Pedal skin with normal turgor, texture and tone b/l. No open wounds nor interdigital macerations noted. Toenails 1-5 b/l thick, discolored, elongated with subungual debris and pain on dorsal palpation. No hyperkeratotic lesions noted b/l.   Musculoskeletal Examination: Muscle strength 5/5 to b/l LE.  No pain, crepitus noted b/l. HAV with bunion deformity noted b/l LE. Pes planus deformity noted bilateral LE.   Radiographs: None  Last A1c:       No data to display           Assessment:   1. Pain due to onychomycosis of toenails of both feet    Plan:  Patient was evaluated and treated. All patient's and/or POA's questions/concerns addressed on today's visit. Mycotic  toenails 1-5 debrided in length and girth without incident. Patient noted pain relief post treatment. Continue soft, supportive shoe gear daily. Report any pedal injuries to medical professional. Call office if there are any quesitons/concerns. -Patient/POA to call should there be question/concern in the interim.  Return in about 3 months (around 08/31/2023).  Freddie Breech, DPM      [place] LOCATION: [address], [place] [address]                   Office [phone]   [facility] LOCATION: [address] [place], [place] [address] Office [phone]

## 2023-06-26 ENCOUNTER — Ambulatory Visit: Payer: Self-pay

## 2023-06-26 DIAGNOSIS — R040 Epistaxis: Secondary | ICD-10-CM | POA: Diagnosis not present

## 2023-06-26 DIAGNOSIS — Z885 Allergy status to narcotic agent status: Secondary | ICD-10-CM | POA: Diagnosis not present

## 2023-06-26 DIAGNOSIS — I2510 Atherosclerotic heart disease of native coronary artery without angina pectoris: Secondary | ICD-10-CM | POA: Diagnosis not present

## 2023-06-26 DIAGNOSIS — I509 Heart failure, unspecified: Secondary | ICD-10-CM | POA: Diagnosis not present

## 2023-06-26 DIAGNOSIS — I4891 Unspecified atrial fibrillation: Secondary | ICD-10-CM | POA: Diagnosis not present

## 2023-06-26 DIAGNOSIS — I110 Hypertensive heart disease with heart failure: Secondary | ICD-10-CM | POA: Diagnosis not present

## 2023-06-26 DIAGNOSIS — K219 Gastro-esophageal reflux disease without esophagitis: Secondary | ICD-10-CM | POA: Diagnosis not present

## 2023-06-26 DIAGNOSIS — Z7901 Long term (current) use of anticoagulants: Secondary | ICD-10-CM | POA: Diagnosis not present

## 2023-06-26 DIAGNOSIS — Z87891 Personal history of nicotine dependence: Secondary | ICD-10-CM | POA: Diagnosis not present

## 2023-06-26 DIAGNOSIS — E785 Hyperlipidemia, unspecified: Secondary | ICD-10-CM | POA: Diagnosis not present

## 2023-06-26 NOTE — Telephone Encounter (Signed)
  Chief Complaint: epistaxis Symptoms: bleeding with blood clots from nose Frequency: x 8 hours Pertinent Negatives: Patient denies cold symptoms, allergies, LOC, lightheaded Disposition: [x] ED /[] Urgent Care (no appt availability in office) / [] Appointment(In office/virtual)/ []  [facility]/ [] Home Care/ [] Refused Recommended Disposition /[] [facility]/ []  Follow-up with PCP Additional Notes: Patient began with nosebleed this morning aroun d0300. Wife states there was a large blood clot that came out before the paramedics were called around 0900. Patient's BP was 160/84 according to wife when paramedics took it this morning. She states the gauze dressing is currently bloody and she is changing and applying a new dressing. She states the paramedics sprayed Afrin on some gauze and applied a dressing. Advised patient go to ED due to being on Eliquis and severe bleeding that has not stopped. Wife states she would like to wait and hour and check on the bleeding. Advised that patient should go straight to ED.  Copied from CRM [id number]. Topic: Clinical - Red Word Triage >> Jun 26, 2023 11:08 AM Denese Killings wrote: Red Word that prompted transfer to Nurse Triage: Patient had an excessive nose bleed this morning and the ambulance was called. Patient wife is concerned and wants to know that to do next. Reason for Disposition  [1] Bleeding present > 30 minutes AND [2] using correct method of direct pressure  Answer Assessment - Initial Assessment Questions 1. AMOUNT OF BLEEDING: "How bad is the bleeding?" "How much blood was lost?" "Has the bleeding stopped?"   - MILD: needed a couple tissues   - MODERATE: needed many tissues   - SEVERE: large blood clots, soaked many tissues, lasted more than 30 minutes      Wife states the bleeding has not stopped since 0200 this morning, she states it is not running right now but has soaked thru some gauze. Huge blood clot came out before paramedics  arrived.  2. ONSET: "When did the nosebleed start?"      Today around 0300. Paramedics arrived around 0900 this morning and applied rolled up gauze with Afrin on it which helped but did not stop.  3. FREQUENCY: "How many nosebleeds have you had in the last 24 hours?"      Just this one instance.  4. RECURRENT SYMPTOMS: "Have there been other recent nosebleeds?" If Yes, ask: "How long did it take you to stop the bleeding?" "What worked best?"      Denies.  5. CAUSE: "What do you think caused this nosebleed?"     Unsure.  6. LOCAL FACTORS: "Do you have any cold symptoms?", "Have you been rubbing or picking at your nose?"     Denies.  7. SYSTEMIC FACTORS: "Do you have high blood pressure or any bleeding problems?"     High blood pressure.  8. BLOOD THINNERS: "Do you take any blood thinners?" (e.g., aspirin, clopidogrel / Plavix, coumadin, heparin). Notes: Other strong blood thinners include: Arixtra (fondaparinux), Eliquis (apixaban), Pradaxa (dabigatran), and Xarelto (rivaroxaban).     Eliquis.  9. OTHER SYMPTOMS: "Do you have any other symptoms?" (e.g., lightheadedness)     Denies.  10. PREGNANCY: "Is there any chance you are pregnant?" "When was your last menstrual period?"       N/A.  Protocols used: Nosebleed-A-AH

## 2023-06-27 ENCOUNTER — Telehealth: Payer: Self-pay | Admitting: Cardiovascular Disease

## 2023-06-27 NOTE — Telephone Encounter (Signed)
 Pt c/o medication issue:  1. Name of Medication: ELIQUIS 5 MG TABS tablet   2. How are you currently taking this medication (dosage and times per day)? As written  3. Are you having a reaction (difficulty breathing--STAT)? No  4. What is your medication issue? Pts wife would like a c/b in regards to this medication states pt is having nose bleeds and doesn't know if its due to taking this medication. Please advise

## 2023-06-27 NOTE — Telephone Encounter (Signed)
 I called wife, advised that he had a nose bleed all day yesterday- they were able to get it stopped for a short time but it came back and they went to [facility] (visit in epic system for review). They did not pack the nose, patient had no symptoms to report (lightheadedness, dizziness). Did not have any bleeding this morning.   I advised patient wife to continue with medications at this time until we heard back from [facility], however wife states she is NOT giving him his eliquis today or until they hear back from Dr.Gollan- I did advise against this, explaining risk, and recommended that we wait to hear from Dr.Gollan if okay to stop and for how many days. Patient wife again stated that she is going to hold it until she heard from him-   Advised I would send a message to MD for review.

## 2023-07-02 NOTE — Telephone Encounter (Signed)
 Called and notified patient of the following from Dr. Mariah Milling.  Would recommend he restart Eliquis given it has been several days and it should have healed For recurrence of nosebleed, may need to see ENT to have  nose cauterized Would avoid heavy blowing of nose, consider using saline nasal spray daily Thx TGollan   Patient verbalizes understanding.

## 2023-07-15 ENCOUNTER — Other Ambulatory Visit: Payer: Self-pay | Admitting: Cardiovascular Disease

## 2023-07-15 DIAGNOSIS — I4819 Other persistent atrial fibrillation: Secondary | ICD-10-CM

## 2023-07-16 NOTE — Telephone Encounter (Signed)
 Prescription refill request for Eliquis  received. Indication:afib Last office visit:5/24 ZOX:WRUEA labs Age: 83 Weight:114.9  kg  Prescription refilled

## 2023-07-24 DIAGNOSIS — I4819 Other persistent atrial fibrillation: Secondary | ICD-10-CM | POA: Diagnosis not present

## 2023-07-25 LAB — CBC WITH DIFFERENTIAL/PLATELET
Basophils Absolute: 0 10*3/uL (ref 0.0–0.2)
Basos: 1 %
EOS (ABSOLUTE): 0.1 10*3/uL (ref 0.0–0.4)
Eos: 1 %
Hematocrit: 43.9 % (ref 37.5–51.0)
Hemoglobin: 13.5 g/dL (ref 13.0–17.7)
Immature Grans (Abs): 0.1 10*3/uL (ref 0.0–0.1)
Immature Granulocytes: 1 %
Lymphocytes Absolute: 2 10*3/uL (ref 0.7–3.1)
Lymphs: 27 %
MCH: 28.5 pg (ref 26.6–33.0)
MCHC: 30.8 g/dL — ABNORMAL LOW (ref 31.5–35.7)
MCV: 93 fL (ref 79–97)
Monocytes Absolute: 0.6 10*3/uL (ref 0.1–0.9)
Monocytes: 8 %
Neutrophils Absolute: 4.5 10*3/uL (ref 1.4–7.0)
Neutrophils: 62 %
Platelets: 183 10*3/uL (ref 150–450)
RBC: 4.73 x10E6/uL (ref 4.14–5.80)
RDW: 13.4 % (ref 11.6–15.4)
WBC: 7.3 10*3/uL (ref 3.4–10.8)

## 2023-07-25 LAB — BASIC METABOLIC PANEL WITH GFR
BUN/Creatinine Ratio: 14 (ref 10–24)
BUN: 14 mg/dL (ref 8–27)
CO2: 22 mmol/L (ref 20–29)
Calcium: 9.5 mg/dL (ref 8.6–10.2)
Chloride: 103 mmol/L (ref 96–106)
Creatinine, Ser: 0.97 mg/dL (ref 0.76–1.27)
Glucose: 118 mg/dL — ABNORMAL HIGH (ref 70–99)
Potassium: 4.1 mmol/L (ref 3.5–5.2)
Sodium: 140 mmol/L (ref 134–144)
eGFR: 77 mL/min/{1.73_m2} (ref >59)

## 2023-08-31 ENCOUNTER — Ambulatory Visit: Admitting: Podiatry

## 2023-09-07 ENCOUNTER — Other Ambulatory Visit: Payer: Self-pay | Admitting: Cardiovascular Disease

## 2023-09-07 NOTE — Telephone Encounter (Signed)
 Pt called and made appt for 10/02/23 with Dr Gollan please refill until then

## 2023-09-07 NOTE — Telephone Encounter (Signed)
 Prescription refill request for Eliquis  received. Indication:afib Last office visit:needs appt Scr:0.97  4/25 Age: 83 Weight:114.9  kg  Prescription refilled

## 2023-09-17 ENCOUNTER — Other Ambulatory Visit: Payer: Self-pay | Admitting: Cardiovascular Disease

## 2023-09-17 ENCOUNTER — Encounter: Payer: Self-pay | Admitting: Podiatry

## 2023-09-17 ENCOUNTER — Ambulatory Visit: Admitting: Podiatry

## 2023-09-17 DIAGNOSIS — M79674 Pain in right toe(s): Secondary | ICD-10-CM | POA: Diagnosis not present

## 2023-09-17 DIAGNOSIS — B351 Tinea unguium: Secondary | ICD-10-CM | POA: Diagnosis not present

## 2023-09-17 DIAGNOSIS — M79675 Pain in left toe(s): Secondary | ICD-10-CM

## 2023-09-22 NOTE — Progress Notes (Signed)
  Subjective:  Patient ID: Hunter Young, male    DOB: 1940/05/30,  MRN: [id number]  83 y.o. male presents painful thick toenails that are difficult to trim. Pain interferes with ambulation. Aggravating factors include wearing enclosed shoe gear. Pain is relieved with periodic professional debridement.  Chief Complaint  Patient presents with   RFC    Rm4 Rfc not diabetic/ Dr. Watt last visit    New problem(s): None   PCP is Copland, Jacques, MD.  Allergies  Allergen Reactions   Oxycodone-Aspirin Nausea And Vomiting   Oxycodone Nausea And Vomiting   Oxycodone-Aspirin Nausea And Vomiting    Review of Systems: Negative except as noted in the HPI.   Objective:  Hunter Young is a pleasant 83 y.o. male obese in NAD. AAO x 3.  Vascular Examination: Vascular status intact b/l with palpable pedal pulses. CFT immediate b/l. Pedal hair present. No edema. No pain with calf compression b/l. Skin temperature gradient WNL b/l. No varicosities noted. No cyanosis or clubbing noted.  Neurological Examination: Sensation grossly intact b/l with 10 gram monofilament. Vibratory sensation intact b/l.  Dermatological Examination: Pedal skin with normal turgor, texture and tone b/l. No open wounds nor interdigital macerations noted. Toenails 1-5 b/l thick, discolored, elongated with subungual debris and pain on dorsal palpation. No hyperkeratotic lesions noted b/l.   Musculoskeletal Examination: Muscle strength 5/5 to b/l LE.  No pain, crepitus noted b/l. HAV with bunion deformity noted b/l LE. Pes planus deformity noted bilateral LE.  Radiographs: None Assessment:   1. Pain due to onychomycosis of toenails of both feet    Plan:  Consent given for treatment. Patient examined. All patient's and/or POA's questions/concerns addressed on today's visit.Toenails 1-5 debrided in length and girth without incident. Continue soft, supportive shoe gear daily. Report any pedal injuries to medical  professional. Call office if there are any questions/concerns. -Patient/POA to call should there be question/concern in the interim.  Return in about 3 months (around 12/18/2023).  Hunter Young, DPM      [facility] LOCATION: [address] [place] [address]                   Office [phone]   [facility] LOCATION: [address] [place], [place] [address] Office [phone]
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
## 2023-10-02 ENCOUNTER — Ambulatory Visit: Attending: Cardiovascular Disease | Admitting: Cardiovascular Disease

## 2023-10-02 ENCOUNTER — Encounter: Payer: Self-pay | Admitting: Cardiovascular Disease

## 2023-10-02 VITALS — BP 138/82 | HR 71 | Ht 69.0 in | Wt 251.1 lb

## 2023-10-02 DIAGNOSIS — I10 Essential (primary) hypertension: Secondary | ICD-10-CM

## 2023-10-02 DIAGNOSIS — I739 Peripheral vascular disease, unspecified: Secondary | ICD-10-CM | POA: Diagnosis not present

## 2023-10-02 DIAGNOSIS — I4819 Other persistent atrial fibrillation: Secondary | ICD-10-CM | POA: Diagnosis not present

## 2023-10-02 DIAGNOSIS — I6310 Cerebral infarction due to embolism of unspecified precerebral artery: Secondary | ICD-10-CM | POA: Diagnosis not present

## 2023-10-02 DIAGNOSIS — I25118 Atherosclerotic heart disease of native coronary artery with other forms of angina pectoris: Secondary | ICD-10-CM

## 2023-10-02 DIAGNOSIS — R351 Nocturia: Secondary | ICD-10-CM

## 2023-10-02 DIAGNOSIS — E785 Hyperlipidemia, unspecified: Secondary | ICD-10-CM | POA: Diagnosis not present

## 2023-10-02 MED ORDER — ATORVASTATIN CALCIUM 40 MG PO TABS: ORAL_TABLET | ORAL | 3 refills | Status: DC

## 2023-10-02 MED ORDER — METOPROLOL SUCCINATE ER 25 MG PO TB24: 25.0000 mg | ORAL_TABLET | Freq: Every day | ORAL | 3 refills | Status: DC

## 2023-10-02 MED ORDER — TAMSULOSIN HCL 0.4 MG PO CAPS: 0.4000 mg | ORAL_CAPSULE | Freq: Every day | ORAL | 3 refills | Status: DC

## 2023-10-02 MED ORDER — EZETIMIBE 10 MG PO TABS: 10.0000 mg | ORAL_TABLET | Freq: Every day | ORAL | 3 refills | Status: AC

## 2023-10-02 MED ORDER — APIXABAN 5 MG PO TABS: 5.0000 mg | ORAL_TABLET | Freq: Two times a day (BID) | ORAL | 1 refills | Status: AC

## 2023-10-02 NOTE — Patient Instructions (Signed)

## 2023-10-08 ENCOUNTER — Encounter: Payer: Self-pay | Admitting: Cardiovascular Disease

## 2023-10-08 ENCOUNTER — Other Ambulatory Visit: Payer: Self-pay | Admitting: *Deleted

## 2023-10-08 MED ORDER — METOPROLOL SUCCINATE ER 25 MG PO TB24: 25.0000 mg | ORAL_TABLET | Freq: Every day | ORAL | 3 refills | Status: AC

## 2023-10-09 DIAGNOSIS — L578 Other skin changes due to chronic exposure to nonionizing radiation: Secondary | ICD-10-CM | POA: Diagnosis not present

## 2023-10-09 DIAGNOSIS — L988 Other specified disorders of the skin and subcutaneous tissue: Secondary | ICD-10-CM | POA: Diagnosis not present

## 2023-10-09 DIAGNOSIS — C44321 Squamous cell carcinoma of skin of nose: Secondary | ICD-10-CM | POA: Diagnosis not present

## 2023-10-09 DIAGNOSIS — Z85828 Personal history of other malignant neoplasm of skin: Secondary | ICD-10-CM | POA: Diagnosis not present

## 2023-10-09 DIAGNOSIS — C44329 Squamous cell carcinoma of skin of other parts of face: Secondary | ICD-10-CM | POA: Diagnosis not present

## 2023-10-09 DIAGNOSIS — S30860A Insect bite (nonvenomous) of lower back and pelvis, initial encounter: Secondary | ICD-10-CM | POA: Diagnosis not present

## 2023-10-09 DIAGNOSIS — W57XXXA Bitten or stung by nonvenomous insect and other nonvenomous arthropods, initial encounter: Secondary | ICD-10-CM | POA: Diagnosis not present

## 2023-12-31 ENCOUNTER — Ambulatory Visit: Admitting: Podiatry

## 2023-12-31 DIAGNOSIS — Z91199 Patient's noncompliance with other medical treatment and regimen due to unspecified reason: Secondary | ICD-10-CM

## 2023-12-31 NOTE — Progress Notes (Signed)
 1. No-show for appointment

## 2024-01-31 ENCOUNTER — Telehealth: Payer: Self-pay

## 2024-01-31 ENCOUNTER — Encounter: Payer: Self-pay | Admitting: Pharmacist

## 2024-01-31 NOTE — Telephone Encounter (Signed)
 Patient is overdue for an appointment. LMOM for patient to call and schedule.

## 2024-01-31 NOTE — Progress Notes (Signed)
 Pharmacy Quality Measure Review  This patient is appearing on a report for being at risk of failing the adherence measure for cholesterol (statin) medications this calendar year.   Medication: atorvastatin  40 mg Last fill date: 10/02/23 for 90 day supply  MyChart message sent to patient. Unable to reach pharmacy. Hold >10 min

## 2024-02-05 ENCOUNTER — Other Ambulatory Visit: Payer: Self-pay | Admitting: Cardiovascular Disease

## 2024-02-08 NOTE — Telephone Encounter (Signed)
 Patient is on the [facility] list as not completing a PCP visit for 2025. PCP listed is Copland, Jacques, MD.   Number listed for patient is not a valid number. Unable to contact patient.

## 2024-02-26 ENCOUNTER — Encounter: Payer: Self-pay | Admitting: Pharmacist

## 2024-02-26 NOTE — Progress Notes (Signed)
 Pharmacy Quality Measure Review  This patient is appearing on a report for being at risk of failing the adherence measure for cholesterol (statin) medications this calendar year.   Medication: atorvastatin  40 mg Last fill date: 10/02/23 for 90 day supply  Medication has been refilled by cardiology and picked up by patient as of 02/05/24 x90 ds. 88%.

## 2024-04-14 ENCOUNTER — Ambulatory Visit: Payer: Self-pay

## 2024-04-14 NOTE — Telephone Encounter (Signed)
 FYI Only or Action Required?: FYI only for provider: appointment scheduled on 1/21.   Called Nurse Triage reporting Back Pain.  Symptoms began several months ago.  Interventions attempted: OTC medications: Tylenol.  Symptoms are: gradually worsening.  Triage Disposition: See PCP When Office is Open (Within 3 Days)  Patient/caregiver understands and will follow disposition?: Yes            Message from [facility] sent at 04/14/2024 12:24 PM EST  Reason for Triage: Severe back pain, affecting his mobility. Wife is concerned.   Reason for Disposition  [1] MODERATE back pain (e.g., interferes with normal activities) AND [2] present > 3 days  Answer Assessment - Initial Assessment Questions 1. ONSET: When did the pain begin? (e.g., minutes, hours, days)     X 2-3 months   2. LOCATION: Where does it hurt? (upper, mid or lower back)     Lower  3. SEVERITY: How bad is the pain?  (e.g., Scale 1-10; mild, moderate, or severe)     When ambulation pain is severe, with sitting it is mild   4. PATTERN: Is the pain constant? (e.g., yes, no; constant, intermittent)      Intermittent   5. RADIATION: Does the pain shoot into your legs or somewhere else?     No   6. CAUSE:  What do you think is causing the back pain?      Unsure   7. BACK OVERUSE:  Any recent lifting of heavy objects, strenuous work or exercise?     No   8. MEDICINES: What have you taken so far for the pain? (e.g., nothing, acetaminophen, NSAIDS)     Tylenol   9. NEUROLOGIC SYMPTOMS: Do you have any weakness, numbness, or problems with bowel/bladder control?     No   10. OTHER SYMPTOMS: Do you have any other symptoms? (e.g., fever, abdomen pain, burning with urination, blood in urine)       No      Patient's wife called in to triage with complaints of lower back pain in patient. This has been ongoing for 2-3 months; suspects it is arthritis.  The wife stated his ambulation is being  affected; pain worsens when walking..  For home care, the patient is taking Tylenol.  Appointment scheduled for further evaluation; Patient agrees with the plan of care, and will reach out if symptoms worsen or persist.  Protocols used: Back Pain-A-AH

## 2024-04-14 NOTE — Telephone Encounter (Signed)
 Patent scheduled for appointment 1/21

## 2024-04-14 NOTE — Progress Notes (Unsigned)
" ° ° ° °  Hunter Young T. Hunter Fedewa, MD, CAQ Sports Medicine [facility] [address] [place], [address]  Phone: [phone]  FAX: [phone]  Hunter Young - 84 y.o. male  MRN [id number]  Date of Birth: April 19, 1940  Date: 04/16/2024  PCP: Watt Mirza, MD  Referral: Watt Mirza, MD  No chief complaint on file.  Subjective:   Hunter Young is a 84 y.o. very pleasant male patient with There is no height or weight on file to calculate BMI. who presents with the following:  Discussed the use of AI scribe software for clinical note transcription with the patient, who gave verbal consent to proceed.  The patient used to be my primary care patient.  I actually have not seen him in more than 4 years.  He presents with some ongoing back pain. History of Present Illness     Review of Systems is noted in the HPI, as appropriate  Objective:   There were no vitals taken for this visit.  GEN: No acute distress; alert,appropriate. PULM: Breathing comfortably in no respiratory distress PSYCH: Normally interactive.   Laboratory and Imaging Data:  Assessment and Plan:   No diagnosis found. Assessment & Plan   Medication Management during today's office visit: No orders of the defined types were placed in this encounter.  There are no discontinued medications.  Orders placed today for conditions managed today: No orders of the defined types were placed in this encounter.   Disposition: No follow-ups on file.  [employer] speech-to-text software was used for transcription in this dictation.  Possible transcriptional errors can occur using Animal nutritionist.   Signed,  Mirza DASEN. Hunter Riederer, MD   Outpatient Encounter Medications as of 04/16/2024  Medication Sig   apixaban  (ELIQUIS ) 5 MG TABS tablet Take 1 tablet (5 mg total) by mouth 2 (two) times daily.   atorvastatin  (LIPITOR) 40 MG tablet TAKE 1 TABLET BY MOUTH EVERY DAY    COVID-19 mRNA bivalent vaccine, Pfizer, (PFIZER COVID-19 VAC BIVALENT) injection Inject into the muscle. (Patient not taking: Reported on 10/02/2023)   ezetimibe  (ZETIA ) 10 MG tablet Take 1 tablet (10 mg total) by mouth daily.   metoprolol  succinate (TOPROL -XL) 25 MG 24 hr tablet Take 1 tablet (25 mg total) by mouth daily. Take with or immediately following a meal.   Multiple Vitamins-Minerals (CENTRUM SILVER 50+MEN) TABS Take by mouth daily.   Multiple Vitamins-Minerals (PRESERVISION AREDS) TABS Take by mouth. Take 1 tablet by mouth twice daily   nitroGLYCERIN  (NITROSTAT ) 0.4 MG SL tablet Place 1 tablet (0.4 mg total) under the tongue every 5 (five) minutes as needed.   tamsulosin  (FLOMAX ) 0.4 MG CAPS capsule Take 1 capsule (0.4 mg total) by mouth daily.   Vibegron  (GEMTESA ) 75 MG TABS Take 75 mg by mouth daily. (Patient not taking: Reported on 10/02/2023)   No facility-administered encounter medications on file as of 04/16/2024.   "

## 2024-04-16 ENCOUNTER — Ambulatory Visit (INDEPENDENT_AMBULATORY_CARE_PROVIDER_SITE_OTHER): Admitting: Family Medicine

## 2024-04-16 ENCOUNTER — Ambulatory Visit (INDEPENDENT_AMBULATORY_CARE_PROVIDER_SITE_OTHER)
Admission: RE | Admit: 2024-04-16 | Discharge: 2024-04-16 | Disposition: A | Discharge status: Home / Self Care | Source: Ambulatory Visit | Attending: Family Medicine | Admitting: Family Medicine

## 2024-04-16 VITALS — BP 120/90 | HR 63 | Temp 97.3°F | Ht 69.0 in | Wt 235.4 lb

## 2024-04-16 DIAGNOSIS — M545 Low back pain, unspecified: Secondary | ICD-10-CM | POA: Diagnosis not present

## 2024-04-16 DIAGNOSIS — E785 Hyperlipidemia, unspecified: Secondary | ICD-10-CM

## 2024-04-16 DIAGNOSIS — Z79899 Other long term (current) drug therapy: Secondary | ICD-10-CM | POA: Diagnosis not present

## 2024-04-16 DIAGNOSIS — Z8673 Personal history of transient ischemic attack (TIA), and cerebral infarction without residual deficits: Secondary | ICD-10-CM

## 2024-04-16 DIAGNOSIS — I4819 Other persistent atrial fibrillation: Secondary | ICD-10-CM | POA: Diagnosis not present

## 2024-04-16 DIAGNOSIS — R739 Hyperglycemia, unspecified: Secondary | ICD-10-CM

## 2024-04-16 DIAGNOSIS — F0390 Unspecified dementia without behavioral disturbance: Secondary | ICD-10-CM | POA: Diagnosis not present

## 2024-04-16 DIAGNOSIS — I639 Cerebral infarction, unspecified: Secondary | ICD-10-CM | POA: Diagnosis not present

## 2024-04-16 DIAGNOSIS — G8929 Other chronic pain: Secondary | ICD-10-CM

## 2024-04-16 DIAGNOSIS — M51379 Other intervertebral disc degeneration, lumbosacral region without mention of lumbar back pain or lower extremity pain: Secondary | ICD-10-CM

## 2024-04-16 DIAGNOSIS — I700 Atherosclerosis of aorta: Secondary | ICD-10-CM | POA: Diagnosis not present

## 2024-04-16 LAB — HEPATIC FUNCTION PANEL
ALT: 22 U/L (ref 3–53)
AST: 26 U/L (ref 5–37)
Albumin: 4.1 g/dL (ref 3.5–5.2)
Alkaline Phosphatase: 91 U/L (ref 39–117)
Bilirubin, Direct: 0.3 mg/dL (ref 0.1–0.3)
Total Bilirubin: 1 mg/dL (ref 0.2–1.2)
Total Protein: 7.3 g/dL (ref 6.0–8.3)

## 2024-04-16 LAB — LIPID PANEL
Cholesterol: 129 mg/dL (ref 28–200)
HDL: 53.9 mg/dL
LDL Cholesterol: 54 mg/dL (ref 10–99)
NonHDL: 75.43
Total CHOL/HDL Ratio: 2
Triglycerides: 106 mg/dL (ref 10.0–149.0)
VLDL: 21.2 mg/dL (ref 0.0–40.0)

## 2024-04-16 LAB — CBC WITH DIFFERENTIAL/PLATELET
Basophils Absolute: 0 K/uL (ref 0.0–0.1)
Basophils Relative: 0.2 % (ref 0.0–3.0)
Eosinophils Absolute: 0 K/uL (ref 0.0–0.7)
Eosinophils Relative: 0.3 % (ref 0.0–5.0)
HCT: 49 % (ref 39.0–52.0)
Hemoglobin: 15.7 g/dL (ref 13.0–17.0)
Lymphocytes Relative: 17.5 % (ref 12.0–46.0)
Lymphs Abs: 1.7 K/uL (ref 0.7–4.0)
MCHC: 32.1 g/dL (ref 30.0–36.0)
MCV: 88.7 fl (ref 78.0–100.0)
Monocytes Absolute: 1.1 K/uL — ABNORMAL HIGH (ref 0.1–1.0)
Monocytes Relative: 11.4 % (ref 3.0–12.0)
Neutro Abs: 6.9 K/uL (ref 1.4–7.7)
Neutrophils Relative %: 70.6 % (ref 43.0–77.0)
Platelets: 179 K/uL (ref 150.0–400.0)
RBC: 5.53 Mil/uL (ref 4.22–5.81)
RDW: 15.3 % (ref 11.5–15.5)
WBC: 9.7 K/uL (ref 4.0–10.5)

## 2024-04-16 LAB — TSH: TSH: 1.83 u[IU]/mL (ref 0.35–5.50)

## 2024-04-16 LAB — BASIC METABOLIC PANEL WITH GFR
BUN: 20 mg/dL (ref 6–23)
CO2: 26 meq/L (ref 19–32)
Calcium: 9.3 mg/dL (ref 8.4–10.5)
Chloride: 98 meq/L (ref 96–112)
Creatinine, Ser: 1.29 mg/dL (ref 0.40–1.50)
GFR: 50.47 mL/min — ABNORMAL LOW
Glucose, Bld: 99 mg/dL (ref 70–99)
Potassium: 3.6 meq/L (ref 3.5–5.1)
Sodium: 137 meq/L (ref 135–145)

## 2024-04-16 LAB — HEMOGLOBIN A1C: Hgb A1c MFr Bld: 6.2 % (ref 4.6–6.5)

## 2024-04-16 MED ORDER — PREDNISONE 20 MG PO TABS: ORAL_TABLET | ORAL | 0 refills | Status: AC

## 2024-04-17 ENCOUNTER — Ambulatory Visit: Payer: Self-pay | Admitting: Family Medicine

## 2024-04-17 ENCOUNTER — Encounter: Payer: Self-pay | Admitting: Family Medicine

## 2024-07-09 ENCOUNTER — Encounter: Admitting: Family Medicine
